# Patient Record
Sex: Female | Born: 1980 | Race: White | Hispanic: No | Smoking: Former smoker
Health system: Southern US, Community
[De-identification: ages and names within clinical notes are randomized; demographics above are authoritative.]

## PROBLEM LIST (undated history)

## (undated) DIAGNOSIS — F4310 Post-traumatic stress disorder, unspecified: Secondary | ICD-10-CM

## (undated) DIAGNOSIS — F32A Depression, unspecified: Secondary | ICD-10-CM

## (undated) DIAGNOSIS — F329 Major depressive disorder, single episode, unspecified: Secondary | ICD-10-CM

## (undated) DIAGNOSIS — F909 Attention-deficit hyperactivity disorder, unspecified type: Secondary | ICD-10-CM

## (undated) DIAGNOSIS — IMO0002 Reserved for concepts with insufficient information to code with codable children: Secondary | ICD-10-CM

## (undated) DIAGNOSIS — C801 Malignant (primary) neoplasm, unspecified: Secondary | ICD-10-CM

## (undated) DIAGNOSIS — F319 Bipolar disorder, unspecified: Secondary | ICD-10-CM

## (undated) DIAGNOSIS — F419 Anxiety disorder, unspecified: Secondary | ICD-10-CM

## (undated) DIAGNOSIS — M1990 Unspecified osteoarthritis, unspecified site: Secondary | ICD-10-CM

## (undated) DIAGNOSIS — F988 Other specified behavioral and emotional disorders with onset usually occurring in childhood and adolescence: Secondary | ICD-10-CM

## (undated) HISTORY — PX: COLONOSCOPY: SHX174

## (undated) HISTORY — PX: WISDOM TOOTH EXTRACTION: SHX21

---
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
## 2003-10-02 ENCOUNTER — Inpatient Hospital Stay (HOSPITAL_COMMUNITY): Admission: EM | Admit: 2003-10-02 | Discharge: 2003-10-06 | Payer: Self-pay | Admitting: Psychiatry

## 2003-10-09 ENCOUNTER — Emergency Department (HOSPITAL_COMMUNITY): Admission: EM | Admit: 2003-10-09 | Discharge: 2003-10-09 | Payer: Self-pay | Admitting: Emergency Medicine

## 2003-12-29 ENCOUNTER — Ambulatory Visit: Payer: Self-pay | Admitting: Family Medicine

## 2004-02-02 ENCOUNTER — Ambulatory Visit: Payer: Self-pay | Admitting: Family Medicine

## 2005-01-06 ENCOUNTER — Ambulatory Visit: Payer: Self-pay | Admitting: Psychiatry

## 2005-01-06 ENCOUNTER — Inpatient Hospital Stay (HOSPITAL_COMMUNITY): Admission: EM | Admit: 2005-01-06 | Discharge: 2005-01-09 | Payer: Self-pay | Admitting: Psychiatry

## 2005-10-29 ENCOUNTER — Ambulatory Visit: Payer: Self-pay | Admitting: Internal Medicine

## 2005-10-29 ENCOUNTER — Encounter: Payer: Self-pay | Admitting: Family Medicine

## 2005-10-29 ENCOUNTER — Inpatient Hospital Stay (HOSPITAL_COMMUNITY): Admission: EM | Admit: 2005-10-29 | Discharge: 2005-11-01 | Payer: Self-pay

## 2005-10-30 ENCOUNTER — Encounter (INDEPENDENT_AMBULATORY_CARE_PROVIDER_SITE_OTHER): Payer: Self-pay | Admitting: Cardiology

## 2005-12-10 ENCOUNTER — Ambulatory Visit: Payer: Self-pay | Admitting: Physician Assistant

## 2006-02-13 ENCOUNTER — Ambulatory Visit (HOSPITAL_COMMUNITY): Payer: Self-pay | Admitting: Psychiatry

## 2006-03-08 ENCOUNTER — Ambulatory Visit (HOSPITAL_COMMUNITY): Payer: Self-pay | Admitting: Psychiatry

## 2007-11-29 ENCOUNTER — Emergency Department (HOSPITAL_COMMUNITY): Admission: EM | Admit: 2007-11-29 | Discharge: 2007-11-29 | Payer: Self-pay | Admitting: Emergency Medicine

## 2008-09-16 ENCOUNTER — Emergency Department (HOSPITAL_COMMUNITY): Admission: EM | Admit: 2008-09-16 | Discharge: 2008-09-16 | Payer: Self-pay | Admitting: Emergency Medicine

## 2008-11-30 ENCOUNTER — Other Ambulatory Visit: Payer: Self-pay

## 2008-11-30 ENCOUNTER — Other Ambulatory Visit: Payer: Self-pay | Admitting: Emergency Medicine

## 2008-12-03 ENCOUNTER — Ambulatory Visit: Payer: Self-pay | Admitting: Psychiatry

## 2008-12-03 ENCOUNTER — Inpatient Hospital Stay (HOSPITAL_COMMUNITY): Admission: AD | Admit: 2008-12-03 | Discharge: 2008-12-14 | Payer: Self-pay | Admitting: Psychiatry

## 2009-02-16 ENCOUNTER — Ambulatory Visit (HOSPITAL_COMMUNITY): Admission: RE | Admit: 2009-02-16 | Discharge: 2009-02-16 | Payer: Self-pay | Admitting: Internal Medicine

## 2009-09-03 ENCOUNTER — Emergency Department (HOSPITAL_COMMUNITY): Admission: EM | Admit: 2009-09-03 | Discharge: 2009-09-03 | Payer: Self-pay | Admitting: Emergency Medicine

## 2009-10-06 ENCOUNTER — Emergency Department (HOSPITAL_COMMUNITY): Admission: EM | Admit: 2009-10-06 | Discharge: 2009-10-06 | Payer: Self-pay | Admitting: Emergency Medicine

## 2009-11-26 ENCOUNTER — Emergency Department (HOSPITAL_COMMUNITY): Admission: EM | Admit: 2009-11-26 | Discharge: 2009-11-26 | Payer: Self-pay | Admitting: Emergency Medicine

## 2010-04-19 LAB — COMPREHENSIVE METABOLIC PANEL
ALT: 21 U/L (ref 0–35)
Albumin: 4 g/dL (ref 3.5–5.2)
BUN: 15 mg/dL (ref 6–23)
Chloride: 105 mEq/L (ref 96–112)
Creatinine, Ser: 0.71 mg/dL (ref 0.4–1.2)
GFR calc non Af Amer: >60 mL/min (ref >60)
Potassium: 3.6 mEq/L (ref 3.5–5.1)
Sodium: 137 mEq/L (ref 135–145)
Total Protein: 6.8 g/dL (ref 6.0–8.3)

## 2010-04-19 LAB — CBC
HCT: 43.7 % (ref 36.0–46.0)
Hemoglobin: 14.7 g/dL (ref 12.0–15.0)
MCH: 27.8 pg (ref 26.0–34.0)
MCV: 82.7 fL (ref 78.0–100.0)
RBC: 5.28 MIL/uL — ABNORMAL HIGH (ref 3.87–5.11)
WBC: 9.7 10*3/uL (ref 4.0–10.5)

## 2010-04-19 LAB — DIFFERENTIAL
Eosinophils Absolute: 0.2 10*3/uL (ref 0.0–0.7)
Neutrophils Relative %: 73 % (ref 43–77)

## 2010-04-20 LAB — WET PREP, GENITAL: Trich, Wet Prep: NONE SEEN

## 2010-04-20 LAB — URINE MICROSCOPIC-ADD ON

## 2010-04-20 LAB — URINE CULTURE
Colony Count: 50000
Culture  Setup Time: 201109020536

## 2010-04-20 LAB — URINALYSIS, ROUTINE W REFLEX MICROSCOPIC
Bilirubin Urine: NEGATIVE
Hgb urine dipstick: NEGATIVE
Nitrite: NEGATIVE
Specific Gravity, Urine: 1.027 (ref 1.005–1.030)

## 2010-05-11 LAB — CBC
HCT: 42.7 % (ref 36.0–46.0)
Hemoglobin: 14.5 g/dL (ref 12.0–15.0)
MCV: 82.6 fL (ref 78.0–100.0)
Platelets: 288 10*3/uL (ref 150–400)
WBC: 8.1 10*3/uL (ref 4.0–10.5)

## 2010-05-11 LAB — RAPID URINE DRUG SCREEN, HOSP PERFORMED
Barbiturates: NOT DETECTED
Benzodiazepines: POSITIVE — AB
Cocaine: POSITIVE — AB
Opiates: NOT DETECTED

## 2010-05-11 LAB — BASIC METABOLIC PANEL
BUN: 13 mg/dL (ref 6–23)
CO2: 25 mEq/L (ref 19–32)
Calcium: 9.7 mg/dL (ref 8.4–10.5)
Chloride: 109 mEq/L (ref 96–112)
Creatinine, Ser: 0.89 mg/dL (ref 0.4–1.2)
GFR calc Af Amer: >60 mL/min (ref >60)
Glucose, Bld: 112 mg/dL — ABNORMAL HIGH (ref 70–99)
Sodium: 140 mEq/L (ref 135–145)

## 2010-05-11 LAB — DIFFERENTIAL
Basophils Relative: 1 % (ref 0–1)
Eosinophils Relative: 3 % (ref 0–5)
Lymphs Abs: 3.2 10*3/uL (ref 0.7–4.0)
Monocytes Relative: 9 % (ref 3–12)

## 2010-05-11 LAB — ACETAMINOPHEN LEVEL: Acetaminophen (Tylenol), Serum: <10 ug/mL — ABNORMAL LOW (ref 10–30)

## 2010-05-11 LAB — ETHANOL: Alcohol, Ethyl (B): <5 mg/dL (ref 0–10)

## 2010-05-13 LAB — POCT I-STAT, CHEM 8
BUN: 17 mg/dL (ref 6–23)
Calcium, Ion: 1.18 mmol/L (ref 1.12–1.32)
Hemoglobin: 17 g/dL — ABNORMAL HIGH (ref 12.0–15.0)
Sodium: 139 mEq/L (ref 135–145)
TCO2: 23 mmol/L (ref 0–100)

## 2010-05-13 LAB — URINALYSIS, ROUTINE W REFLEX MICROSCOPIC
Hgb urine dipstick: NEGATIVE
Nitrite: NEGATIVE
Protein, ur: 30 mg/dL — AB
Specific Gravity, Urine: >1.03 — ABNORMAL HIGH (ref 1.005–1.030)
Urobilinogen, UA: 0.2 mg/dL (ref 0.0–1.0)

## 2010-05-13 LAB — URINE MICROSCOPIC-ADD ON
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
## 2010-11-12 IMAGING — US US TRANSVAGINAL NON-OB
1 series · 13 of 25 positions shown · non-contrast
Comparison: Abdominal pelvic CT 10/29/2005.

CLINICAL DATA: Mid pelvic pain bilaterally for 4 days.



[Series 1: us transvaginal non-ob · 0.23mm/px · 13 of 44 slices shown]
[im 1/44]
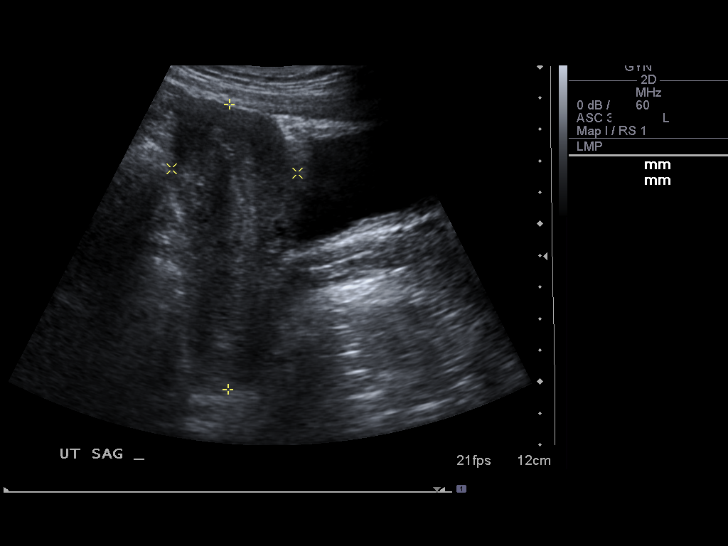
[im 4/44]
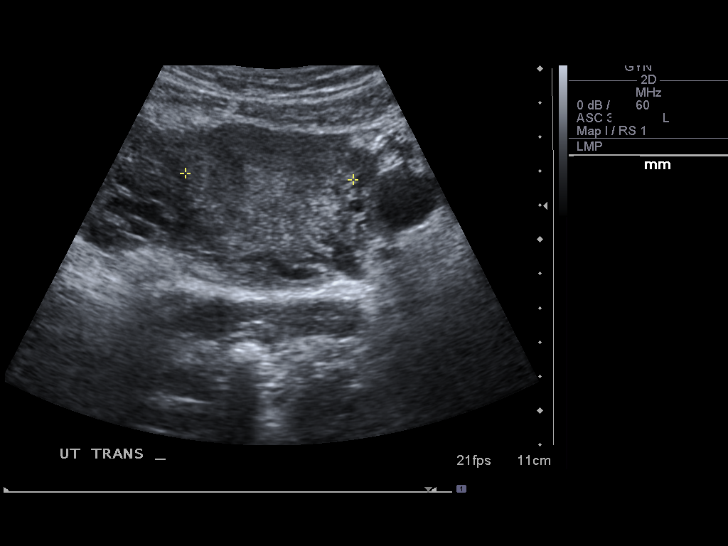
[im 8/44]
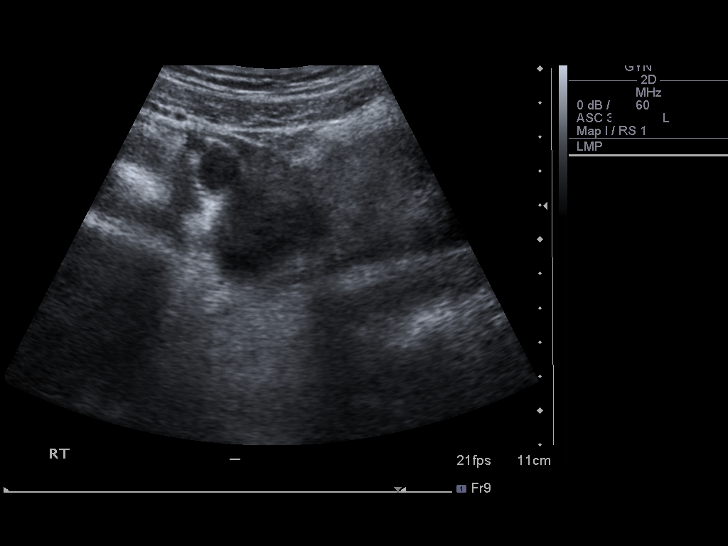
[im 11/44]
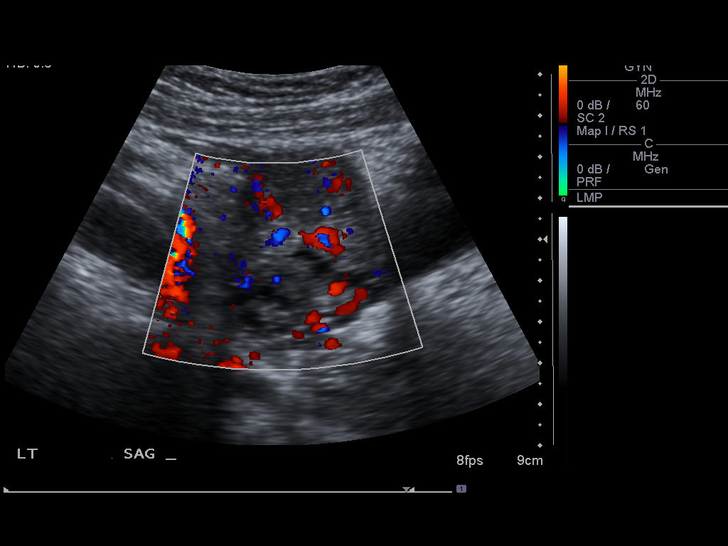
[im 15/44]
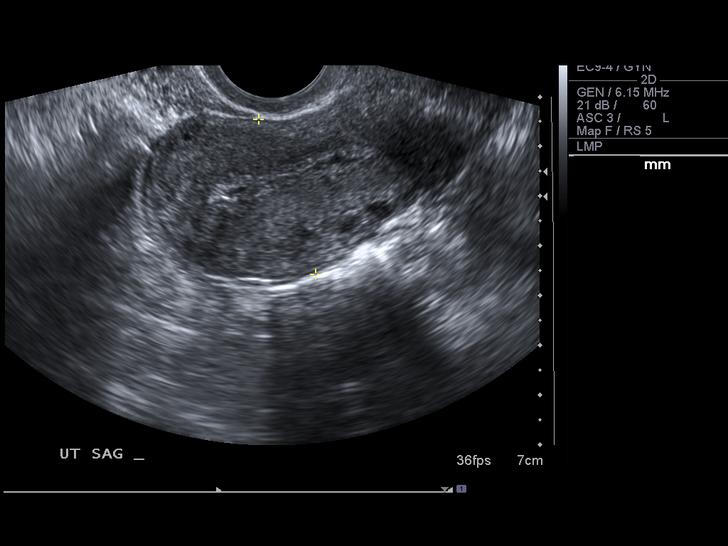
[im 18/44]
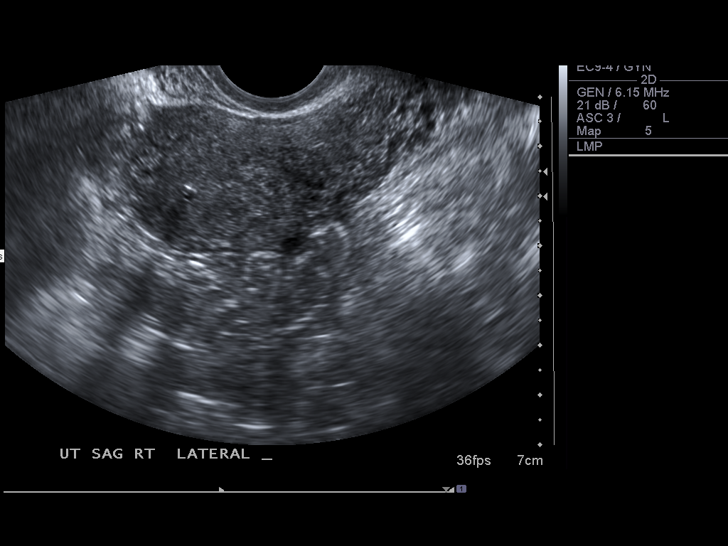
[im 22/44]
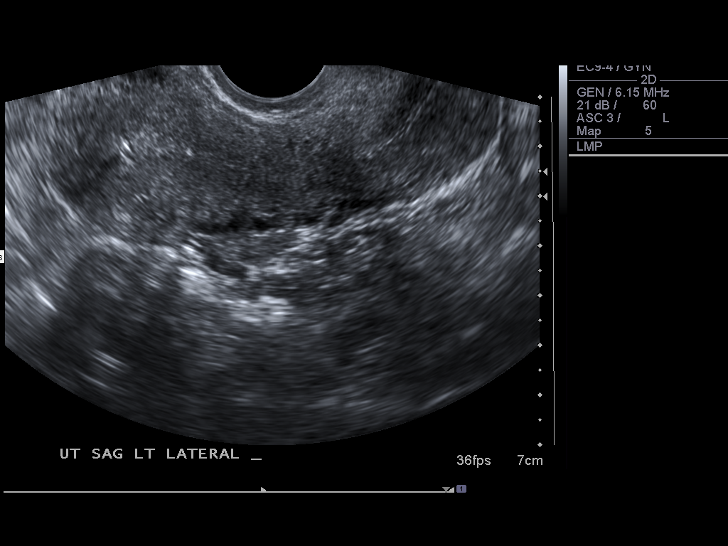
[im 26/44]
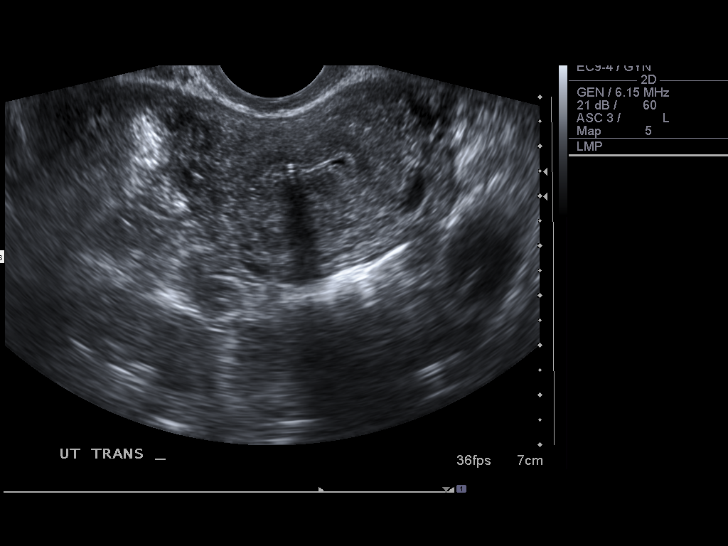
[im 29/44]
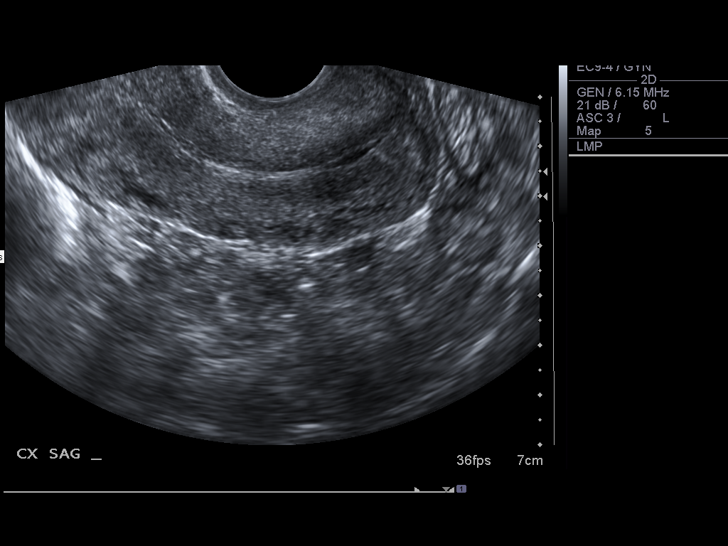
[im 33/44]
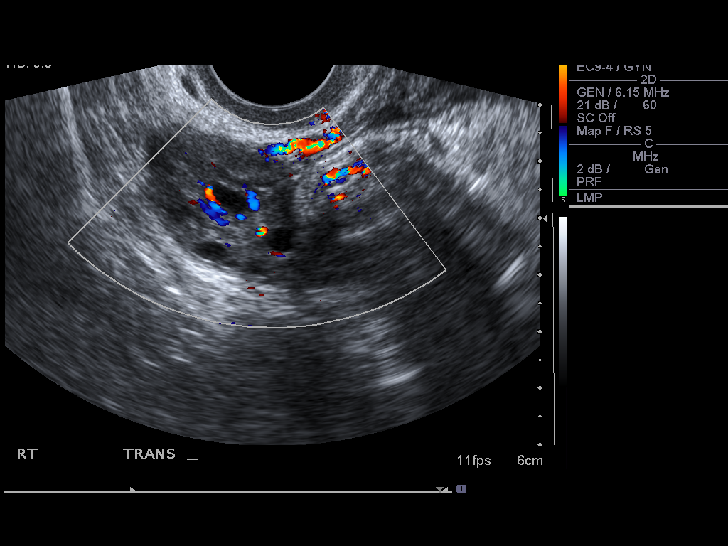
[im 36/44]
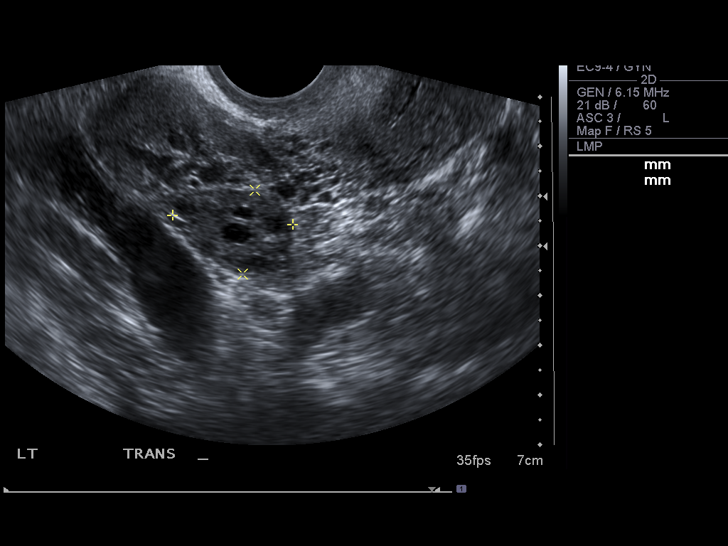
[im 40/44]
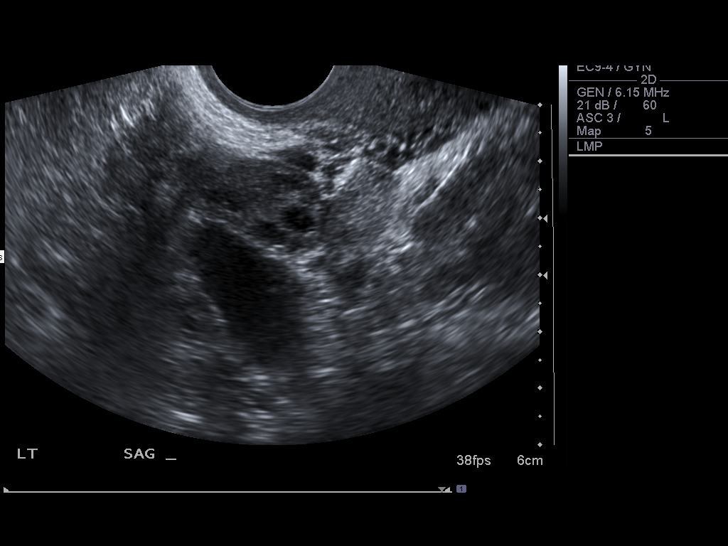
[im 44/44]
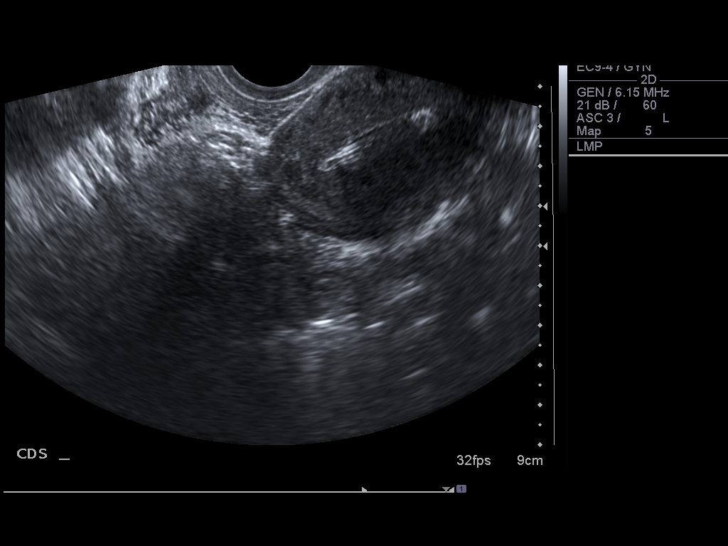

[13 of 25 positions shown; findings below may reference images not displayed]

FINDINGS: Uterus measures 9.0 x 4.0 x 4.8 cm.  The myometrium appears
homogeneous.

Endometrium measures 3 mm in thickness.  There is minimal fluid
within the endometrial canal.  The sonographer questioned the
presence of an intrauterine device.

Right Ovary appears normal, measuring 2.5 x 3.1 x 2.0 cm.  Blood
flow is demonstrated with color Doppler.

Left Ovary measures 3.6 x 1.9 x 2.0 cm and appears normal.  There
is blood flow with color Doppler.

Other Findings:  No adnexal mass is identified.  There is a small
amount of free pelvic fluid.
IMPRESSION: Normal pelvic ultrasound.  Correlate clinically regarding the
presence of an intrauterine device.

## 2011-06-25 ENCOUNTER — Ambulatory Visit (HOSPITAL_COMMUNITY)
Admission: RE | Admit: 2011-06-25 | Discharge: 2011-06-25 | Disposition: A | Discharge status: Home / Self Care | Payer: Medicaid Other | Attending: Psychiatry | Admitting: Psychiatry

## 2011-06-25 ENCOUNTER — Emergency Department (HOSPITAL_COMMUNITY)
Admission: EM | Admit: 2011-06-25 | Discharge: 2011-06-26 | Disposition: A | Discharge status: Other Acute Inpatient Hospital | Payer: 59 | Source: Home / Self Care | Attending: Emergency Medicine | Admitting: Emergency Medicine

## 2011-06-25 DIAGNOSIS — F3130 Bipolar disorder, current episode depressed, mild or moderate severity, unspecified: Secondary | ICD-10-CM | POA: Insufficient documentation

## 2011-06-25 DIAGNOSIS — F329 Major depressive disorder, single episode, unspecified: Secondary | ICD-10-CM

## 2011-06-25 DIAGNOSIS — F17200 Nicotine dependence, unspecified, uncomplicated: Secondary | ICD-10-CM | POA: Insufficient documentation

## 2011-06-25 DIAGNOSIS — R569 Unspecified convulsions: Secondary | ICD-10-CM | POA: Insufficient documentation

## 2011-06-25 DIAGNOSIS — F909 Attention-deficit hyperactivity disorder, unspecified type: Secondary | ICD-10-CM | POA: Insufficient documentation

## 2011-06-25 DIAGNOSIS — F1410 Cocaine abuse, uncomplicated: Secondary | ICD-10-CM | POA: Insufficient documentation

## 2011-06-25 DIAGNOSIS — F319 Bipolar disorder, unspecified: Secondary | ICD-10-CM | POA: Insufficient documentation

## 2011-06-25 DIAGNOSIS — F1910 Other psychoactive substance abuse, uncomplicated: Secondary | ICD-10-CM | POA: Insufficient documentation

## 2011-06-25 DIAGNOSIS — F1210 Cannabis abuse, uncomplicated: Secondary | ICD-10-CM | POA: Insufficient documentation

## 2011-06-25 HISTORY — DX: Major depressive disorder, single episode, unspecified: F32.9

## 2011-06-25 HISTORY — DX: Bipolar disorder, unspecified: F31.9

## 2011-06-25 HISTORY — DX: Other specified behavioral and emotional disorders with onset usually occurring in childhood and adolescence: F98.8

## 2011-06-25 HISTORY — DX: Attention-deficit hyperactivity disorder, unspecified type: F90.9

## 2011-06-25 HISTORY — DX: Depression, unspecified: F32.A

## 2011-06-25 HISTORY — DX: Post-traumatic stress disorder, unspecified: F43.10

## 2011-06-25 LAB — URINALYSIS, ROUTINE W REFLEX MICROSCOPIC
Glucose, UA: NEGATIVE mg/dL
Hgb urine dipstick: NEGATIVE
Specific Gravity, Urine: 1.014 (ref 1.005–1.030)
pH: 7 (ref 5.0–8.0)

## 2011-06-25 LAB — CBC
MCH: 26.9 pg (ref 26.0–34.0)
MCV: 83 fL (ref 78.0–100.0)
Platelets: 328 10*3/uL (ref 150–400)
RBC: 5.35 MIL/uL — ABNORMAL HIGH (ref 3.87–5.11)
RDW: 13.5 % (ref 11.5–15.5)
WBC: 9.4 10*3/uL (ref 4.0–10.5)

## 2011-06-25 LAB — PREGNANCY, URINE: Preg Test, Ur: NEGATIVE

## 2011-06-25 LAB — URINE MICROSCOPIC-ADD ON
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
## 2011-06-26 ENCOUNTER — Telehealth (HOSPITAL_COMMUNITY): Payer: Self-pay | Admitting: Licensed Clinical Social Worker

## 2011-06-26 ENCOUNTER — Encounter (HOSPITAL_COMMUNITY): Payer: Self-pay | Admitting: *Deleted

## 2011-06-26 ENCOUNTER — Encounter (HOSPITAL_COMMUNITY): Payer: Self-pay | Admitting: Emergency Medicine

## 2011-06-26 ENCOUNTER — Inpatient Hospital Stay (HOSPITAL_COMMUNITY)
Admission: AD | Admit: 2011-06-26 | Discharge: 2011-07-02 | DRG: 885 | Disposition: A | Discharge status: Home / Self Care | Payer: 59 | Source: Ambulatory Visit | Attending: Psychiatry | Admitting: Psychiatry

## 2011-06-26 DIAGNOSIS — F1410 Cocaine abuse, uncomplicated: Secondary | ICD-10-CM

## 2011-06-26 DIAGNOSIS — F17200 Nicotine dependence, unspecified, uncomplicated: Secondary | ICD-10-CM | POA: Diagnosis present

## 2011-06-26 DIAGNOSIS — F4310 Post-traumatic stress disorder, unspecified: Secondary | ICD-10-CM | POA: Diagnosis present

## 2011-06-26 DIAGNOSIS — F609 Personality disorder, unspecified: Secondary | ICD-10-CM | POA: Diagnosis present

## 2011-06-26 DIAGNOSIS — IMO0002 Reserved for concepts with insufficient information to code with codable children: Secondary | ICD-10-CM | POA: Diagnosis present

## 2011-06-26 DIAGNOSIS — F909 Attention-deficit hyperactivity disorder, unspecified type: Secondary | ICD-10-CM | POA: Diagnosis present

## 2011-06-26 DIAGNOSIS — F1920 Other psychoactive substance dependence, uncomplicated: Secondary | ICD-10-CM | POA: Diagnosis present

## 2011-06-26 DIAGNOSIS — F1210 Cannabis abuse, uncomplicated: Secondary | ICD-10-CM

## 2011-06-26 DIAGNOSIS — F1910 Other psychoactive substance abuse, uncomplicated: Secondary | ICD-10-CM

## 2011-06-26 DIAGNOSIS — F39 Unspecified mood [affective] disorder: Principal | ICD-10-CM | POA: Diagnosis present

## 2011-06-26 DIAGNOSIS — M1990 Unspecified osteoarthritis, unspecified site: Secondary | ICD-10-CM | POA: Diagnosis present

## 2011-06-26 DIAGNOSIS — F19939 Other psychoactive substance use, unspecified with withdrawal, unspecified: Secondary | ICD-10-CM | POA: Diagnosis present

## 2011-06-26 DIAGNOSIS — F3160 Bipolar disorder, current episode mixed, unspecified: Secondary | ICD-10-CM

## 2011-06-26 HISTORY — DX: Unspecified osteoarthritis, unspecified site: M19.90

## 2011-06-26 HISTORY — DX: Bipolar disorder, unspecified: F31.9

## 2011-06-26 HISTORY — DX: Reserved for concepts with insufficient information to code with codable children: IMO0002

## 2011-06-26 LAB — BASIC METABOLIC PANEL
Calcium: 9.5 mg/dL (ref 8.4–10.5)
Creatinine, Ser: 0.84 mg/dL (ref 0.50–1.10)
GFR calc Af Amer: >90 mL/min (ref >90)
Sodium: 136 mEq/L (ref 135–145)

## 2011-06-26 LAB — RAPID URINE DRUG SCREEN, HOSP PERFORMED
Cocaine: POSITIVE — AB
Opiates: NOT DETECTED

## 2011-06-26 MED ORDER — TRAZODONE HCL 100 MG PO TABS
100.0000 mg | ORAL_TABLET | Freq: Every evening | ORAL | Status: DC | PRN
  Administered 2011-06-26 – 2011-07-01 (×5): 100 mg via ORAL
  Filled 2011-06-26 (×5): qty 1

## 2011-06-26 MED ORDER — ALUM & MAG HYDROXIDE-SIMETH 200-200-20 MG/5ML PO SUSP
30.0000 mL | ORAL | Status: DC | PRN
  Administered 2011-07-02: 30 mL via ORAL

## 2011-06-26 MED ORDER — ADULT MULTIVITAMIN W/MINERALS CH
1.0000 | ORAL_TABLET | Freq: Every day | ORAL | Status: DC
  Administered 2011-06-27 – 2011-07-02 (×6): 1 via ORAL
  Filled 2011-06-26 (×8): qty 1

## 2011-06-26 MED ORDER — ONDANSETRON 4 MG PO TBDP: 4.0000 mg | ORAL_TABLET | Freq: Four times a day (QID) | ORAL | Status: AC | PRN

## 2011-06-26 MED ORDER — LOPERAMIDE HCL 2 MG PO CAPS
2.0000 mg | ORAL_CAPSULE | ORAL | Status: AC | PRN
Start: 2011-06-26 — End: 2011-06-29

## 2011-06-26 MED ORDER — ONDANSETRON HCL 4 MG PO TABS: 4.0000 mg | ORAL_TABLET | Freq: Three times a day (TID) | ORAL | Status: DC | PRN

## 2011-06-26 MED ORDER — NICOTINE 21 MG/24HR TD PT24: 21.0000 mg | MEDICATED_PATCH | Freq: Once | TRANSDERMAL | Status: DC

## 2011-06-26 MED ORDER — ALPRAZOLAM 0.25 MG PO TABS
0.2500 mg | ORAL_TABLET | Freq: Three times a day (TID) | ORAL | Status: DC | PRN
  Administered 2011-06-26 (×2): 0.25 mg via ORAL
  Filled 2011-06-26 (×3): qty 1

## 2011-06-26 MED ORDER — ZOLPIDEM TARTRATE 5 MG PO TABS: 5.0000 mg | ORAL_TABLET | Freq: Every evening | ORAL | Status: DC | PRN

## 2011-06-26 MED ORDER — MAGNESIUM HYDROXIDE 400 MG/5ML PO SUSP: 30.0000 mL | Freq: Every day | ORAL | Status: DC | PRN

## 2011-06-26 MED ORDER — ACETAMINOPHEN 325 MG PO TABS: 650.0000 mg | ORAL_TABLET | ORAL | Status: DC | PRN

## 2011-06-26 MED ORDER — CHLORDIAZEPOXIDE HCL 25 MG PO CAPS
25.0000 mg | ORAL_CAPSULE | Freq: Three times a day (TID) | ORAL | Status: AC
  Administered 2011-06-28 – 2011-06-29 (×3): 25 mg via ORAL
  Filled 2011-06-26 (×2): qty 1

## 2011-06-26 MED ORDER — NICOTINE 21 MG/24HR TD PT24
21.0000 mg | MEDICATED_PATCH | Freq: Every day | TRANSDERMAL | Status: DC
  Administered 2011-06-26: 21 mg via TRANSDERMAL
  Filled 2011-06-26: qty 1

## 2011-06-26 MED ORDER — VITAMIN B-1 100 MG PO TABS
100.0000 mg | ORAL_TABLET | Freq: Every day | ORAL | Status: DC
  Administered 2011-06-27 – 2011-07-02 (×6): 100 mg via ORAL
  Filled 2011-06-26 (×8): qty 1

## 2011-06-26 MED ORDER — CHLORDIAZEPOXIDE HCL 25 MG PO CAPS
25.0000 mg | ORAL_CAPSULE | Freq: Four times a day (QID) | ORAL | Status: AC | PRN
  Administered 2011-06-27 – 2011-06-28 (×2): 25 mg via ORAL
  Filled 2011-06-26 (×3): qty 1

## 2011-06-26 MED ORDER — ACETAMINOPHEN 325 MG PO TABS: 650.0000 mg | ORAL_TABLET | Freq: Four times a day (QID) | ORAL | Status: DC | PRN

## 2011-06-26 MED ORDER — IBUPROFEN 600 MG PO TABS: 600.0000 mg | ORAL_TABLET | Freq: Three times a day (TID) | ORAL | Status: DC | PRN

## 2011-06-26 MED ORDER — CHLORDIAZEPOXIDE HCL 25 MG PO CAPS
25.0000 mg | ORAL_CAPSULE | Freq: Every day | ORAL | Status: AC
  Administered 2011-07-01: 25 mg via ORAL
  Filled 2011-06-26 (×3): qty 1

## 2011-06-26 MED ORDER — HYDROXYZINE HCL 25 MG PO TABS
25.0000 mg | ORAL_TABLET | Freq: Four times a day (QID) | ORAL | Status: AC | PRN
  Administered 2011-06-26 – 2011-06-29 (×6): 25 mg via ORAL
  Filled 2011-06-26 (×5): qty 1

## 2011-06-26 MED ORDER — THIAMINE HCL 100 MG/ML IJ SOLN: 100.0000 mg | Freq: Once | INTRAMUSCULAR | Status: DC

## 2011-06-26 MED ORDER — CHLORDIAZEPOXIDE HCL 25 MG PO CAPS
25.0000 mg | ORAL_CAPSULE | Freq: Once | ORAL | Status: AC
  Administered 2011-06-26: 25 mg via ORAL
  Filled 2011-06-26: qty 1

## 2011-06-26 MED ORDER — CHLORDIAZEPOXIDE HCL 25 MG PO CAPS
25.0000 mg | ORAL_CAPSULE | Freq: Four times a day (QID) | ORAL | Status: AC
  Administered 2011-06-27 – 2011-06-28 (×5): 25 mg via ORAL
  Filled 2011-06-26 (×6): qty 1

## 2011-06-26 MED ORDER — CHLORDIAZEPOXIDE HCL 25 MG PO CAPS
25.0000 mg | ORAL_CAPSULE | ORAL | Status: AC
  Administered 2011-06-29 – 2011-06-30 (×2): 25 mg via ORAL
  Filled 2011-06-26: qty 1
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
## 2011-06-26 NOTE — ED Notes (Signed)
Up to the bathroom 

## 2011-06-26 NOTE — ED Notes (Signed)
Up to the desk to shower and change scrubs 

## 2011-06-26 NOTE — ED Notes (Addendum)
Pt aware that she has been accepted th [facility] and will transfer , more anxious, reasurred, cool cloth given, soda offered.

## 2011-06-26 NOTE — ED Notes (Signed)
Will transport when intact paper work is complete

## 2011-06-26 NOTE — ED Provider Notes (Signed)
Medical screening examination/treatment/procedure(s) were performed by non-physician practitioner and as supervising physician I was immediately available for consultation/collaboration.  Olivia Mackie, MD 06/26/11 [phone]

## 2011-06-26 NOTE — BH Assessment (Signed)
Patient accepted to [facility] by [place] to [facility] Room 307-1. EDP-Dr. Patria Mane made aware of patients disposition and agrees to discharge patient to [facility]. Writer completed patients support paperwork. Patients nurse-Janie made aware of patients disposition and agrees to discharge patient accordingly.

## 2011-06-26 NOTE — Consult Note (Signed)
Reason for Consult: Bipolar disorder and polysubstance abuse Referring Physician: Dr. Maurie Boettcher is an 31 y.o. female.  HPI: This is a 31 years old young female, who was presented to the [facility] long emergency department with the complaints of for increasing depression, anxiety and suicidal thoughts. Patient has been suffering with the crack cocaine, last use the yesterday and marijuana use was 2-3 days ago. Patient also reported she stopped taking her medication Zoloft and mood stabilizer which she cannot name about month ago. Patient was treated by a psychiatrist [facility] [place] until a month ago. Reportedly psychiatry stopped working over there and unable to found new psychiatrist/facility. Urine test was positive for the cocaine, marijuana and benzodiazepines.  Patient has a significant family history of bipolar disorder schizophrenia and polysubstance abuse. Patient has the previous hospitalizations at [facility] and also a rehabilitation services at the [facility]. Patient has been stressed about finances since he was laid off for a month ago from [employer].. Patient has 2 children ages 53 and 2 years old who were taken care of by his friends at this time. Patient also had a seizure and Friday and Saturday which required to go to the [facility]. Her one of our child was diagnosed with autism disease and added stress to her she is no help from the other family members. Patient endorses feeling to increase the irritability agitation fear worries and states he has sees having the panic attacks.  Mental status: Patient was sitting on her bed with the both legs were folded anxious worries shaking. She was cooperative with this evaluation without irritability, agitation and aggressive behaviors. Patient stated mood was am panicking her affect was appropriate and congruent. She has normal rate, rhythm and volume of speech. She has endorsed current suicidal and  denied homicidal ideation, intentions and plans. Patient has no evidence of psychotic symptoms.  Past Medical History  Diagnosis Date  . Depression   . Bipolar 1 disorder   . PTSD (post-traumatic stress disorder)   . ADD (attention deficit disorder)   . ADHD (attention deficit hyperactivity disorder)     History reviewed. No pertinent past surgical history.  Family History  Problem Relation Age of Onset  . Cancer Father     Social History:  reports that she has been smoking Cigarettes.  She does not have any smokeless tobacco history on file. She reports that she uses illicit drugs (Cocaine and Marijuana). She reports that she does not drink alcohol.  Allergies:  Allergies  Allergen Reactions  . Prednisone Hives    "can not take steroids"    Medications: I have reviewed the patient's current medications.  Results for orders placed during the hospital encounter of 06/25/11 (from the past 48 hour(s))  URINE RAPID DRUG SCREEN (HOSP PERFORMED)     Status: Abnormal   Collection Time   06/25/11 11:28 PM      Component Value Range Comment   Opiates NONE DETECTED  NONE DETECTED     Cocaine POSITIVE (*) NONE DETECTED     Benzodiazepines POSITIVE (*) NONE DETECTED     Amphetamines NONE DETECTED  NONE DETECTED     Tetrahydrocannabinol POSITIVE (*) NONE DETECTED     Barbiturates NONE DETECTED  NONE DETECTED    URINALYSIS, ROUTINE W REFLEX MICROSCOPIC     Status: Abnormal   Collection Time   06/25/11 11:28 PM      Component Value Range Comment   Color,  Urine YELLOW  YELLOW     APPearance CLOUDY (*) CLEAR     Specific Gravity, Urine 1.014  1.005 - 1.030     pH 7.0  5.0 - 8.0     Glucose, UA NEGATIVE  NEGATIVE (mg/dL)    Hgb urine dipstick NEGATIVE  NEGATIVE     Bilirubin Urine NEGATIVE  NEGATIVE     Ketones, ur NEGATIVE  NEGATIVE (mg/dL)    Protein, ur NEGATIVE  NEGATIVE (mg/dL)    Urobilinogen, UA 0.2  0.0 - 1.0 (mg/dL)    Nitrite NEGATIVE  NEGATIVE     Leukocytes, UA TRACE  (*) NEGATIVE    PREGNANCY, URINE     Status: Normal   Collection Time   06/25/11 11:28 PM      Component Value Range Comment   Preg Test, Ur NEGATIVE  NEGATIVE    URINE MICROSCOPIC-ADD ON     Status: Abnormal   Collection Time   06/25/11 11:28 PM      Component Value Range Comment   Squamous Epithelial / LPF MANY (*) RARE     WBC, UA 0-2  <3 (WBC/hpf)    Bacteria, UA MANY (*) RARE    CBC     Status: Abnormal   Collection Time   06/25/11 11:30 PM      Component Value Range Comment   WBC 9.4  4.0 - 10.5 (K/uL)    RBC 5.35 (*) 3.87 - 5.11 (MIL/uL)    Hemoglobin 14.4  12.0 - 15.0 (g/dL)    HCT 16.1  09.6 - 04.5 (%)    MCV 83.0  78.0 - 100.0 (fL)    MCH 26.9  26.0 - 34.0 (pg)    MCHC 32.4  30.0 - 36.0 (g/dL)    RDW 40.9  81.1 - 91.4 (%)    Platelets 328  150 - 400 (K/uL)   BASIC METABOLIC PANEL     Status: Abnormal   Collection Time   06/25/11 11:30 PM      Component Value Range Comment   Sodium 136  135 - 145 (mEq/L)    Potassium 4.1  3.5 - 5.1 (mEq/L)    Chloride 101  96 - 112 (mEq/L)    CO2 26  19 - 32 (mEq/L)    Glucose, Bld 102 (*) 70 - 99 (mg/dL)    BUN 7  6 - 23 (mg/dL)    Creatinine, Ser 7.82  0.50 - 1.10 (mg/dL)    Calcium 9.5  8.4 - 10.5 (mg/dL)    GFR calc non Af Amer >90  >90 (mL/min)    GFR calc Af Amer >90  >90 (mL/min)   ETHANOL     Status: Normal   Collection Time   06/25/11 11:30 PM      Component Value Range Comment   Alcohol, Ethyl (B) <11  0 - 11 (mg/dL)     No results found.  No psychosis and Positive for ADHD, anxiety, bad mood, behavior problems, bipolar, depression, excessive alcohol consumption, illegal drug usage, mood swings and sleep disturbance Blood pressure 122/72, pulse 98, temperature 97.7 F (36.5 C), temperature source Oral, resp. rate 20, SpO2 100.00%.   Assessment/Plan: Bipolar disorder most recent episode depression and anxiety Polysubstance abuse/tox screen positive for cocaine and marijuana  Recommended acute psychiatric  hospitalization for safety and stabilization of the bipolar disorder and substance abuse. Patient may needed rehabilitation services after detox treatment  Takerra Lupinacci,JANARDHAHA R. 06/26/2011, 4:22 PM

## 2011-06-26 NOTE — ED Provider Notes (Signed)
History     CSN: [id number]  Arrival date & time 06/25/11  2220   First MD Initiated Contact with Patient 06/26/11 0116      Chief Complaint  Patient presents with  . Medical Clearance    (Consider location/radiation/quality/duration/timing/severity/associated sxs/prior treatment) HPI Comments: Patient with history of depression, bipolar disorder, drug use -- presents with thoughts of harming herself by cutting herself. She has been off of her medications for one month. She has been binging on cocaine for 2 weeks. She is seeking help for these. She currently complains of no new medical problems. She states she is chronically nauseous. Nothing makes symptoms better or worse. Course of depression as been constant.   Patient is a 31 y.o. female presenting with mental health disorder. The history is provided by the patient.  Mental Health Problem  Additional symptoms of the illness do not include no headaches or no abdominal pain.    Past Medical History  Diagnosis Date  . Depression   . Bipolar 1 disorder   . PTSD (post-traumatic stress disorder)   . ADD (attention deficit disorder)   . ADHD (attention deficit hyperactivity disorder)     History reviewed. No pertinent past surgical history.  Family History  Problem Relation Age of Onset  . Cancer Father     History  Substance Use Topics  . Smoking status: Current Everyday Smoker    Types: Cigarettes  . Smokeless tobacco: Not on file  . Alcohol Use: No    OB History    Grav Para Term Preterm Abortions TAB SAB Ect Mult Living                  Review of Systems  Constitutional: Negative for fever.  HENT: Negative for sore throat and rhinorrhea.   Eyes: Negative for redness.  Respiratory: Negative for cough.   Cardiovascular: Negative for chest pain.  Gastrointestinal: Positive for nausea. Negative for vomiting, abdominal pain and diarrhea.  Genitourinary: Negative for dysuria.  Musculoskeletal: Negative for  myalgias.  Skin: Negative for rash.  Neurological: Negative for headaches.    Allergies  Prednisone  Home Medications   Current Outpatient Rx  Name Route Sig Dispense Refill  . ALPRAZOLAM 0.25 MG PO TABS Oral Take 0.25 mg by mouth at bedtime as needed.    Marland Kitchen LEVONORGESTREL 20 MCG/24HR IU IUD Intrauterine 1 each by Intrauterine route once.      BP 121/78  Pulse 85  Temp(Src) 98.5 F (36.9 C) (Oral)  Resp 18  SpO2 99%  Physical Exam  Nursing note and vitals reviewed. Constitutional: She appears well-developed and well-nourished.  HENT:  Head: Normocephalic and atraumatic.  Eyes: Conjunctivae are normal. Right eye exhibits no discharge. Left eye exhibits no discharge.  Neck: Normal range of motion. Neck supple.  Cardiovascular: Normal rate, regular rhythm and normal heart sounds.   Pulmonary/Chest: Effort normal and breath sounds normal.  Abdominal: Soft. There is no tenderness.  Neurological: She is alert.  Skin: Skin is warm and dry.  Psychiatric: She has a normal mood and affect.    ED Course  Procedures (including critical care time)  Labs Reviewed  CBC - Abnormal; Notable for the following:    RBC 5.35 (*)    All other components within normal limits  BASIC METABOLIC PANEL - Abnormal; Notable for the following:    Glucose, Bld 102 (*)    All other components within normal limits  URINE RAPID DRUG SCREEN (HOSP PERFORMED) - Abnormal; Notable for the  following:    Cocaine POSITIVE (*)    Benzodiazepines POSITIVE (*)    Tetrahydrocannabinol POSITIVE (*)    All other components within normal limits  URINALYSIS, ROUTINE W REFLEX MICROSCOPIC - Abnormal; Notable for the following:    APPearance CLOUDY (*)    Leukocytes, UA TRACE (*)    All other components within normal limits  URINE MICROSCOPIC-ADD ON - Abnormal; Notable for the following:    Squamous Epithelial / LPF MANY (*)    Bacteria, UA MANY (*)    All other components within normal limits  ETHANOL    PREGNANCY, URINE   No results found.   1. Depression   2. Drug abuse     1:24 AM Patient seen and examined. Results reviewed. Holding orders written. Will consult ACT.   Vital signs reviewed and are as follows: Filed Vitals:   06/25/11 2250  BP: 121/78  Pulse: 85  Temp: 98.5 F (36.9 C)  Resp: 18   1:28 AM Per ACT -- patient has been seen at [facility]. She is here for medical clearance. Awaiting to see if she has been accepted.    MDM  Pending clearance for [facility] placement.         [place], [place] 06/26/11 [phone]

## 2011-06-26 NOTE — ED Notes (Signed)
Transferred to b[facility], ambulatory w/o difficulty

## 2011-06-26 NOTE — ED Notes (Signed)
Pt states she is battling drug addiction and has depression  Strong family hx of mental disorders in her family  Mother committed suicide and father passed away with cancer  Pt states she has been having suicidal thoughts  Pt states she has been on a binge of crack cocaine for the past 3 weeks  Pt states she feels like something else is driving her to do stuff  Pt states she really needs and wants some help

## 2011-06-26 NOTE — Progress Notes (Signed)
Patient ID: [name], female   DOB: August 16, 1980, 31 y.o.   MRN: [id number] "I'm here to get medications adjusted". Pt stated "I love you guys, but I'm tired of seeing you", while smiling. Pt stated she was here at 31, 49, 64 yrs of age.  Stated that when her mother and father died, it was too much.

## 2011-06-26 NOTE — ED Notes (Signed)
1 large green duffle and 1 pt belongings bag locked in activity room

## 2011-06-26 NOTE — ED Notes (Signed)
1 pt belongings bag, 1 large green duffle sent w/ pt.

## 2011-06-26 NOTE — ED Notes (Signed)
Feeling better, resting quietly, watching tv, security contacted for transport

## 2011-06-26 NOTE — Tx Team (Signed)
Initial Interdisciplinary Treatment Plan  PATIENT STRENGTHS: (choose at least two) Average or above average intelligence Communication skills General fund of knowledge Motivation for treatment/growth  PATIENT STRESSORS: Legal issue Medication change or noncompliance Substance abuse   PROBLEM LIST: Problem List/Patient Goals Date to be addressed Date deferred Reason deferred Estimated date of resolution  PTSD      Substance abuse      Bipolar      ADHD                                     DISCHARGE CRITERIA:  Ability to meet basic life and health needs Adequate post-discharge living arrangements Improved stabilization in mood, thinking, and/or behavior Motivation to continue treatment in a less acute level of care Need for constant or close observation no longer present Reduction of life-threatening or endangering symptoms to within safe limits Safe-care adequate arrangements made Verbal commitment to aftercare and medication compliance Withdrawal symptoms are absent or subacute and managed without 24-hour nursing intervention  PRELIMINARY DISCHARGE PLAN: Outpatient therapy  PATIENT/FAMIILY INVOLVEMENT: This treatment plan has been presented to and reviewed with the patient, [name], and/or family member.  The patient and family have been given the opportunity to ask questions and make suggestions.  [name] Mae 06/26/2011, 6:54 PM

## 2011-06-27 DIAGNOSIS — F1410 Cocaine abuse, uncomplicated: Secondary | ICD-10-CM | POA: Diagnosis present

## 2011-06-27 DIAGNOSIS — F1920 Other psychoactive substance dependence, uncomplicated: Secondary | ICD-10-CM

## 2011-06-27 DIAGNOSIS — F39 Unspecified mood [affective] disorder: Secondary | ICD-10-CM | POA: Diagnosis present

## 2011-06-27 DIAGNOSIS — F609 Personality disorder, unspecified: Secondary | ICD-10-CM

## 2011-06-27 DIAGNOSIS — F4310 Post-traumatic stress disorder, unspecified: Secondary | ICD-10-CM

## 2011-06-27 DIAGNOSIS — F1910 Other psychoactive substance abuse, uncomplicated: Secondary | ICD-10-CM | POA: Diagnosis present

## 2011-06-27 MED ORDER — CARBAMAZEPINE ER 200 MG PO CP12: 200.0000 mg | ORAL_CAPSULE | ORAL | Status: DC

## 2011-06-27 MED ORDER — LITHIUM CARBONATE ER 300 MG PO TBCR: 300.0000 mg | EXTENDED_RELEASE_TABLET | Freq: Two times a day (BID) | ORAL | Status: DC

## 2011-06-27 MED ORDER — CARBAMAZEPINE ER 200 MG PO TB12
200.0000 mg | ORAL_TABLET | ORAL | Status: DC
  Administered 2011-06-27 – 2011-06-28 (×3): 200 mg via ORAL
  Filled 2011-06-27 (×8): qty 1

## 2011-06-27 MED ORDER — OLANZAPINE 2.5 MG PO TABS
2.5000 mg | ORAL_TABLET | Freq: Every day | ORAL | Status: DC
  Administered 2011-06-27: 2.5 mg via ORAL
  Filled 2011-06-27 (×3): qty 1

## 2011-06-27 NOTE — Progress Notes (Signed)
Patient ID: [name], female   DOB: 1980/05/21, 31 y.o.   MRN: [id number] She has been  [place] not as med seeking this afternoon after new medication were started. Had c/o nausea this AM.
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
## 2011-06-27 NOTE — Progress Notes (Signed)
Pt attended discharge planning group and actively participated.  Pt presents with flat affect and depressed mood.  Pt was open with sharing reason for entering the hospital.  Pt states that she has been using cocaine and marijuana to deal with her life stressors.  Pt states that she has bipolar disorder and was feeling suicidal.  Pt states that she was going to [facility] the [place] for medication management but stopped going 2.5 months ago when the dr resigned.  Pt states that she has been off her medication for the 2.5 months.  Pt states that she wants to get stabilized back on her medication and assistance with finding a new provider.  Pt states that she recently moved to [facility] [place] [facility] with her kids.  Pt states that has transportation home.  SW will refer pt to [facility] for medication management and therapy.  No further needs voiced by pt at this time.  Safety planning and suicide prevention discussed.    Reyes Ivan, [place] 06/27/2011  9:38 AM

## 2011-06-27 NOTE — Progress Notes (Signed)
Patient ID: [name], female   DOB: 1980/10/24, 31 y.o.   MRN: [id number]  Pt approached the writer today, and asked if she could get her meds. "My fiance just left and he's my support system. When he leaves I get anxious. Do you understand that, while smiling" Pt's affect and mood was not congruent with that of an anxious person. Writer stated, "No. I don't understand. My reason is that you've been here several times and know that you're safe, and you no our routine. I really think you're using it as an excuse to take a pill." Pt smiled and continued to convince the writer of her anxiety. Pt was given prn vistaril.
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
## 2011-06-27 NOTE — Progress Notes (Signed)
[facility] Group Notes:  (Counselor/Nursing/MHT/Case Management/Adjunct)  06/27/2011 5:31 PM  Type of Therapy:  Group Therapy at 1:15  Participation Level:  Active  Participation Quality:  Attentive and Sharing  Affect:  Appropriate  Cognitive:  Alert and Oriented  Insight:  Limited  Engagement in Group:  Good  Engagement in Therapy:  Limited  Modes of Intervention:  Clarification, Orientation and Support  Summary of Progress/Problems:  [name] was attentive and sharing through out group and contributed several feelings that may be behind anger such as fear, guilt, and shame. Patient shared that she had 'anger is a secondary emotion and anger turned inward is depression' which provided good discussion. "I am usually with men who have anger problems but I don't I just stuff it."    Clide Dales 06/27/2011, 5:47 PM
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
## 2011-06-28 LAB — TSH: TSH: 2.655 u[IU]/mL (ref 0.350–4.500)

## 2011-06-28 LAB — HEMOGLOBIN A1C: Mean Plasma Glucose: 105 mg/dL (ref <117)

## 2011-06-28 LAB — LIPID PANEL
Total CHOL/HDL Ratio: 3 RATIO
VLDL: 14 mg/dL (ref 0–40)

## 2011-06-28 MED ORDER — NICOTINE 21 MG/24HR TD PT24
21.0000 mg | MEDICATED_PATCH | Freq: Every day | TRANSDERMAL | Status: DC
  Administered 2011-06-28 – 2011-07-02 (×5): 21 mg via TRANSDERMAL
  Filled 2011-06-28 (×7): qty 1

## 2011-06-28 MED ORDER — LITHIUM CARBONATE ER 300 MG PO TBCR
300.0000 mg | EXTENDED_RELEASE_TABLET | Freq: Two times a day (BID) | ORAL | Status: DC
  Administered 2011-06-29 – 2011-07-02 (×7): 300 mg via ORAL
  Filled 2011-06-28 (×3): qty 1
  Filled 2011-06-28 (×2): qty 6
  Filled 2011-06-28 (×6): qty 1

## 2011-06-28 NOTE — Progress Notes (Signed)
[facility] Group Notes:  (Counselor/Nursing/MHT/Case Management/Adjunct)  06/28/2011 11:55 AM  Type of Therapy:  Group Therapy  Participation Level:  Did Not Attend   Mance Vallejo C 06/28/2011, 11:55 AM  

## 2011-06-28 NOTE — Progress Notes (Signed)
Pt has participated in some activities today, spoke about feeling some depression, and also spoke about wanting to go to outpatient therapy, pt has received medications today without incident, support provided, will continue to monitor

## 2011-06-28 NOTE — Progress Notes (Signed)
Nutrition Note  Pt triggered on Nutrition Risk report for weight loss.  Ht:  5'2"  Wt:  149# IBW:  110# 135%IBW UBW: 178-185# at the end of January 2013-beginning of February 2013  Pt reports increased stress, grief, anxiety.  She would eat a good meal but then vomit (unintentional).  Took Marijuana to help with eating.  Took Cocaine to "not feel".  Pt reports skin problems and hair loss as a result of poor nutrition.  Weight loss of 30-35# in the past 3 months.  Currently on a Regular diet with MVI and thiamine.  Very good po.  Discussed healthy nutrition with patient and answered questions about vitamins.    No needs at this time.  Consult if needed  Oran Rein, [place] [phone]

## 2011-06-28 NOTE — Progress Notes (Deleted)
[facility] Group Notes:  (Counselor/Nursing/MHT/Case Management/Adjunct)  06/28/2011 2:00 PM  Type of Therapy:  Psyco/Edu Group  Participation Level:  Did Not Attend    [name] 06/28/2011, 2:00 PM

## 2011-06-28 NOTE — Progress Notes (Signed)
Pt denies SI/HI/AVH. Pt rates her depression 5, hopelessness 9, and anxiety 9. Pt visible on the unit and attended groups today. Support and encouragement offered. Pt receptive.

## 2011-06-28 NOTE — Progress Notes (Signed)
[facility] Group Notes:  (Counselor/Nursing/MHT/Case Management/Adjunct)  06/28/2011 2:00 PM  Type of Therapy:  Psycho/Edu Group  Participation Level:  Minimal  Participation Quality:  Attentive and Sharing  Affect:  Depressed  Cognitive:  Appropriate  Insight:  Limited  Engagement in Group:  Limited  Engagement in Therapy:  Limited  Modes of Intervention:  Clarification, Orientation, Problem-solving and Support  Summary of Progress/Problems:  Therapist prompted Patients to identify activities the would like to participate in but would not be able to do so under the influence of drugs or alcohol.  Therapist opened the discussion of the importance of developing and maintaining a balanced lifestyle.  Pt actively participated by disclosing what would contribute to a balance among the Physical, Mental, Emotional, and Spiritual.  Pt gave positive suggestions and was receptive to feedback.  Therapist offered support and encouragement.    Marni Griffon C 06/28/2011, 2:00 PM

## 2011-06-28 NOTE — Progress Notes (Signed)
06/28/2011         Time: 1415      Group Topic/Focus: The focus of this group is on discussing various styles of communication and communicating assertively using 'I' (feeling) statements.  Participation Level: Active  Participation Quality: Appropriate  Affect: Appropriate  Cognitive: Oriented  Additional Comments: Patient reports she would always put on a fake smile and go about her day, never telling people how she felt and why. Patient also reports self-medicating to deal with her fiance's anxiety. Patient says prior to admission she was able to communicate with her fiance and they are considering family therapy upon her discharge.  [name] 06/28/2011 3:47 PM

## 2011-06-28 NOTE — Progress Notes (Signed)
Pt did not attend d/c planning group on this date.  SW met with pt individually at this time.  Pt presents with flat affect and depressed mood.  Pt ranks depression at a 2 and anxiety at a 5 today.  Pt denies SI.  Pt states that she has a headache today and just wants to rest.  Pt states that she feels ready to d/c in the next 2 days.  Pt states that she does not want to go to rehab because she has 2 kids she has to take care of.  Pt states that her friend is currently caring for them while she is here.  Pt would prefer to follow up outpatient.  SW will refer [facility] to [facility] for medication management and therapy.  No further needs voiced by pt at this time.    Reyes Ivan, LCSWA 06/28/2011  11:22 AM

## 2011-06-29 MED ORDER — METHOCARBAMOL 500 MG PO TABS
500.0000 mg | ORAL_TABLET | Freq: Four times a day (QID) | ORAL | Status: DC | PRN
  Administered 2011-06-29 – 2011-07-01 (×7): 500 mg via ORAL
  Filled 2011-06-29: qty 12
  Filled 2011-06-29 (×7): qty 1
  Filled 2011-06-29: qty 2

## 2011-06-29 MED ORDER — LIDOCAINE 5 % EX PTCH
1.0000 | MEDICATED_PATCH | Freq: Every day | CUTANEOUS | Status: DC
Start: 2011-06-30 — End: 2011-06-30
  Filled 2011-06-29 (×3): qty 1

## 2011-06-29 NOTE — Progress Notes (Signed)
[facility] MD Progress Note  06/29/2011 3:11 PM  S/O: Patient seen and evaluated. Chart reviewed. Patient stated that her mood was "anxious". Her affect was mood congruent.  C/o paranoia and decreased sleep. She denied any current thoughts of self injurious behavior, suicidal ideation or homicidal ideation. No report of AVH. Thought process was linear and goal directed. Mild psychomotor agitation still noted. Speech was normal rate, tone and volume. Eye contact was good. Judgment and insight are limited. Patient has been up and engaged on the unit. No acute safety concerns reported from team. Nevertheless, pt has a longstanding hx of mood instability, 6 reported past suicide attempts and mother with schizophrenia who did die by suicide.  Willing to continue tx with Lithium and start Remeron for improved sleep efficiency.   Sleep:  Number of Hours: 6.75    Vital Signs:Blood pressure 130/86, pulse 93, temperature 98.5 F (36.9 C), temperature source Oral, resp. rate 18, height 5\' 2"  (1.575 m), weight 67.586 kg (149 lb).  Current Medications:   . chlordiazePOXIDE  25 mg Oral TID   Followed by  . chlordiazePOXIDE  25 mg Oral BH-qamhs   Followed by  . chlordiazePOXIDE  25 mg Oral Daily  . lidocaine  1 patch Transdermal Q breakfast  . lithium carbonate  300 mg Oral Q12H  . mulitivitamin with minerals  1 tablet Oral Daily  . nicotine  21 mg Transdermal Daily  . thiamine  100 mg Intramuscular Once  . thiamine  100 mg Oral Daily  . DISCONTD: carbamazepine  200 mg Oral BH-qamhs  . DISCONTD: OLANZapine  2.5 mg Oral QHS    Lab Results:  Results for orders placed during the hospital encounter of 06/26/11 (from the past 48 hour(s))  HEMOGLOBIN A1C     Status: Normal   Collection Time   06/28/11  6:20 AM      Component Value Range Comment   Hemoglobin A1C 5.3  <5.7 (%)    Mean Plasma Glucose 105  <117 (mg/dL)   TSH     Status: Normal   Collection Time   06/28/11  6:20 AM      Component Value Range  Comment   TSH 2.655  0.350 - 4.500 (uIU/mL)   T4, FREE     Status: Normal   Collection Time   06/28/11  6:20 AM      Component Value Range Comment   Free T4 0.80  0.80 - 1.80 (ng/dL)   T3, FREE     Status: Normal   Collection Time   06/28/11  6:20 AM      Component Value Range Comment   T3, Free 3.2  2.3 - 4.2 (pg/mL)   LIPID PANEL     Status: Normal   Collection Time   06/28/11  6:20 AM      Component Value Range Comment   Cholesterol 142  0 - 200 (mg/dL)    Triglycerides 71  <161 (mg/dL)    HDL 48  >09 (mg/dL)    Total CHOL/HDL Ratio 3.0      VLDL 14  0 - 40 (mg/dL)    LDL Cholesterol 80  0 - 99 (mg/dL)     Physical Findings: CIWA:  CIWA-Ar Total: 2  COWS:  COWS Total Score: 2   A/P: Polysubstance Dependence (Cocaine, Ecstasy & Cannabis); Benzodiazepine W/D; PD NOS with Cluster B Traits; PTSD; Mood Disorder NOS; BPAD, per Hx   Will continue Lithium 300mg  CR po bid.  Discontinued Trazodone and initiated  Remeron for sleep per pt request.    Medication education completed. Pros, cons, risks, potential side effects and benefits were discussed with pt. Pt agreeable with the plan. See orders. Discussed with team.   Peer review completed with Dr. Andria Meuse, care authorized through Monday.  Lupe Carney 06/29/2011, 3:11 PM

## 2011-06-29 NOTE — Progress Notes (Signed)
Pt has been up and has been visible in milieu, limited interaction or participation in the milieu, has endorsed feelings of depression and anxiety today and has had some complaints of pain in her back, pt did state that she did not sleep well last night, has received all medications without incident, support provided, will continue to monitor

## 2011-06-29 NOTE — Progress Notes (Signed)
[facility] Adult Inpatient Family/Significant Other Suicide Prevention Education  Suicide Prevention Education:  Education Completed;  [name], boyfriend, [phone], has been identified by the patient as the family member/significant other with whom the patient will be residing, and identified as the person(s) who will aid the patient in the event of a mental health crisis (suicidal ideations/suicide attempt).  With written consent from the patient, the family member/significant other has been provided the following suicide prevention education, prior to the and/or following the discharge of the patient.  The suicide prevention education provided includes the following:  Suicide risk factors  Suicide prevention and interventions  National Suicide Hotline telephone number  [facility] assessment telephone number  [facility] Emergency Assistance 911  [facility] and/or Residential Mobile Crisis Unit telephone number  Request made of family/significant other to:  Remove weapons (e.g., guns, rifles, knives), all items previously/currently identified as safety concern.    Remove drugs/medications (over-the-counter, prescriptions, illicit drugs), all items previously/currently identified as a safety concern.  The family member/significant other verbalizes understanding of the suicide prevention education information provided.  The family member/significant other agrees to remove the items of safety concern listed above.  Christen Butter 06/29/2011, 8:41 AM

## 2011-06-29 NOTE — Progress Notes (Signed)
Lying quietly in bed with eyes closed.  Q 15 minute checks are being conducted to maintain safety.

## 2011-06-29 NOTE — Tx Team (Signed)
Interdisciplinary Treatment Plan Update (Adult)  Date:  06/29/2011  Time Reviewed:  10:00 AM   Progress in Treatment: Attending groups: Yes Participating in groups:  Yes Taking medication as prescribed: Yes Tolerating medication:  Yes Family/Significant othe contact made:  Counselor assessing for appropriate contact Patient understands diagnosis:  Yes Discussing patient identified problems/goals with staff:  Yes Medical problems stabilized or resolved:  Yes Denies suicidal/homicidal ideation: Yes Issues/concerns per patient self-inventory:  None identified Other: N/A  New problem(s) identified: None Identified  Reason for Continuation of Hospitalization: Anxiety Depression Medication stabilization Withdrawal symptoms  Interventions implemented related to continuation of hospitalization: mood stabilization, medication monitoring and adjustment, group therapy and psycho education, safety checks q 15 mins  Additional comments: N/A  Estimated length of stay: 3-5 days  Discharge Plan: Pt will follow up at [facility] for medication management and therapy  New goal(s): N/A  Review of initial/current patient goals per problem list:    1.  Goal(s): Address substance use  Met:  No  Target date: by discharge  As evidenced by: completing detox protocol and refer to appropriate treatment  2.  Goal (s): Reduce depressive symptoms  Met:  No  Target date: by discharge  As evidenced by: Reducing depression from a 10 to a 3 as reported by pt.    3.  Goal(s): Reduce anxiety symptoms  Met:  No  Target date: by discharge  As evidenced by: Reducing anxiety from a 10 to a 3 as reported by pt.     Attendees: Patient:  [name] 06/29/2011 10:02 AM   Family:     Physician:  Lupe Carney, DO 06/29/2011 10:00 AM   Nursing: Berneice Heinrich, RN 06/29/2011 10:00 AM   Case Manager:  Reyes Ivan, LCSWA 06/29/2011  10:00 AM   Counselor: Marni Griffon, LCAS 06/29/2011   10:00 AM   Other:  Richelle Ito, LCSW 06/29/2011 10:00 AM   Other:     Other:     Other:      Scribe for Treatment Team:   Reyes Ivan 06/29/2011 10:00 AM

## 2011-06-29 NOTE — Progress Notes (Signed)
[facility] Group Notes:  (Counselor/Nursing/MHT/Case Management/Adjunct)  06/29/2011 1:00 PM  Type of Therapy:  Psych/Edu Group Therapy  Participation Level:  Did not attend    Cherisa Brucker C 06/29/2011, 2:00 PM  

## 2011-06-29 NOTE — Progress Notes (Signed)
[facility] Group Notes:  (Counselor/Nursing/MHT/Case Management/Adjunct)  06/29/2011 11:00 AM  Type of Therapy:  Group Therapy  Participation Level:  Minimal  Participation Quality:  Appropriate  Affect:  Depressed  Cognitive:  Alert and Oriented  Insight:  Limited  Engagement in Group:  Limited  Engagement in Therapy:  Limited  Modes of Intervention:  Clarification, Orientation, Problem-solving and Support  Summary of Progress/Problems:  Therapist prompted Patients to identify barriers to recovery.  Patients engaged in a heated discussion which affirmed that until a person makes decides on their own and usually due to severe consequences of their addiction, not treatment will keep a person from using AOD.  One patient began using some negative language, that offended another patient.  Therapist explained that being respectful was a recovery attitude and asked that the objectionable language be stopped. Therapist encouraged patients to avoid conflict while in treatment as emotions are raw.  Pt expressed feelings and gave excellent feedback from personal experience.  Therapist offered support and encouragement.    Marni Griffon C 06/29/2011, 12:00 PM

## 2011-06-29 NOTE — Progress Notes (Deleted)
[facility] Group Notes:  (Counselor/Nursing/MHT/Case Management/Adjunct)  06/29/2011 11:00 AM  Type of Therapy:  Group Therapy  Participation Level:  Minimal  Participation Quality:  Attentive and Sharing  Affect:  Depressed  Cognitive:  Appropriate  Insight:  Limited  Engagement in Group:  Limited  Engagement in Therapy:  Limited  Modes of Intervention:  Clarification, Orientation, Problem-solving and Support  Summary of Progress/Problems:  Therapist prompted Patients to identify barriers to recovery.  Patients engaged in a heated discussion which affirmed that until a person makes decides on their own and usually due to severe consequences of their addiction, not treatment will keep a person from using AOD.  One patient began using some negative language, that offended this patient.  She asked if the language could stop.  Pt left group before this issue was resolved.   Therapist offered support and encouragement.    Marni Griffon C 06/29/2011, 12:00 PM
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
## 2011-06-29 NOTE — Progress Notes (Signed)
S/O: Patient seen and evaluated. Chart reviewed. Patient stated that her mood was "not good". Her affect was mood congruent and anxious. C/o paranoia, She denied any current thoughts of self injurious behavior, suicidal ideation or homicidal ideation. No report of AVH. Thought process was linear and goal directed. Mild psychomotor agitation noted. Speech was normal rate, tone and volume. Eye contact was good. Judgment and insight are limited. Patient has been up and engaged on the unit. No acute safety concerns reported from team.  Nevertheless, pt has a longstanding hx of mood instability, 6 reported past suicide attempts and mother with schizophrenia who did die by suicide.  A/P: Polysubstance Dependence (Cocaine, Ecstasy & Cannabis); Benzodiazepine W/D; PD NOS with Cluster B Traits; PTSD; Mood Disorder NOS; BPAD, per Hx   Pt started on Lithium 300mg  CR po bid. Discontinued Tegretol that was too sedating. Medication education completed.  Pros, cons, risks, potential side effects and benefits were discussed with pt.  Pt agreeable with the plan.  See orders.  Discussed with team.

## 2011-06-30 DIAGNOSIS — F1910 Other psychoactive substance abuse, uncomplicated: Secondary | ICD-10-CM

## 2011-06-30 DIAGNOSIS — F39 Unspecified mood [affective] disorder: Principal | ICD-10-CM

## 2011-06-30 LAB — CBC
Platelets: 286 10*3/uL (ref 150–400)
RDW: 13.3 % (ref 11.5–15.5)
WBC: 5.7 10*3/uL (ref 4.0–10.5)

## 2011-06-30 LAB — COMPREHENSIVE METABOLIC PANEL
AST: 20 U/L (ref 0–37)
Albumin: 3.8 g/dL (ref 3.5–5.2)
Chloride: 103 mEq/L (ref 96–112)
Creatinine, Ser: 0.89 mg/dL (ref 0.50–1.10)
Potassium: 4.3 mEq/L (ref 3.5–5.1)
Total Bilirubin: 0.3 mg/dL (ref 0.3–1.2)
Total Protein: 6.7 g/dL (ref 6.0–8.3)

## 2011-06-30 LAB — DIFFERENTIAL
Basophils Absolute: 0.1 10*3/uL (ref 0.0–0.1)
Lymphocytes Relative: 40 % (ref 12–46)
Lymphs Abs: 2.3 10*3/uL (ref 0.7–4.0)
Monocytes Relative: 9 % (ref 3–12)

## 2011-06-30 LAB — CARBAMAZEPINE LEVEL, TOTAL: Carbamazepine Lvl: 3.4 ug/mL — ABNORMAL LOW (ref 4.0–12.0)

## 2011-06-30 MED ORDER — LIDOCAINE 5 % EX PTCH
1.0000 | MEDICATED_PATCH | Freq: Every day | CUTANEOUS | Status: DC
  Administered 2011-06-30 – 2011-07-02 (×3): 1 via TRANSDERMAL
  Filled 2011-06-30: qty 3
  Filled 2011-06-30 (×4): qty 1

## 2011-06-30 MED ORDER — MIRTAZAPINE 15 MG PO TBDP
15.0000 mg | ORAL_TABLET | Freq: Every evening | ORAL | Status: DC | PRN
  Administered 2011-06-30 – 2011-07-01 (×3): 15 mg via ORAL
  Filled 2011-06-30: qty 6
  Filled 2011-06-30 (×3): qty 1
  Filled 2011-06-30: qty 6
  Filled 2011-06-30 (×4): qty 1

## 2011-06-30 NOTE — Progress Notes (Signed)
Pt is quiet but appropriate this shift. Minimal group participation.  Reports poor sleep,low energy, and poor ability to pat attention.  C/O anxiety and support offered.  Multiple withdrawal symptoms reported. Rates depression and hopelessness at 9. "Off and on" SI" and contracts for safety.  Support and encouragement given and 15' checks cont for safety.
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
## 2011-06-30 NOTE — Progress Notes (Signed)
Patient ID: [name], female   DOB: [birth year], 30 y.o.   MRN: [id number] Pt requests Trazodone for sleep; med given as ordered.

## 2011-06-30 NOTE — Progress Notes (Signed)
Patient ID: [name], female   DOB: 03/07/1980, 31 y.o.   MRN: [id number] Pt with dull, flat affect endorsing depression. Pt with med-seeking behaviors repeatedly asking for any and all prn meds available. Pt also asking for start times and frequency of pain patches which are to start in am. Pt c/o anxiety and depression, but is frequently laughing soon after leaving medication window. Pt denies SI/HI. No s/s of distress noted.

## 2011-06-30 NOTE — Progress Notes (Signed)
Patient ID: [name], female   DOB: 13-Apr-1980, 31 y.o.   MRN: [id number] Pt remains awake but appears drowsy; med taking effect at this time.

## 2011-06-30 NOTE — Progress Notes (Signed)
  [name] is a 31 y.o. female [id number] 06/01/1980  06/26/2011 Principal Problem:  *Mood disorder Active Problems:  Cocaine abuse  Polysubstance abuse   Mental Status: Alert & oriented mood is easy to escalate. Denies active SI/HI/AVH.    Subjective/Objective: Spontaneously reports several traumas that have caused her PTSD.Asks for Adderall to help her focus as it has helped in the past and it might help her stay in the group. Gets a lot from group but it also triggers her anxiety.Wants the Lidocaine patch now can't wait until 2pm.  Filed Vitals:   06/30/11 0651  BP: 102/69  Pulse: 69  Temp: 97.6 F (36.4 C)  Resp:     Lab Results:   BMET    Component Value Date/Time   NA 137 06/30/2011 0711   K 4.3 06/30/2011 0711   CL 103 06/30/2011 0711   CO2 28 06/30/2011 0711   GLUCOSE 92 06/30/2011 0711   BUN 15 06/30/2011 0711   CREATININE 0.89 06/30/2011 0711   CALCIUM 9.3 06/30/2011 0711   GFRNONAA 87* 06/30/2011 0711   GFRAA >90 06/30/2011 0711    Medications:  Scheduled:     . chlordiazePOXIDE  25 mg Oral BH-qamhs   Followed by  . chlordiazePOXIDE  25 mg Oral Daily  . lidocaine  1 patch Transdermal Q breakfast  . lithium carbonate  300 mg Oral Q12H  . mulitivitamin with minerals  1 tablet Oral Daily  . nicotine  21 mg Transdermal Daily  . thiamine  100 mg Intramuscular Once  . thiamine  100 mg Oral Daily  . DISCONTD: lidocaine  1 patch Transdermal Q breakfast     PRN Meds acetaminophen, alum & mag hydroxide-simeth, chlordiazePOXIDE, hydrOXYzine, loperamide, magnesium hydroxide, methocarbamol, ondansetron, traZODone  Plan:Start Remeron to help with sleep.          Provided with web site [facility]  [name],[name]. 06/30/2011

## 2011-06-30 NOTE — Progress Notes (Signed)
Patient ID: [name], female   DOB: 08-28-1980, 31 y.o.   MRN: [id number] Pt c/o anxiety; Vistaril given PRN as ordered. Pt also c/o neck pain; Robaxin given PRN as ordered.

## 2011-06-30 NOTE — Progress Notes (Signed)
Patient ID: [name], female   DOB: 1980-08-12, 31 y.o.   MRN: [id number] Pt with decrease in anxiety; pt also states pain less. Meds effective.

## 2011-06-30 NOTE — Progress Notes (Signed)
Pt is c/o constipation with no stated relief from MOM. States she has informed provider. Refused evening meal.  Warm prune juice given. Instructed to inform staff of results.

## 2011-06-30 NOTE — Progress Notes (Signed)
Pt has been flat in affect and depressed in mood. Pt is still experiencing muscle spasms that is relieved with robaxin. Pt also expresses some difficulty sleeping. Pt has been administered her initial dose of Remeron with no effect. Pt has recently been given her repeat dose of Remeron. If unsuccessful pt has been informed of her option of having trazodone 100 mg still available. Pt receptive to the information. Pt still reports no BM to this Clinical research associate. Pt has been given prune juice to help stimulate bowels. No results at this time. Continued support and availability as needed has been extended to this pt. Pt safety remains with q34min checks.

## 2011-07-01 MED ORDER — HYDROXYZINE HCL 50 MG PO TABS
50.0000 mg | ORAL_TABLET | Freq: Three times a day (TID) | ORAL | Status: DC | PRN
  Administered 2011-07-01 – 2011-07-02 (×3): 50 mg via ORAL
  Filled 2011-07-01 (×2): qty 1

## 2011-07-01 MED ORDER — MAGNESIUM CITRATE PO SOLN
1.0000 | Freq: Once | ORAL | Status: AC
  Administered 2011-07-01: 1 via ORAL

## 2011-07-01 NOTE — Progress Notes (Signed)
Report received from C. Derrell Lolling RM. Writer entered patients room and observed her lying in bed asleep with eyes closed and resp. even and unlabored, no distress noted. Safety maintained on unit, will continue to monitor.

## 2011-07-01 NOTE — Progress Notes (Signed)
This pt has been constipated for the past 2 days. Pt reports having a bowel movment this afternoon along with some N&V. Pt is also experiencing some anxiety related to her withdrawals. Her anxiety has been controled effectively with a prn vistaril. Pt attended this evenings AA group. Continued support and avialabity as needed has been extended to this pt. Pt remains safe with q6min checks.

## 2011-07-01 NOTE — Progress Notes (Signed)
Patient ID: [name], female   DOB: Oct 16, 1980, 31 y.o.   MRN: [id number]   [facility] Group Notes:  (Counselor/Nursing/MHT/Case Management/Adjunct)  07/01/2011 1:15 PM  Type of Therapy:  Group Therapy, Dance/Movement Therapy   Participation Level:  Active  Participation Quality:  Appropriate and Attentive  Affect:  Appropriate  Cognitive:  Appropriate  Insight:  Good  Engagement in Group:  Good  Engagement in Therapy:  Good  Modes of Intervention:  Clarification, Problem-solving, Role-play, Socialization and Support  Summary of Progress/Problems: Therapist discussed letting go of addiction and adapting new and effective coping skills. Therapist invited group members to share negative things they would be leaving behind once they were discharged and modeled activity and drawing a hand on a sheet of paper and writ ng a healthy support or coping skill on each finger. Pt. shared that she would like to leave behind the blame of her mother's suicide once she discharged. During a verbal altercation between two other group members, pt. was visibly uncomfortable but was able to remain in group.     Gevena Mart
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
## 2011-07-01 NOTE — Progress Notes (Signed)
Pt has been up and did attend and participate in various milieu activities today, spoke about how her current medication regimen is not agreeing with her and she does not like the way it makes her feel, spoke about having feelings of depression and anxiety and has felt agitated during the day today, pt spoke about wanting to be put back on adderall, pt has received all medications without incident, support provided, will continue to monitor

## 2011-07-01 NOTE — Progress Notes (Signed)
Patient ID: [name], female   DOB: 1980/12/10, 31 y.o.   MRN: [id number]  Pt. did not attend aftercare planning group.

## 2011-07-02 MED ORDER — LEVONORGESTREL 20 MCG/24HR IU IUD: INTRAUTERINE_SYSTEM | INTRAUTERINE | Status: DC

## 2011-07-02 MED ORDER — LITHIUM CARBONATE ER 300 MG PO TBCR: 300.0000 mg | EXTENDED_RELEASE_TABLET | Freq: Two times a day (BID) | ORAL | Status: DC

## 2011-07-02 MED ORDER — MIRTAZAPINE 15 MG PO TBDP: 15.0000 mg | ORAL_TABLET | Freq: Every evening | ORAL | Status: DC | PRN

## 2011-07-02 MED ORDER — LIDOCAINE 5 % EX PTCH: 1.0000 | MEDICATED_PATCH | Freq: Every day | CUTANEOUS | Status: AC

## 2011-07-02 MED ORDER — METHOCARBAMOL 500 MG PO TABS: 500.0000 mg | ORAL_TABLET | Freq: Four times a day (QID) | ORAL | Status: AC | PRN
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
## 2011-07-02 NOTE — Progress Notes (Signed)
Currently resting quietly in bed in left lateral position with eyes closed. Respirations are even and unlabored. No acute distress or discomfort noted. Safety has been maintained with Q15 minute observation. Will continue current POC. 

## 2011-07-02 NOTE — Tx Team (Signed)
Interdisciplinary Treatment Plan Update (Adult)  Date:  07/02/2011  Time Reviewed:  10:01 AM   Progress in Treatment: Attending groups: Yes Participating in groups:  Yes Taking medication as prescribed: Yes Tolerating medication:  Yes Family/Significant othe contact made:   Patient understands diagnosis:  Yes Discussing patient identified problems/goals with staff:  Yes Medical problems stabilized or resolved:  Yes Denies suicidal/homicidal ideation: Yes Issues/concerns per patient self-inventory:  None identified Other: N/A  New problem(s) identified: None Identified  Reason for Continuation of Hospitalization: Stable to d/c  Interventions implemented related to continuation of hospitalization: Stable to d/c  Additional comments: N/A  Estimated length of stay: D/C today  Discharge Plan: Pt will follow up at [facility] for medication management and therapy  New goal(s): N/A  Review of initial/current patient goals per problem list:    1.  Goal(s): Address substance use  Met:  Yes  Target date: by discharge  As evidenced by: completed detox protocol and referred to appropriate treatment  2.  Goal (s): Reduce depressive and anxiety symptoms  Met:  Yes  Target date: by discharge  As evidenced by: Reducing depression from a 10 to a 3 as reported by pt.  Pt ranks depression at a 1 and anxiety at a 2 today.   3.  Goal(s): Eliminate SI  Met:  Yes  Target date: by discharge  As evidenced by: Pt denies SI.   Attendees: Patient:  [name] 07/02/2011 10:03 AM   Family:     Physician:  Lupe Carney, DO 07/02/2011 10:01 AM   Nursing: Roswell Miners, RN 07/02/2011 10:01 AM   Case Manager:  Reyes Ivan, LCSWA 07/02/2011  10:01 AM   Counselor:   07/02/2011  10:01 AM   Other:  Robbie Louis, RN 07/02/2011 10:01 AM   Other:     Other:     Other:      Scribe for Treatment Team:   Reyes Ivan 07/02/2011 10:01 AM

## 2011-07-02 NOTE — Progress Notes (Signed)
[facility] Case Management Discharge Plan:  Will you be returning to the same living situation after discharge: Yes,  return home At discharge, do you have transportation home?:Yes,  access to transportation Do you have the ability to pay for your medications:Yes,  access to meds  Release of information consent forms completed and in the chart;  Patient's signature needed at discharge.  Patient to Follow up at:  Follow-up Information    Follow up with [facility] on 07/03/2011. (Appointment scheduled at 11:00 am, arrive 15 minutes early for paperwork!)    Contact information:   [address] [place], [place] [address] [phone]         Patient denies SI/HI:   Yes,  denies SI/HI today    Safety Planning and Suicide Prevention discussed:  Yes,  discussed with pt  Barrier to discharge identified:No.  Summary and Recommendations: Pt attended discharge planning group and actively participated.  Pt presents with calm mood and affect.  Pt ranks depression at a 1 and anxiety at a 2 today.  Pt denies SI.  Pt states that her fiance will be moving in to help her with finances and be of support.  No recommendations from SW.  No further needs voiced by pt.  Pt stable to discharge.     [name] 07/02/2011, 10:16 AM

## 2011-07-02 NOTE — Progress Notes (Signed)
Pt denies SI/HI/AVH. PT discharged to home transported by her fiance. Discharge instructions, f/u appts, and prescriptions explained to pt. Pt states understanding.

## 2011-07-05 NOTE — Progress Notes (Signed)
Patient Discharge Instructions:  After Visit Summary (AVS):   Faxed to:  07/04/2011 Psychiatric Admission Assessment Note:   Faxed to:  07/04/2011 Suicide Risk Assessment - Discharge Assessment:   Faxed to:  07/04/2011 Faxed/Sent to the Next Level Care provider:  07/04/2011  Faxed to [facility] @ [phone]  Wandra Scot, 07/05/2011, 3:08 PM

## 2011-07-09 NOTE — Discharge Summary (Signed)
Physician Discharge Summary Note  Patient:  [name] is an 31 y.o., female MRN:  [id number] DOB:  04-May-1980 Patient phone:  [phone] (home)  Patient address:   [address] [place] [address],   Date of Admission:  06/26/2011 Date of Discharge: 07/02/2011  Level of Care:  OP  Hospital Course:  Fifth or sixth admission for [name] who presented complaining of suicidal thoughts in the setting of cocaine abuse. She had been on a three-week cocaine binge prior to admission and also reported using cannabis regularly, smoking 1-2 joints a week. Please refer to the psychiatric admission note for details regarding the events and circumstances leading to admission.    Still was admitted to our dual diagnosis unit for she complained of mood instability and paranoia which have been persisting for about a year, accompanied by depression, and flashbacks 2 previous abuse. She had not been taking any medications prior to admission had also been diagnosed with bipolar disorder in the past. She recognized and voiced that she needed to be free from drugs and alcohol.   She was started on lithium for mood stability, after discussion of risks and benefits. Remeron was added to help with depression and sleep. She tolerated medications well. Group therapy was productive. She was pleasant and cooperative with peers and staff.Our counselors helped him identify relapse triggers, and develop a relapse prevention program.     She was requesting discharge by the 27th of May, and planned on attending outpatient therapy.   Consults:  None  Discharge Vitals:   Blood pressure 103/74, pulse 107, temperature 97.8 F (36.6 C), temperature source Oral, resp. rate 18, height 5\' 2"  (1.575 m), weight 67.586 kg (149 lb).  Mental Status Exam: See Mental Status Examination and Suicide Risk Assessment completed by Attending Physician prior to discharge.  Discharge destination:  Home  Is patient on multiple antipsychotic  therapies at discharge:  No   Has Patient had three or more failed trials of antipsychotic monotherapy by history:  No  Recommended Plan for Multiple Antipsychotic Therapies: N/A   Medication List  As of 07/09/2011 12:32 PM   STOP taking these medications         ALPRAZolam 0.25 MG tablet      amphetamine-dextroamphetamine 30 MG tablet      lamoTRIgine 100 MG tablet         TAKE these medications      Indication    levonorgestrel 20 MCG/24HR IUD   Commonly known as: MIRENA   Continue this contraceptive as directed by your primary physician.       lidocaine 5 %   Commonly known as: LIDODERM   Place 1 patch onto the skin daily with breakfast. Remove & Discard patch within 12 hours or as directed by MD, for chronic back pain.       lithium carbonate 300 MG CR tablet   Commonly known as: LITHOBID   Take 1 tablet (300 mg total) by mouth every 12 (twelve) hours. For mood stability.       methocarbamol 500 MG tablet   Commonly known as: ROBAXIN   Take 1 tablet (500 mg total) by mouth every 6 (six) hours as needed. For back spasms.       mirtazapine 15 MG disintegrating tablet   Commonly known as: REMERON SOL-TAB   Take 1 tablet (15 mg total) by mouth at bedtime and may repeat dose one time if needed. For depression and helps with sleep.  Follow-up Information    Follow up with [facility] on 07/03/2011. (Appointment scheduled at 11:00 am, arrive 15 minutes early for paperwork!)    Contact information:   [address] [place], [place] [address] [phone]         Follow-up recommendations:  Activity:  unrestricted Diet:  regular  Signed: Elina Streng A 07/09/2011, 12:32 PM

## 2013-02-12 ENCOUNTER — Encounter (HOSPITAL_COMMUNITY): Payer: Self-pay | Admitting: *Deleted

## 2013-02-12 ENCOUNTER — Emergency Department (EMERGENCY_DEPARTMENT_HOSPITAL)
Admission: EM | Admit: 2013-02-12 | Discharge: 2013-02-13 | Disposition: A | Discharge status: Other Acute Inpatient Hospital | Payer: MEDICAID | Source: Home / Self Care | Attending: Emergency Medicine | Admitting: Emergency Medicine

## 2013-02-12 ENCOUNTER — Encounter (HOSPITAL_COMMUNITY): Payer: Self-pay | Admitting: Emergency Medicine

## 2013-02-12 ENCOUNTER — Ambulatory Visit (HOSPITAL_COMMUNITY)
Admission: RE | Admit: 2013-02-12 | Discharge: 2013-02-12 | Disposition: A | Discharge status: Home / Self Care | Payer: 59 | Attending: Psychiatry | Admitting: Psychiatry

## 2013-02-12 DIAGNOSIS — R45851 Suicidal ideations: Secondary | ICD-10-CM

## 2013-02-12 DIAGNOSIS — F3130 Bipolar disorder, current episode depressed, mild or moderate severity, unspecified: Secondary | ICD-10-CM | POA: Insufficient documentation

## 2013-02-12 DIAGNOSIS — Z3202 Encounter for pregnancy test, result negative: Secondary | ICD-10-CM

## 2013-02-12 DIAGNOSIS — F17200 Nicotine dependence, unspecified, uncomplicated: Secondary | ICD-10-CM

## 2013-02-12 DIAGNOSIS — F4310 Post-traumatic stress disorder, unspecified: Secondary | ICD-10-CM

## 2013-02-12 DIAGNOSIS — M129 Arthropathy, unspecified: Secondary | ICD-10-CM

## 2013-02-12 DIAGNOSIS — F1410 Cocaine abuse, uncomplicated: Secondary | ICD-10-CM

## 2013-02-12 DIAGNOSIS — Z79899 Other long term (current) drug therapy: Secondary | ICD-10-CM

## 2013-02-12 DIAGNOSIS — F909 Attention-deficit hyperactivity disorder, unspecified type: Secondary | ICD-10-CM

## 2013-02-12 LAB — RAPID URINE DRUG SCREEN, HOSP PERFORMED
Amphetamines: NOT DETECTED
BENZODIAZEPINES: NOT DETECTED
Barbiturates: NOT DETECTED
Cocaine: POSITIVE — AB
OPIATES: NOT DETECTED
Tetrahydrocannabinol: POSITIVE — AB

## 2013-02-12 LAB — COMPREHENSIVE METABOLIC PANEL
ALT: 10 U/L (ref 0–35)
AST: 15 U/L (ref 0–37)
Albumin: 4.6 g/dL (ref 3.5–5.2)
Alkaline Phosphatase: 55 U/L (ref 39–117)
BUN: 19 mg/dL (ref 6–23)
CALCIUM: 10.1 mg/dL (ref 8.4–10.5)
CO2: 25 meq/L (ref 19–32)
CREATININE: 0.93 mg/dL (ref 0.50–1.10)
Chloride: 100 mEq/L (ref 96–112)
GFR calc Af Amer: >90 mL/min (ref >90)
GFR calc non Af Amer: 82 mL/min — ABNORMAL LOW (ref >90)
Glucose, Bld: 92 mg/dL (ref 70–99)
Potassium: 4.1 mEq/L (ref 3.7–5.3)
Sodium: 138 mEq/L (ref 137–147)
Total Bilirubin: 0.6 mg/dL (ref 0.3–1.2)
Total Protein: 7.9 g/dL (ref 6.0–8.3)

## 2013-02-12 LAB — CBC WITH DIFFERENTIAL/PLATELET
BASOS ABS: 0 10*3/uL (ref 0.0–0.1)
Basophils Relative: 1 % (ref 0–1)
EOS PCT: 2 % (ref 0–5)
Eosinophils Absolute: 0.1 10*3/uL (ref 0.0–0.7)
HEMATOCRIT: 44 % (ref 36.0–46.0)
Hemoglobin: 14.9 g/dL (ref 12.0–15.0)
Lymphocytes Relative: 27 % (ref 12–46)
Lymphs Abs: 1.9 10*3/uL (ref 0.7–4.0)
MCH: 27.9 pg (ref 26.0–34.0)
MCHC: 33.9 g/dL (ref 30.0–36.0)
MCV: 82.2 fL (ref 78.0–100.0)
MONO ABS: 0.8 10*3/uL (ref 0.1–1.0)
Monocytes Relative: 12 % (ref 3–12)
Neutro Abs: 4.1 10*3/uL (ref 1.7–7.7)
Neutrophils Relative %: 59 % (ref 43–77)
Platelets: 319 10*3/uL (ref 150–400)
RBC: 5.35 MIL/uL — ABNORMAL HIGH (ref 3.87–5.11)
RDW: 12.9 % (ref 11.5–15.5)
WBC: 7 10*3/uL (ref 4.0–10.5)

## 2013-02-12 LAB — ETHANOL: Alcohol, Ethyl (B): <11 mg/dL (ref 0–11)

## 2013-02-12 LAB — POCT PREGNANCY, URINE: Preg Test, Ur: NEGATIVE

## 2013-02-12 MED ORDER — DICYCLOMINE HCL 20 MG PO TABS: 20.0000 mg | ORAL_TABLET | Freq: Four times a day (QID) | ORAL | Status: DC | PRN

## 2013-02-12 MED ORDER — METHOCARBAMOL 500 MG PO TABS
500.0000 mg | ORAL_TABLET | Freq: Three times a day (TID) | ORAL | Status: DC | PRN
  Administered 2013-02-13 (×2): 500 mg via ORAL
  Filled 2013-02-12 (×2): qty 1

## 2013-02-12 MED ORDER — ZOLPIDEM TARTRATE 5 MG PO TABS: 5.0000 mg | ORAL_TABLET | Freq: Every evening | ORAL | Status: DC | PRN

## 2013-02-12 MED ORDER — NAPROXEN 500 MG PO TABS: 500.0000 mg | ORAL_TABLET | Freq: Two times a day (BID) | ORAL | Status: DC | PRN

## 2013-02-12 MED ORDER — ONDANSETRON 4 MG PO TBDP: 4.0000 mg | ORAL_TABLET | Freq: Four times a day (QID) | ORAL | Status: DC | PRN

## 2013-02-12 MED ORDER — ALUM & MAG HYDROXIDE-SIMETH 200-200-20 MG/5ML PO SUSP: 30.0000 mL | ORAL | Status: DC | PRN

## 2013-02-12 MED ORDER — LORAZEPAM 1 MG PO TABS
1.0000 mg | ORAL_TABLET | Freq: Three times a day (TID) | ORAL | Status: DC | PRN
  Administered 2013-02-12: 1 mg via ORAL
  Filled 2013-02-12: qty 1

## 2013-02-12 MED ORDER — LOPERAMIDE HCL 2 MG PO CAPS: 2.0000 mg | ORAL_CAPSULE | ORAL | Status: DC | PRN

## 2013-02-12 MED ORDER — NICOTINE 21 MG/24HR TD PT24
21.0000 mg | MEDICATED_PATCH | Freq: Every day | TRANSDERMAL | Status: DC
  Administered 2013-02-13: 21 mg via TRANSDERMAL
  Filled 2013-02-12: qty 1

## 2013-02-12 MED ORDER — HYDROXYZINE HCL 25 MG PO TABS: 25.0000 mg | ORAL_TABLET | Freq: Four times a day (QID) | ORAL | Status: DC | PRN

## 2013-02-12 MED ORDER — IBUPROFEN 200 MG PO TABS
600.0000 mg | ORAL_TABLET | Freq: Three times a day (TID) | ORAL | Status: DC | PRN
  Administered 2013-02-12: 600 mg via ORAL
  Filled 2013-02-12: qty 3

## 2013-02-12 MED ORDER — ONDANSETRON HCL 4 MG PO TABS: 4.0000 mg | ORAL_TABLET | Freq: Three times a day (TID) | ORAL | Status: DC | PRN

## 2013-02-12 MED ORDER — ACETAMINOPHEN 325 MG PO TABS
650.0000 mg | ORAL_TABLET | ORAL | Status: DC | PRN
  Administered 2013-02-12: 650 mg via ORAL
  Filled 2013-02-12: qty 2
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
## 2013-02-13 ENCOUNTER — Inpatient Hospital Stay (HOSPITAL_COMMUNITY)
Admission: AD | Admit: 2013-02-13 | Discharge: 2013-02-21 | DRG: 897 | Disposition: A | Discharge status: Home / Self Care | Payer: MEDICAID | Source: Intra-hospital | Attending: Emergency Medicine | Admitting: Emergency Medicine

## 2013-02-13 ENCOUNTER — Encounter (HOSPITAL_COMMUNITY): Payer: Self-pay | Admitting: *Deleted

## 2013-02-13 DIAGNOSIS — F39 Unspecified mood [affective] disorder: Secondary | ICD-10-CM

## 2013-02-13 DIAGNOSIS — F1120 Opioid dependence, uncomplicated: Secondary | ICD-10-CM

## 2013-02-13 DIAGNOSIS — S0003XA Contusion of scalp, initial encounter: Secondary | ICD-10-CM

## 2013-02-13 DIAGNOSIS — F1920 Other psychoactive substance dependence, uncomplicated: Principal | ICD-10-CM | POA: Diagnosis present

## 2013-02-13 DIAGNOSIS — F4310 Post-traumatic stress disorder, unspecified: Secondary | ICD-10-CM

## 2013-02-13 DIAGNOSIS — F1420 Cocaine dependence, uncomplicated: Secondary | ICD-10-CM

## 2013-02-13 DIAGNOSIS — F1110 Opioid abuse, uncomplicated: Secondary | ICD-10-CM

## 2013-02-13 DIAGNOSIS — F1410 Cocaine abuse, uncomplicated: Secondary | ICD-10-CM

## 2013-02-13 DIAGNOSIS — Z79899 Other long term (current) drug therapy: Secondary | ICD-10-CM

## 2013-02-13 DIAGNOSIS — F3130 Bipolar disorder, current episode depressed, mild or moderate severity, unspecified: Secondary | ICD-10-CM

## 2013-02-13 DIAGNOSIS — R55 Syncope and collapse: Secondary | ICD-10-CM

## 2013-02-13 DIAGNOSIS — F909 Attention-deficit hyperactivity disorder, unspecified type: Secondary | ICD-10-CM | POA: Diagnosis present

## 2013-02-13 DIAGNOSIS — S161XXA Strain of muscle, fascia and tendon at neck level, initial encounter: Secondary | ICD-10-CM

## 2013-02-13 DIAGNOSIS — R45851 Suicidal ideations: Secondary | ICD-10-CM

## 2013-02-13 DIAGNOSIS — F319 Bipolar disorder, unspecified: Secondary | ICD-10-CM | POA: Diagnosis present

## 2013-02-13 DIAGNOSIS — F1910 Other psychoactive substance abuse, uncomplicated: Secondary | ICD-10-CM

## 2013-02-13 DIAGNOSIS — IMO0002 Reserved for concepts with insufficient information to code with codable children: Secondary | ICD-10-CM

## 2013-02-13 MED ORDER — DICYCLOMINE HCL 20 MG PO TABS
20.0000 mg | ORAL_TABLET | Freq: Four times a day (QID) | ORAL | Status: AC | PRN
  Administered 2013-02-13: 20 mg via ORAL
  Filled 2013-02-13: qty 1

## 2013-02-13 MED ORDER — METHOCARBAMOL 500 MG PO TABS
500.0000 mg | ORAL_TABLET | Freq: Three times a day (TID) | ORAL | Status: DC | PRN
  Administered 2013-02-13 – 2013-02-17 (×10): 500 mg via ORAL
  Filled 2013-02-13 (×9): qty 1

## 2013-02-13 MED ORDER — LOPERAMIDE HCL 2 MG PO CAPS: 2.0000 mg | ORAL_CAPSULE | ORAL | Status: AC | PRN

## 2013-02-13 MED ORDER — IBUPROFEN 600 MG PO TABS
600.0000 mg | ORAL_TABLET | Freq: Three times a day (TID) | ORAL | Status: DC | PRN
  Administered 2013-02-13 – 2013-02-18 (×8): 600 mg via ORAL
  Filled 2013-02-13 (×9): qty 1

## 2013-02-13 MED ORDER — NAPROXEN 250 MG PO TABS
500.0000 mg | ORAL_TABLET | Freq: Two times a day (BID) | ORAL | Status: AC | PRN
  Administered 2013-02-14 – 2013-02-17 (×8): 500 mg via ORAL
  Filled 2013-02-13 (×6): qty 2
  Filled 2013-02-13 (×2): qty 1
  Filled 2013-02-13 (×4): qty 2

## 2013-02-13 MED ORDER — ONDANSETRON 4 MG PO TBDP: 4.0000 mg | ORAL_TABLET | Freq: Four times a day (QID) | ORAL | Status: AC | PRN

## 2013-02-13 MED ORDER — NICOTINE 21 MG/24HR TD PT24
21.0000 mg | MEDICATED_PATCH | Freq: Every day | TRANSDERMAL | Status: DC
  Administered 2013-02-15 – 2013-02-18 (×4): 21 mg via TRANSDERMAL
  Filled 2013-02-13 (×9): qty 1

## 2013-02-13 MED ORDER — INFLUENZA VAC SPLIT QUAD 0.5 ML IM SUSP
0.5000 mL | INTRAMUSCULAR | Status: AC
  Administered 2013-02-14: 0.5 mL via INTRAMUSCULAR
  Filled 2013-02-13: qty 0.5

## 2013-02-13 MED ORDER — ALUM & MAG HYDROXIDE-SIMETH 200-200-20 MG/5ML PO SUSP
30.0000 mL | ORAL | Status: DC | PRN
  Administered 2013-02-17: 30 mL via ORAL

## 2013-02-13 MED ORDER — ONDANSETRON HCL 4 MG PO TABS: 4.0000 mg | ORAL_TABLET | Freq: Three times a day (TID) | ORAL | Status: DC | PRN

## 2013-02-13 MED ORDER — HYDROXYZINE HCL 25 MG PO TABS
25.0000 mg | ORAL_TABLET | Freq: Four times a day (QID) | ORAL | Status: AC | PRN
  Administered 2013-02-13 – 2013-02-18 (×13): 25 mg via ORAL
  Filled 2013-02-13 (×13): qty 1

## 2013-02-13 MED ORDER — ZOLPIDEM TARTRATE 5 MG PO TABS
5.0000 mg | ORAL_TABLET | Freq: Every evening | ORAL | Status: DC | PRN
  Administered 2013-02-13 – 2013-02-15 (×3): 5 mg via ORAL
  Filled 2013-02-13 (×3): qty 1

## 2013-02-13 MED ORDER — MAGNESIUM HYDROXIDE 400 MG/5ML PO SUSP
30.0000 mL | Freq: Every day | ORAL | Status: DC | PRN
Start: 2013-02-13 — End: 2013-02-21

## 2013-02-13 MED ORDER — ACETAMINOPHEN 325 MG PO TABS
650.0000 mg | ORAL_TABLET | Freq: Four times a day (QID) | ORAL | Status: DC | PRN
  Administered 2013-02-14 – 2013-02-19 (×4): 650 mg via ORAL
  Filled 2013-02-13 (×3): qty 2

## 2013-02-13 NOTE — BH Assessment (Signed)
[place] Assessment Progress Note  The following facilities were contacted in an attempt to place the pt:  [facility] - left message @ [place] - no beds per Roderic Palau @ 0911 - referral sent for possible wait list [facility] - no beds, but expecting discharges today @ [phone] - referral faxed for review [facility] - no beds per Darlene @ [place] per [place] @ [phone] - referral faxed for review [facility] - beds per [facility] @ 0930 - referral faxed for review
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
## 2013-02-13 NOTE — BHH Group Notes (Signed)
Adult Psychoeducational Group Note  Date:  02/13/2013 Time:  10:26 PM  Group Topic/Focus:  AA Meeting  Participation Level:  Active  Participation Quality:  Appropriate  Affect:  Flat  Cognitive:  Alert  Insight: Appropriate  Engagement in Group:  Engaged  Modes of Intervention:  Discussion and Education  Additional Comments:  [name] attended [name].  [name] A 02/13/2013, 10:26 PM

## 2013-02-13 NOTE — BH Assessment (Signed)
[place] Assessment Progress Note  Update:  Received call from [facility] @ 6203 stating pt accepted to Dr. Launa Grill and requested pt placed under IVC for transport.  Updated Dorathy Kinsman, SW, at [place], as well as Letitia Libra, [facility], at [facility].  Number for nurse to nurse report is [phone].  [facility] ready for pt to be transported there per Gerald Stabs.  Received call from [facility] from [facility] @ 1134 stating pt declined by Dr. Veronda Prude due to no appropriate bed for detox there.

## 2013-02-13 NOTE — Progress Notes (Signed)
   CARE MANAGEMENT ED NOTE 02/13/2013  Patient:  [name], [name]   Account Number:  [id number]  Date Initiated:  02/13/2013  Documentation initiated by:  Jackelyn Poling  Subjective/Objective Assessment:   33 yr old female medicaid of [place] pt c/o detox from cocaine and vicodin, SI no plan.  Feeling hopeless Confirms medicaid in [place] "soon we will be getting [employer] coverage" Confirms no pcp in [place]     Subjective/Objective Assessment Detail:   Pt stated she has asked "a few people about my Robaxin for my arm but no one has told me anything"     Action/Plan:   CM spoke with pt after review of EPIC notes, Cm informed [place] RN about pt's statements about her Robaxin for her arm   Action/Plan Detail:   Pt given a list of medicaid accepting [place] providers Unable to obtain a list of [employer] providers Pt updated   Anticipated DC Date:       Status Recommendation to Physician:   Result of Recommendation:    Other ED [place]  Other  PCP issues  Outpatient Services - Pt will follow up    Choice offered to / List presented to:            Status of service:  Completed, signed off  ED Comments:   ED Comments Detail:

## 2013-02-13 NOTE — Progress Notes (Signed)
Pt wishes to remain voluntary and bed available at [facility] per [facility]. CSW spoke with NP, who agreed. CSW spoke with [facility] Patient accepted to [facility]2 accepted by [facility] to Dr. Sabra Heck. Pt to be transported to [facility] by Lucia Estelle, LCSW [phone]  ED CSW .02/13/2013

## 2013-02-14 ENCOUNTER — Encounter (HOSPITAL_COMMUNITY): Payer: Self-pay | Admitting: Psychiatry

## 2013-02-14 DIAGNOSIS — F39 Unspecified mood [affective] disorder: Secondary | ICD-10-CM

## 2013-02-14 DIAGNOSIS — F1410 Cocaine abuse, uncomplicated: Secondary | ICD-10-CM

## 2013-02-14 DIAGNOSIS — F1120 Opioid dependence, uncomplicated: Secondary | ICD-10-CM

## 2013-02-14 LAB — TSH: TSH: 1.435 u[IU]/mL (ref 0.350–4.500)

## 2013-02-14 MED ORDER — GABAPENTIN 300 MG PO CAPS
300.0000 mg | ORAL_CAPSULE | Freq: Three times a day (TID) | ORAL | Status: DC
  Administered 2013-02-14 – 2013-02-16 (×7): 300 mg via ORAL
  Filled 2013-02-14 (×12): qty 1

## 2013-02-14 MED ORDER — CLONIDINE HCL 0.1 MG PO TABS
0.1000 mg | ORAL_TABLET | Freq: Four times a day (QID) | ORAL | Status: AC
Start: 2013-02-14 — End: 2013-02-16
  Administered 2013-02-14 – 2013-02-15 (×6): 0.1 mg via ORAL
  Filled 2013-02-14 (×9): qty 1

## 2013-02-14 MED ORDER — DULOXETINE HCL 20 MG PO CPEP
20.0000 mg | ORAL_CAPSULE | Freq: Every day | ORAL | Status: DC
  Administered 2013-02-14 – 2013-02-16 (×3): 20 mg via ORAL
  Filled 2013-02-14 (×7): qty 1

## 2013-02-14 MED ORDER — CLONIDINE HCL 0.1 MG PO TABS
0.1000 mg | ORAL_TABLET | ORAL | Status: AC
  Administered 2013-02-16 – 2013-02-17 (×3): 0.1 mg via ORAL
  Filled 2013-02-14 (×5): qty 1

## 2013-02-14 MED ORDER — CLONIDINE HCL 0.1 MG PO TABS
0.1000 mg | ORAL_TABLET | Freq: Every day | ORAL | Status: AC
Start: 2013-02-18 — End: 2013-02-20
  Filled 2013-02-14 (×3): qty 1

## 2013-02-14 MED ORDER — LAMOTRIGINE 25 MG PO TABS
25.0000 mg | ORAL_TABLET | Freq: Two times a day (BID) | ORAL | Status: DC
  Administered 2013-02-14 – 2013-02-20 (×12): 25 mg via ORAL
  Filled 2013-02-14 (×18): qty 1

## 2013-02-14 NOTE — Progress Notes (Signed)
D-Patient is fatigued today but attending groups with good participation.  She rates depression and hopelessness at 10.  "Off and on" SI and contracts for safety.  Reports fair sleep and low energy.  PRN medications requested for anxiety and pain.  C/O cravings, chills and tremors r/t withdrawals.  A- Support and encouragement offered.  F/U with prn medications.  Continue current POC and evaluation of treatment goals.  Continue 15' checks for safety.  R- Safety maintained.

## 2013-02-14 NOTE — Progress Notes (Signed)
D.  Pt pleasant on approach, still feeling some anxiety and withdrawal symptoms.  Positive for evening wrap up group, interacting appropriately within milieu.  Denies SI/HI/hallucinations at this time.  A.  Support and encouragement offered, medications given as ordered for withdrawal.  R.  Pt remains safe on unit, will continue to monitor.

## 2013-02-14 NOTE — BHH Group Notes (Signed)
[place] Group Notes:  (Nursing/MHT/Case Management/Adjunct)  Date:  02/14/2013  Time:  10:21 AM  Type of Therapy:  Nurse Education  Participation Level:  Active  Participation Quality:  Appropriate  Affect:  Anxious  Cognitive:  Alert  Insight:  Appropriate  Engagement in Group:  Developing/Improving  Modes of Intervention:  Discussion and Support  Summary of Progress/Problems: Pt is anxious attending groups and interacting appropriately with staff and peers.   Mosie Lukes 02/14/2013, 10:21 AM
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
## 2013-02-14 NOTE — Progress Notes (Signed)
Patient ID: [name], female   DOB: 06-Dec-1980, 33 y.o.   MRN: [id number]  D. Resting in bed with eyes closed. No distress noted.  A. Q 15 minute checks maintained for safety. R. The patient is safe. Will continue to monitor.
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
## 2013-02-14 NOTE — [facility] Suicide Risk Assessment (Signed)
Suicide Risk Assessment  Admission Assessment     Nursing information obtained from:    Demographic factors:    Current Mental Status:    Loss Factors:    Historical Factors:    Risk Reduction Factors:     CLINICAL FACTORS:   Severe Anxiety and/or Agitation Bipolar Disorder:   Depressive phase Depression:   Aggression Anhedonia Comorbid alcohol abuse/dependence Hopelessness Impulsivity Insomnia Severe Alcohol/Substance Abuse/Dependencies Unstable or Poor Therapeutic Relationship  COGNITIVE FEATURES THAT CONTRIBUTE TO RISK:  Closed-mindedness Polarized thinking    SUICIDE RISK:   Mild:  Suicidal ideation of limited frequency, intensity, duration, and specificity.  There are no identifiable plans, no associated intent, mild dysphoria and related symptoms, good self-control (both objective and subjective assessment), few other risk factors, and identifiable protective factors, including available and accessible social support.  PLAN OF CARE:1. Admit for crisis management and stabilization. 2. Medication management to reduce current symptoms to base line and improve the     patient's overall level of functioning 3. Treat health problems as indicated. 4. Develop treatment plan to decrease risk of relapse upon discharge and the need for     readmission. 5. Psycho-social education regarding relapse prevention and self care. 6. Health care follow up as needed for medical problems. 7. Restart home medications where appropriate.   I certify that inpatient services furnished can reasonably be expected to improve the patient's condition.  Corena Pilgrim, MD 02/14/2013, 10:44 AM
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
## 2013-02-15 DIAGNOSIS — F1910 Other psychoactive substance abuse, uncomplicated: Secondary | ICD-10-CM

## 2013-02-15 NOTE — Progress Notes (Signed)
D: Pt denies SI/HI/AV. Pt is pleasant and cooperative. Pt rates depression at a 9 and Helplessness/hopelessness at a 9.  A: Pt was offered support and encouragement. Pt was given scheduled medications. Pt was encourage to attend groups. Q 15 minute checks were done for safety.  R:Pt attends groups and interacts well with peers and staff. Pt taking medication. Pt has no complaints.Pt receptive to treatment and safety maintained on unit.

## 2013-02-15 NOTE — Progress Notes (Signed)
Patient did attend the evening speaker AA meeting.  

## 2013-02-15 NOTE — Progress Notes (Signed)
[facility] Group Notes:  (Nursing/MHT/Case Management/Adjunct)  Date:  02/15/2013  Time:  2:43 PM  Type of Therapy:  Psychoeducational Skills  Participation Level:  Did Not Attend  Summary of Progress/Problems: Pt was in bed asleep during group.  Clint Bolder 02/15/2013, 2:43 PM
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
## 2013-02-15 NOTE — Progress Notes (Signed)
D.  Pt pleasant on approach, still reporting some anxiety but no other withdrawal to speak of.  Denies complaints at this time.  Positive for evening AA group, interacting appropriately within milieu.  Denies SI/HI/hallucinations at this time.  A.  Support and encouragement offered  R.  Pt remains safe, will continue to monitor.

## 2013-02-15 NOTE — BHH Group Notes (Signed)
[facility] Group Notes: (Clinical Social Work)   02/15/2013      Type of Therapy:  Group Therapy   Participation Level:  Did Not Attend - was there at the beginning of group, but quickly left   Selmer Dominion, LCSW 02/15/2013, 12:35 PM

## 2013-02-15 NOTE — Progress Notes (Signed)
[facility] Group Notes:  (Nursing/MHT/Case Management/Adjunct)  Date:  02/14/2013  Time:  2100  Type of Therapy:  wrap up group  Participation Level:  Active  Participation Quality:  Appropriate, Attentive, Sharing and Supportive  Affect:  Flat  Cognitive:  Appropriate  Insight:  Appropriate  Engagement in Group:  Engaged  Modes of Intervention:  Clarification, Education and Support  Summary of Progress/Problems:   [name] 02/15/2013, 1:55 AM

## 2013-02-16 MED ORDER — GABAPENTIN 400 MG PO CAPS
400.0000 mg | ORAL_CAPSULE | Freq: Three times a day (TID) | ORAL | Status: DC
  Administered 2013-02-16 – 2013-02-17 (×3): 400 mg via ORAL
  Filled 2013-02-16 (×5): qty 1

## 2013-02-16 MED ORDER — ZOLPIDEM TARTRATE 10 MG PO TABS
10.0000 mg | ORAL_TABLET | Freq: Every evening | ORAL | Status: DC | PRN
  Administered 2013-02-16 – 2013-02-20 (×5): 10 mg via ORAL
  Filled 2013-02-16 (×5): qty 1

## 2013-02-16 MED ORDER — DULOXETINE HCL 30 MG PO CPEP
30.0000 mg | ORAL_CAPSULE | Freq: Every day | ORAL | Status: DC
  Administered 2013-02-17 – 2013-02-20 (×4): 30 mg via ORAL
  Filled 2013-02-16 (×7): qty 1

## 2013-02-16 MED ORDER — LIDOCAINE 5 % EX PTCH
1.0000 | MEDICATED_PATCH | Freq: Every day | CUTANEOUS | Status: DC
  Administered 2013-02-16 – 2013-02-21 (×6): 1 via TRANSDERMAL
  Filled 2013-02-16 (×10): qty 1
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
## 2013-02-16 NOTE — Progress Notes (Signed)
Adult Psychoeducational Group Note  Date:  02/16/2013 Time:  2000  Group Topic/Focus:  Wrap-Up Group:   The focus of this group is to help patients review their daily goal of treatment and discuss progress on daily workbooks.  Participation Level:  Minimal  Participation Quality:  Resistant  Affect:  Irritable  Cognitive:  Alert and Oriented  Insight: Limited  Engagement in Group:  Limited  Modes of Intervention:  Discussion and Support  Additional Comments:  Pt stated that today she felt like she was on an "emotional roller coaster". Pt appeared flat; her mood--agitated.   Jacob Moores Monique 02/16/2013, 9:57 PM
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
## 2013-02-16 NOTE — Progress Notes (Signed)
NUTRITION ASSESSMENT  Pt identified as at risk on the Malnutrition Screen Tool  INTERVENTION: 1. Educated patient on the importance of nutrition and encouraged intake of food and beverages. 2. Discussed weight goals. 3. Supplements: none at this time  NUTRITION DIAGNOSIS: Unintentional weight loss related to sub-optimal intake as evidenced by pt report.   Goal: Pt to meet >/= 90% of their estimated nutrition needs.  Monitor:  PO intake  Assessment:  Patient admitted for cocaine and opoid detox.  Hx includes bipolar depression.  Patient reports no appetite currently and prior to admit and always forces self to eat.  Patient related loss of appetite to stress, anxiety, increased pain, and medications.  States that weight fluctuates some.  Does admit to emotional eating at times.  Weighted up to 207 lbs in 2007.  149 lbs when I saw her 06/28/2011.    33 y.o. female  Height: Ht Readings from Last 1 Encounters:  02/13/13 5\' 5"  (1.651 m)    Weight: Wt Readings from Last 1 Encounters:  02/13/13 140 lb (63.504 kg)    Weight Hx: Wt Readings from Last 10 Encounters:  02/13/13 140 lb (63.504 kg)  02/12/13 160 lb (72.576 kg)  06/26/11 149 lb (67.586 kg)    BMI:  Body mass index is 23.3 kg/(m^2). Pt meets criteria for normal weight based on current BMI.  Estimated Nutritional Needs: Kcal: 25-30 kcal/kg Protein: > 1 gram protein/kg Fluid: 1 ml/kcal  Diet Order: General Pt is also offered choice of unit snacks mid-morning and mid-afternoon.  Pt is eating as desired.   Lab results and medications reviewed.   Antonieta Iba, RD, LDN Clinical Inpatient Dietitian Pager:  [phone] Weekend and after hours pager:  [phone]

## 2013-02-16 NOTE — ED Provider Notes (Signed)
Medical screening examination/treatment/procedure(s) were performed by non-physician practitioner and as supervising physician I was immediately available for consultation/collaboration.  EKG Interpretation   None         Blanchie Dessert, MD 02/16/13 [id number]

## 2013-02-16 NOTE — Progress Notes (Signed)
Recreation Therapy Notes  Date: 01.12.2015 Time: 3:00pm Location: [address] Dayroom  Group Topic: Wellness  Goal Area(s) Addresses:  Patient will identify healthy investments in dimensions of wellness.  Patient will identify importance of balance in all dimensions of wellness.   Behavioral Response: Appropriate, Engaged   Intervention: Group Activity  Activity: Wellness Dimensions. As a group patients were asked to identify healthy ways to invest in 6 dimensions of wellness: Physical, Social, Intellectual, Emotional, Environmental, Spiritual.   Education: Wellness, Discharge Planning, Coping Skills   Education Outcome: Acknowledges Education  Clinical Observations/Feedback: Patient shared she struggles with grief and her inability to process grief fuels her use. Patient actively engaged in group activity. Patient offered suggestions to group list, as well as contributed to group discussion, identifying that all activities listed on group list could be used as coping skills. Patient additionally stated if she were able to invest in her wellness in healthy ways "my life would be a little brighter."   [employer], LRT/CTRS  Lane Hacker 02/16/2013 4:13 PM
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
## 2013-02-16 NOTE — Progress Notes (Signed)
Patient ID: [name], female   DOB: Dec 15, 1980, 33 y.o.   MRN: [id number] She has been up and to groups interacting with peers and staff. Has requested and received 5 prn's today for neck and shoulder pain and withdrawal symptoms. She did not fill out her self inventory.  Order obtained for a lidoderm  Patch and was applied.
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
## 2013-02-17 MED ORDER — RAMELTEON 8 MG PO TABS
8.0000 mg | ORAL_TABLET | Freq: Every day | ORAL | Status: DC
  Administered 2013-02-17 – 2013-02-20 (×4): 8 mg via ORAL
  Filled 2013-02-17 (×5): qty 1
  Filled 2013-02-17: qty 3
  Filled 2013-02-17: qty 1

## 2013-02-17 MED ORDER — GABAPENTIN 300 MG PO CAPS
600.0000 mg | ORAL_CAPSULE | Freq: Three times a day (TID) | ORAL | Status: DC
  Administered 2013-02-17 – 2013-02-21 (×11): 600 mg via ORAL
  Filled 2013-02-17 (×6): qty 2
  Filled 2013-02-17: qty 18
  Filled 2013-02-17 (×10): qty 2
  Filled 2013-02-17: qty 18
  Filled 2013-02-17: qty 2
  Filled 2013-02-17: qty 18

## 2013-02-17 NOTE — Progress Notes (Signed)
Patient ID: [name], female   DOB: 1980/10/18, 33 y.o.   MRN: [id number] She has been up and to most of the groups interacting with peers and staff. Self inventory: depression 7, hopelessness 10, withdrawal diarrhea,craving, agitation. Denies SI thoughts. Has requested and received prn medication for left shoulder and back pain. Received vistaril for c/o of  Anxiety.
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
## 2013-02-17 NOTE — Progress Notes (Signed)
Adult Psychoeducational Group Note  Date:  02/17/2013 Time:  2000  Group Topic/Focus:  AA  Participation Level:  Minimal  Participation Quality:  Appropriate and Attentive  Affect:  Appropriate  Cognitive:  Alert, Appropriate and Oriented  Insight: Appropriate  Engagement in Group:  Engaged  Modes of Intervention:  Discussion and Support  Additional Comments:    Jacob Moores Monique 02/17/2013, 10:27 PM

## 2013-02-17 NOTE — BHH Group Notes (Signed)
[place] LCSW Group Therapy  02/17/2013  1:15 PM   Type of Therapy:  Group Therapy  Participation Level:  Active  Participation Quality:  Attentive, Sharing and Supportive  Affect:  Flat and Depressed  Cognitive:  Alert and Oriented  Insight:  Developing/Improving and Engaged  Engagement in Therapy:  Developing/Improving and Engaged  Modes of Intervention:  Activity, Clarification, Confrontation, Discussion, Education, Exploration, Limit-setting, Orientation, Problem-solving, Rapport Building, Art therapist, Socialization and Support  Summary of Progress/Problems: Patient was attentive and engaged with speaker from [facility].  Patient was attentive to speaker while they shared their story of dealing with mental health and overcoming it.  Patient expressed interest in their programs and services and received information on their agency.  Patient processed ways they can relate to the speaker.     Regan Lemming, LCSW 02/17/2013 1:22 PM

## 2013-02-17 NOTE — BHH Suicide Risk Assessment (Signed)
[facility] INPATIENT:  Family/Significant Other Suicide Prevention Education  Suicide Prevention Education:  Patient Refusal for Family/Significant Other Suicide Prevention Education: The patient [place] has refused to provide written consent for family/significant other to be provided Family/Significant Other Suicide Prevention Education during admission and/or prior to discharge.  Physician notified. "I don't really have any family or supports."   SPE completed with pt. SPI pamphlet provided to pt and she was encouraged to share information with support network, ask questions, and talk about any concerns.   Smart, Aubriee Szeto LCSWA  02/17/2013, 9:22 AM

## 2013-02-17 NOTE — Progress Notes (Signed)
Recreation Therapy Notes  nimal-Assisted Activity/Therapy (AAA/T) Program Checklist/Progress Notes Patient Eligibility Criteria Checklist & Daily Group note for Rec Tx Intervention  Date: 01.13.2015 Time: 2:45pm Location: [address]   AAA/T Program Assumption of Risk Form signed by Patient/ or Parent Legal Guardian yes  Patient is free of allergies or sever asthma yes  Patient reports no fear of animals yes  Patient reports no history of cruelty to animals yes   Patient understands his/her participation is voluntary yes  Patient washes hands before animal contact yes  Patient washes hands after animal contact yes  Behavioral Response: Appropriate   Education: Hand Washing, Appropriate Animal Interaction   Education Outcome: Acknowledges understanding   Clinical Observations/Feedback: Patient interacted appropriately with therapy dog team, peers and LRT.   Laureen Ochs Tarnisha Kachmar, LRT/CTRS  Lane Hacker 02/17/2013 5:21 PM

## 2013-02-17 NOTE — Progress Notes (Signed)
Seen and agreed. Nyana Haren, MD 

## 2013-02-18 ENCOUNTER — Encounter (HOSPITAL_COMMUNITY): Payer: Self-pay | Admitting: Emergency Medicine

## 2013-02-18 ENCOUNTER — Inpatient Hospital Stay (HOSPITAL_COMMUNITY): Payer: MEDICAID

## 2013-02-18 LAB — POCT I-STAT, CHEM 8
BUN: 11 mg/dL (ref 6–23)
BUN: 21 mg/dL (ref 6–23)
CALCIUM ION: 1.21 mmol/L (ref 1.12–1.23)
CREATININE: 0.7 mg/dL (ref 0.50–1.10)
Calcium, Ion: 1.12 mmol/L (ref 1.12–1.23)
Chloride: 100 mEq/L (ref 96–112)
Chloride: 105 mEq/L (ref 96–112)
Creatinine, Ser: 1 mg/dL (ref 0.50–1.10)
Glucose, Bld: 112 mg/dL — ABNORMAL HIGH (ref 70–99)
Glucose, Bld: 92 mg/dL (ref 70–99)
HCT: 41 % (ref 36.0–46.0)
HEMATOCRIT: 45 % (ref 36.0–46.0)
HEMOGLOBIN: 13.9 g/dL (ref 12.0–15.0)
Hemoglobin: 15.3 g/dL — ABNORMAL HIGH (ref 12.0–15.0)
Potassium: 4.4 mEq/L (ref 3.7–5.3)
Potassium: 4 mEq/L (ref 3.7–5.3)
SODIUM: 133 meq/L — AB (ref 137–147)
SODIUM: 140 meq/L (ref 137–147)
TCO2: 23 mmol/L (ref 0–100)
TCO2: 25 mmol/L (ref 0–100)

## 2013-02-18 LAB — GLUCOSE, CAPILLARY: Glucose-Capillary: 85 mg/dL (ref 70–99)

## 2013-02-18 MED ORDER — METAXALONE 400 MG HALF TABLET
400.0000 mg | ORAL_TABLET | Freq: Three times a day (TID) | ORAL | Status: AC
  Administered 2013-02-18: 17:00:00 via ORAL
  Administered 2013-02-19 – 2013-02-21 (×8): 400 mg via ORAL
  Filled 2013-02-18 (×9): qty 1

## 2013-02-18 MED ORDER — OXYCODONE-ACETAMINOPHEN 5-325 MG PO TABS
1.0000 | ORAL_TABLET | Freq: Once | ORAL | Status: AC
  Administered 2013-02-18: 1 via ORAL
  Filled 2013-02-18: qty 1

## 2013-02-18 MED ORDER — METRONIDAZOLE 250 MG PO TABS
250.0000 mg | ORAL_TABLET | Freq: Two times a day (BID) | ORAL | Status: DC
  Administered 2013-02-18 – 2013-02-21 (×6): 250 mg via ORAL
  Filled 2013-02-18 (×10): qty 1

## 2013-02-18 MED ORDER — DIAZEPAM 5 MG PO TABS
10.0000 mg | ORAL_TABLET | Freq: Once | ORAL | Status: AC
  Administered 2013-02-18: 10 mg via ORAL
  Filled 2013-02-18: qty 2

## 2013-02-18 MED ORDER — OXYCODONE-ACETAMINOPHEN 5-325 MG PO TABS: 1.0000 | ORAL_TABLET | Freq: Once | ORAL | Status: DC

## 2013-02-18 MED ORDER — NICOTINE POLACRILEX 2 MG MT GUM
2.0000 mg | CHEWING_GUM | OROMUCOSAL | Status: DC | PRN
  Administered 2013-02-19 – 2013-02-21 (×5): 2 mg via ORAL
  Filled 2013-02-18 (×6): qty 1

## 2013-02-18 MED ORDER — DIAZEPAM 5 MG PO TABS: 10.0000 mg | ORAL_TABLET | Freq: Three times a day (TID) | ORAL | Status: DC | PRN

## 2013-02-18 MED ORDER — NAPROXEN 500 MG PO TABS
500.0000 mg | ORAL_TABLET | Freq: Three times a day (TID) | ORAL | Status: DC
  Administered 2013-02-18 – 2013-02-19 (×3): 500 mg via ORAL
  Filled 2013-02-18 (×10): qty 1

## 2013-02-18 MED ORDER — KETOROLAC TROMETHAMINE 60 MG/2ML IM SOLN
30.0000 mg | Freq: Once | INTRAMUSCULAR | Status: AC
  Administered 2013-02-18: 30 mg via INTRAMUSCULAR
  Filled 2013-02-18: qty 2
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
## 2013-02-18 NOTE — ED Notes (Signed)
Bed: KD98 Expected date:  Expected time:  Means of arrival:  Comments: From [facility], syncopal episode

## 2013-02-18 NOTE — ED Provider Notes (Addendum)
CSN: [id number]     Arrival date & time 02/18/13  0551 History   First MD Initiated Contact with Patient 02/18/13 0700     Chief Complaint  Patient presents with  . Loss of Consciousness   (Consider location/radiation/quality/duration/timing/severity/associated sxs/prior Treatment) Patient is a 33 y.o. female presenting with syncope. The history is provided by the patient.  Loss of Consciousness Episode history:  Single Most recent episode:  Today Timing:  Constant Progression:  Resolved Chronicity:  New Context: standing up   Context comment:  Has been on clonidine for opiate withdrawal  Witnessed: yes   Relieved by:  Lying down Worsened by:  Nothing tried Ineffective treatments:  None tried Associated symptoms: headaches   Associated symptoms: no confusion, no difficulty breathing, no nausea, no shortness of breath, no vomiting and no weakness   Associated symptoms comment:  Neck pain and lower back pain Risk factors: no congenital heart disease, no coronary artery disease and no seizures     Past Medical History  Diagnosis Date  . Bipolar 1 disorder   . Depression   . PTSD (post-traumatic stress disorder)   . ADD (attention deficit disorder)   . ADHD (attention deficit hyperactivity disorder)   . Bipolar depression   . Arthritis   . Degenerative disc disease    History reviewed. No pertinent past surgical history. Family History  Problem Relation Age of Onset  . Cancer Father    History  Substance Use Topics  . Smoking status: Current Every Day Smoker -- 0.14 packs/day for 17 years    Types: Cigarettes  . Smokeless tobacco: Never Used  . Alcohol Use: Yes     Comment: occasionally   OB History   Grav Para Term Preterm Abortions TAB SAB Ect Mult Living                 Review of Systems  Respiratory: Negative for shortness of breath.   Cardiovascular: Positive for syncope.  Gastrointestinal: Negative for nausea and vomiting.  Neurological: Positive for  headaches. Negative for weakness.  Psychiatric/Behavioral: Negative for confusion.  All other systems reviewed and are negative.    Allergies  Prednisone  Home Medications   Current Outpatient Rx  Name  Route  Sig  Dispense  Refill  . eszopiclone (LUNESTA) 1 MG TABS tablet   Oral   Take 1 mg by mouth at bedtime as needed for sleep. Take immediately before bedtime.  Uncertain about milligram dosage.         Marland Kitchen levonorgestrel (MIRENA) 20 MCG/24HR IUD      Continue this contraceptive as directed by your primary physician.   1 each      . EXPIRED: lithium carbonate (LITHOBID) 300 MG CR tablet   Oral   Take 1 tablet (300 mg total) by mouth every 12 (twelve) hours. For mood stability.   60 tablet   0   . lithium carbonate 300 MG capsule   Oral   Take 300 mg by mouth daily.         . methocarbamol (ROBAXIN) 500 MG tablet   Oral   Take 500 mg by mouth every 4 (four) hours as needed for muscle spasms.         Marland Kitchen EXPIRED: mirtazapine (REMERON SOL-TAB) 15 MG disintegrating tablet   Oral   Take 1 tablet (15 mg total) by mouth at bedtime and may repeat dose one time if needed. For depression and helps with sleep.   30 tablet   0  BP 109/71  Pulse 69  Temp(Src) 97.6 F (36.4 C) (Oral)  Resp 18  Ht 5\' 5"  (1.651 m)  Wt 140 lb (63.504 kg)  BMI 23.30 kg/m2  SpO2 96% Physical Exam  Nursing note and vitals reviewed. Constitutional: She is oriented to person, place, and time. She appears well-developed and well-nourished. No distress.  HENT:  Head: Normocephalic. Head is with contusion.    Eyes: EOM are normal. Pupils are equal, round, and reactive to light.  Neck: Spinous process tenderness and muscular tenderness present.    Cardiovascular: Normal rate, regular rhythm, normal heart sounds and intact distal pulses.  Exam reveals no friction rub.   No murmur heard. Pulmonary/Chest: Effort normal and breath sounds normal. She has no wheezes. She has no rales.   Abdominal: Soft. Bowel sounds are normal. She exhibits no distension. There is no tenderness. There is no rebound and no guarding.  Musculoskeletal: Normal range of motion. She exhibits no tenderness.  No edema  Neurological: She is alert and oriented to person, place, and time. She has normal strength. No cranial nerve deficit or sensory deficit.  Skin: Skin is warm and dry. No rash noted.  Psychiatric: She has a normal mood and affect. Her behavior is normal.    ED Course  Procedures (including critical care time) Labs Review Labs Reviewed  TSH  GLUCOSE, CAPILLARY   Imaging Review Dg Lumbar Spine Complete  02/18/2013   CLINICAL DATA:  Fall with subsequent back and leg pain  EXAM: LUMBAR SPINE - COMPLETE 4+ VIEW  COMPARISON:  01/14/2013  FINDINGS: Mild curvature convex to the right is again noted. There is mild disc space narrowing at L5-S1. There is facet hypertrophy at L4-5 and L5-S1. Sacroiliac joints appear normal. No evidence of fracture.  IMPRESSION: No change. Mild lumbar curvature. Mild degenerative changes in the lower lumbar spine. No acute or traumatic finding.   Electronically Signed   By: Nelson Chimes M.D.   On: 02/18/2013 07:43   Ct Head Wo Contrast  02/18/2013   CLINICAL DATA:  Syncopal episode with trauma to the head and neck.  EXAM: CT HEAD WITHOUT CONTRAST  CT CERVICAL SPINE WITHOUT CONTRAST  TECHNIQUE: Multidetector CT imaging of the head and cervical spine was performed following the standard protocol without intravenous contrast. Multiplanar CT image reconstructions of the cervical spine were also generated.  COMPARISON:  09/03/2009  FINDINGS: CT HEAD FINDINGS  The brain has a normal appearance without evidence of malformation, atrophy, old or acute infarction, mass lesion, hemorrhage, hydrocephalus or extra-axial collection. No skull fracture. No fluid in the sinuses, middle ears or mastoids.  CT CERVICAL SPINE FINDINGS  Alignment is normal. No soft tissue swelling. No  fracture. There is ordinary spondylosis at C5-6 and C6-7 with osteophytes encroaching upon the neural foramina.  IMPRESSION: Head CT:  Normal  C-spine CT: No acute or traumatic finding. Spondylosis C5-6 and C6-7 with some foraminal encroachment due to osteophytes.   Electronically Signed   By: Nelson Chimes M.D.   On: 02/18/2013 08:27   Ct Cervical Spine Wo Contrast  02/18/2013   CLINICAL DATA:  Syncopal episode with trauma to the head and neck.  EXAM: CT HEAD WITHOUT CONTRAST  CT CERVICAL SPINE WITHOUT CONTRAST  TECHNIQUE: Multidetector CT imaging of the head and cervical spine was performed following the standard protocol without intravenous contrast. Multiplanar CT image reconstructions of the cervical spine were also generated.  COMPARISON:  09/03/2009  FINDINGS: CT HEAD FINDINGS  The brain has a  normal appearance without evidence of malformation, atrophy, old or acute infarction, mass lesion, hemorrhage, hydrocephalus or extra-axial collection. No skull fracture. No fluid in the sinuses, middle ears or mastoids.  CT CERVICAL SPINE FINDINGS  Alignment is normal. No soft tissue swelling. No fracture. There is ordinary spondylosis at C5-6 and C6-7 with osteophytes encroaching upon the neural foramina.  IMPRESSION: Head CT:  Normal  C-spine CT: No acute or traumatic finding. Spondylosis C5-6 and C6-7 with some foraminal encroachment due to osteophytes.   Electronically Signed   By: Nelson Chimes M.D.   On: 02/18/2013 08:27    EKG Interpretation    Date/Time:  Wednesday February 18 2013 06:02:50 EST Ventricular Rate:  61 PR Interval:  179 QRS Duration: 93 QT Interval:  408 QTC Calculation: 411 R Axis:   69 Text Interpretation:  Sinus rhythm Nonspecific ST abnormality No significant change since last tracing Confirmed by OPITZ  MD, [place] [phone]) on 02/18/2013 6:19:11 AM            MDM   1. Opioid dependence   2. Cocaine abuse   3. Mood disorder   4. Polysubstance abuse   5. Syncope   6.  Scalp contusion   7. Cervical strain    Patient presenting behavioral health after a syncopal episode today. Patient has been getting clonidine twice a day for opiate withdrawal her last dose was last night prior to bed she got up and walked to nurse's station to get some medication and lost consciousness falling backward hitting her head on the floor. Based on recent vital signs done in in this facility patient has been relatively hypotensive for the last 2 days in the mid 90s which is most likely a result of the clonidine that she's taking for opiate withdrawal. Currently here her blood pressure is 109/71 supine. Patient is awake and alert and neurovascularly intact. She has a hematoma to the left scalp trauma C-spine tenderness and L-spine tenderness. She is moving upper and lower extremities without difficulty and has normal sensation.  CT of the head, C-spine and L. spine films are pending. Urine pregnancy test from 2 days ago and was negative.  I-STAT pending.  8:36 AM Imaging neg and c-spine cleared will give valium for muscle spasm.  Pt will continue NSAIDs at [facility] for pain.  I-stat and EKG wnl.  Will d/c back to [place], MD 02/18/13 [place], MD 02/18/13 [phone]

## 2013-02-18 NOTE — BHH Group Notes (Signed)
[place] LCSW Group Therapy  02/18/2013 3:01 PM  Type of Therapy:  Group Therapy  Participation Level:  Did Not Attend-pt in bed resting/recently was readmitted after going to ED this morning due to fall/lose of consciousness.   Smart, Consetta Cosner LCSWA  02/18/2013, 3:01 PM

## 2013-02-18 NOTE — Progress Notes (Signed)
Recreation Therapy Notes  Date: 01.14.2015 Time: 3:00 Location: [address]   Group Topic: Communication, Team Building, Problem Solving  Goal Area(s) Addresses:  Patient will effectively work with peer towards shared goal.  Patient will identify skill used to make activity successful.  Patient will identify how skills used during activity can be used to reach post d/c goals.   Behavioral Response: Did not attend.   Laureen Ochs Azael Ragain, LRT/CTRS  Lane Hacker 02/18/2013 4:53 PM

## 2013-02-18 NOTE — Progress Notes (Signed)
Patient ID: [name], female   DOB: January 06, 1981, 33 y.o.   MRN: [id number] [facility] MD Progress Note  02/18/2013 2:27 PM DENISHA HOEL  MRN:  [id number]  Subjective:  [name] says she is having a lot of anxiety and pain episodes. She states that she got up to go ask for medicine for pain around 5:00 am this morning, was standing by the window to the medication room. The next thing she remembered was waking up and seeing the the EMS working on her. She is currently complaining of muscle pain to right neck and shoulder areas, rated her pain at #4. She adds that her anxiety is @ #8 due to pain and her depression at #5-6. [name] reports that she was diagnosed with vertigo a long time ago by her doctor who told her that from time to time, she will experience unexpected fainting/dizzy spells. Is complaining of vaginal discharge with foul odor to it. She denies any SIHI, AVH.  Diagnosis:   DSM5: Schizophrenia Disorders:  none Obsessive-Compulsive Disorders:  none Trauma-Stressor Disorders:  none Substance/Addictive Disorders:  Opioid Disorder - Moderate (304.00), Cocaine use disorders Depressive Disorders:  Major Depressive Disorder - Moderate (296.22)  Axis I: Substance Induced Mood Disorder  ADL's:  Intact  Sleep: Poor  Appetite:  Fair  Suicidal Ideation:  Plan:  denies Intent:  denies Means:  denies  Homicidal Ideation:  Plan:  denies Intent:  denies Means:  denies AEB (as evidenced by):  Psychiatric Specialty Exam: Review of Systems  Constitutional: Negative.   HENT: Negative.   Eyes: Negative.   Respiratory: Negative.   Cardiovascular: Negative.   Gastrointestinal: Negative.   Genitourinary: Negative.   Musculoskeletal: Negative.   Skin: Negative.   Neurological: Negative.   Endo/Heme/Allergies: Negative.   Psychiatric/Behavioral: Positive for depression and substance abuse. The patient is nervous/anxious and has insomnia.     Blood pressure 107/52, pulse 74,  temperature 98 F (36.7 C), temperature source Oral, resp. rate 20, height 5\' 5"  (1.651 m), weight 63.504 kg (140 lb), SpO2 98.00%.Body mass index is 23.3 kg/(m^2).  General Appearance: Fairly Groomed  Engineer, water::  Fair  Speech:  Clear and Coherent  Volume:  Decreased  Mood:  Anxious, Depressed and worried  Affect:  anxious, worried  Thought Process:  Coherent and Goal Directed  Orientation:  Full (Time, Place, and Person)  Thought Content:  symptoms, worries, concerns  Suicidal Thoughts:  No  Homicidal Thoughts:  No  Memory:  Immediate;   Fair Recent;   Fair Remote;   Fair  Judgement:  Fair  Insight:  Present and Shallow  Psychomotor Activity:  Restlessness,  Concentration:  Fair  Recall:  Fair  Akathisia:  No  Handed:    AIMS (if indicated):     Assets:  Desire for Improvement  Sleep:  Number of Hours: 5.5   Current Medications: Current Facility-Administered Medications  Medication Dose Route Frequency Provider Last Rate Last Dose  . acetaminophen (TYLENOL) tablet 650 mg  650 mg Oral Q6H PRN Delfin Gant, NP   650 mg at 02/17/13 1309  . alum & mag hydroxide-simeth (MAALOX/MYLANTA) 200-200-20 MG/5ML suspension 30 mL  30 mL Oral Q4H PRN Delfin Gant, NP   30 mL at 02/17/13 0833  . cloNIDine (CATAPRES) tablet 0.1 mg  0.1 mg Oral QAC breakfast Waylan Boga, NP      . dicyclomine (BENTYL) tablet 20 mg  20 mg Oral Q6H PRN Delfin Gant, NP   20 mg at 02/13/13  1849  . DULoxetine (CYMBALTA) DR capsule 30 mg  30 mg Oral Daily Nicholaus Bloom, MD   30 mg at 02/18/13 1301  . gabapentin (NEURONTIN) capsule 600 mg  600 mg Oral TID Nicholaus Bloom, MD   600 mg at 02/18/13 1259  . hydrOXYzine (ATARAX/VISTARIL) tablet 25 mg  25 mg Oral Q6H PRN Delfin Gant, NP   25 mg at 02/17/13 2207  . ibuprofen (ADVIL,MOTRIN) tablet 600 mg  600 mg Oral Q8H PRN Delfin Gant, NP   600 mg at 02/17/13 2208  . lamoTRIgine (LAMICTAL) tablet 25 mg  25 mg Oral BID Waylan Boga, NP   25  mg at 02/17/13 1647  . lidocaine (LIDODERM) 5 % 1 patch  1 patch Transdermal Daily Encarnacion Slates, NP   1 patch at 02/18/13 0830  . loperamide (IMODIUM) capsule 2-4 mg  2-4 mg Oral PRN Delfin Gant, NP      . magnesium hydroxide (MILK OF MAGNESIA) suspension 30 mL  30 mL Oral Daily PRN Delfin Gant, NP      . metaxalone (SKELAXIN) tablet 400 mg  400 mg Oral TID Encarnacion Slates, NP      . methocarbamol (ROBAXIN) tablet 500 mg  500 mg Oral Q8H PRN Delfin Gant, NP   500 mg at 02/17/13 1439  . naproxen (NAPROSYN) tablet 500 mg  500 mg Oral BID PRN Delfin Gant, NP   500 mg at 02/17/13 1438  . nicotine (NICODERM CQ - dosed in mg/24 hours) patch 21 mg  21 mg Transdermal Daily Delfin Gant, NP   21 mg at 02/18/13 0830  . ondansetron (ZOFRAN) tablet 4 mg  4 mg Oral Q8H PRN Delfin Gant, NP      . ondansetron (ZOFRAN-ODT) disintegrating tablet 4 mg  4 mg Oral Q6H PRN Delfin Gant, NP      . ramelteon (ROZEREM) tablet 8 mg  8 mg Oral QHS Nicholaus Bloom, MD   8 mg at 02/17/13 2207  . zolpidem (AMBIEN) tablet 10 mg  10 mg Oral QHS PRN Nicholaus Bloom, MD   10 mg at 02/17/13 2208    Lab Results:  Results for orders placed during the hospital encounter of 02/13/13 (from the past 48 hour(s))  GLUCOSE, CAPILLARY     Status: None   Collection Time    02/18/13  5:10 AM      Result Value Range   Glucose-Capillary 85  70 - 99 mg/dL  POCT I-STAT, CHEM 8     Status: Abnormal   Collection Time    02/18/13  7:04 AM      Result Value Range   Sodium 133 (*) 137 - 147 mEq/L   Potassium 4.4  3.7 - 5.3 mEq/L   Chloride 100  96 - 112 mEq/L   BUN 11  6 - 23 mg/dL   Creatinine, Ser 0.70  0.50 - 1.10 mg/dL   Glucose, Bld 112 (*) 70 - 99 mg/dL   Calcium, Ion 1.12  1.12 - 1.23 mmol/L   TCO2 23  0 - 100 mmol/L   Hemoglobin 15.3 (*) 12.0 - 15.0 g/dL   HCT 45.0  36.0 - 46.0 %  POCT I-STAT, CHEM 8     Status: None   Collection Time    02/18/13  8:42 AM      Result Value Range    Sodium 140  137 - 147 mEq/L   Potassium 4.0  3.7 - 5.3 mEq/L   Chloride 105  96 - 112 mEq/L   BUN 21  6 - 23 mg/dL   Creatinine, Ser 1.00  0.50 - 1.10 mg/dL   Glucose, Bld 92  70 - 99 mg/dL   Calcium, Ion 1.21  1.12 - 1.23 mmol/L   TCO2 25  0 - 100 mmol/L   Hemoglobin 13.9  12.0 - 15.0 g/dL   HCT 41.0  36.0 - 46.0 %    Physical Findings: AIMS: Facial and Oral Movements Muscles of Facial Expression: None, normal Lips and Perioral Area: None, normal Jaw: None, normal Tongue: None, normal,Extremity Movements Upper (arms, wrists, hands, fingers): None, normal Lower (legs, knees, ankles, toes): None, normal, Trunk Movements Neck, shoulders, hips: None, normal, Overall Severity Severity of abnormal movements (highest score from questions above): None, normal Incapacitation due to abnormal movements: None, normal Patient's awareness of abnormal movements (rate only patient's report): No Awareness, Dental Status Current problems with teeth and/or dentures?: No Does patient usually wear dentures?: No  CIWA:  CIWA-Ar Total: 2 COWS:  COWS Total Score: 6  Treatment Plan Summary: Daily contact with patient to assess and evaluate symptoms and progress in treatment Medication management  Plan: Supportive approach/coping skills/relapse prevention. Initiate Skelaxin 400 mg tid x 3 days for muscle pain. Flagyl 250 mg bid for vaginal yeast infections. Conrinue Neurontin 600 mg for substance withdrawal syndrome and Cymbalta 30 mg for depression/pain management Continue Rozerem 8 mg HS for insomnia.  Medical Decision Making Problem Points:  Review of psycho-social stressors (1) Data Points:  Review of new medications or change in dosage (2)  I certify that inpatient services furnished can reasonably be expected to improve the patient's condition.   Lindell Spar I, PMHNP-BC 02/18/2013, 2:27 PM

## 2013-02-18 NOTE — Progress Notes (Signed)
Patient ID: [name], female   DOB: 1980-08-09, 33 y.o.   MRN: [id number] She has been in bed seance return from ED. She c/o pain neck area and of bump on left back side of head. There is a raised area no redness or discolor noted. She has been alert verbal orientated x 4. Has not requested andy pain medication, fluids have been encouraged.

## 2013-02-18 NOTE — ED Notes (Signed)
[facility] made aware of need to transport pt to [facility]

## 2013-02-18 NOTE — Progress Notes (Signed)
Attended Group

## 2013-02-18 NOTE — Progress Notes (Signed)
D: pt c/o of head and neck hurting due too fall this morning. Writer gave pt 2 ice packs. Pt stated that tylenol and advil isnt strong enough for pain. Pt asking for valium and tramadol. PA notified, order for naproxen made. Pt stated naproxen is the reason she fell to begin with. Pt is med seeking and constantly asking for something. Denies si/hi/avh.  A: naproxen given for pain along with ice and heat packs. q 15 min safety checks R: pt remains safe on unit

## 2013-02-18 NOTE — Progress Notes (Signed)
D   Pt attended group this evening and participated   She was trying to get medication during group time but after instruction and encouragement pt turned her attention to group activities and was able to completely distract herself from med seeking behaviors   She was much brighter and engaged after groups   Although she still endorses anxiety  A   Verbal support given   Medications administered and effectiveness monitored   Continue to encourage distraction from med seeking   Q 15 min checks   Educated on other coping skills to use when wanting medications R   Pt safe at present and verbalized understanding

## 2013-02-18 NOTE — ED Notes (Addendum)
Pt from [facility] where RN witnessed pt have syncopal episode for an estimated 10 secs. Witnesses state pt hit her head on floor. Pt states she feels lightheaded with neck,back and head pain. Pt immobilized due to inability to pass SCCA (unable to lift leg). Pt in soft collar.Sitter at bedside.
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
## 2013-02-18 NOTE — Progress Notes (Signed)
Patient ID: [name], female   DOB: 23-Oct-1980, 33 y.o.   MRN: [id number] Late entry for 10:30  She has returned from [facility]. She is alert verbal , in bed with Ice pack on neck.

## 2013-02-19 MED ORDER — IBUPROFEN 800 MG PO TABS
800.0000 mg | ORAL_TABLET | Freq: Three times a day (TID) | ORAL | Status: DC
  Administered 2013-02-19 – 2013-02-21 (×6): 800 mg via ORAL
  Filled 2013-02-19 (×13): qty 1

## 2013-02-19 MED ORDER — MECLIZINE HCL 25 MG PO TABS
25.0000 mg | ORAL_TABLET | Freq: Three times a day (TID) | ORAL | Status: DC | PRN
  Filled 2013-02-19: qty 1

## 2013-02-19 MED ORDER — MECLIZINE HCL 25 MG PO TABS
25.0000 mg | ORAL_TABLET | Freq: Three times a day (TID) | ORAL | Status: DC
  Administered 2013-02-19 – 2013-02-21 (×7): 25 mg via ORAL
  Filled 2013-02-19 (×9): qty 1

## 2013-02-19 NOTE — Progress Notes (Signed)
D:Pt rates her depression as a 6 on 1-10 scale with 10 being the most depressed. Pt c/o neck pain and was given scheduled medications along with pain patch placed on her neck. She reports that pain decreased from a 10 to an 8. Pt has c/o dizziness and was in her bed much of the early morning. A:Offered support and 15 minute checks. Educated pt on fall safety. Call bell within reach. R:Pt denies si and hi. Safety maintained on the unit.

## 2013-02-19 NOTE — Progress Notes (Signed)
Pt has been c/o pain all evening. Rating it 10/10. Pt c/o of dizziness, throbbing pain, and shooting pain down her neck and spine. Pt is asking for some stronger medication that tylenol and Advil. PA notified. Pt denies si/hi/avh. Pt is cooperative and med seeking A: Advil 800mg  given, q15 min safety checks. Support and encouragement offered R: pt remains safe on unit. No signs of distress or further complaints at this time

## 2013-02-19 NOTE — Progress Notes (Signed)
Recreation Therapy Notes  Animal-Assisted Activity/Therapy (AAA/T) Program Checklist/Progress Notes Patient Eligibility Criteria Checklist & Daily Group note for Rec Tx Intervention  Date: 01.15.2015 Time: 2:45pm Location: 9 hall dayroom    AAA/T Program Assumption of Risk Form signed by Patient/ or Parent Legal Guardian yes  Patient is free of allergies or sever asthma yes  Patient reports no fear of animals yes  Patient reports no history of cruelty to animals yes   Patient understands his/her participation is voluntary yes  Patient washes hands before animal contact yes  Patient washes hands after animal contact yes  Behavioral Response: Engaged, Approrpaite  Education: Contractor, Appropriate Animal Interaction   Education Outcome: Acknowledges understanding   Clinical Observations/Feedback: Patient interacted appropriately with therapy dog team, peers and LRT.    Laureen Ochs Iley Breeden, LRT/CTRS  Lane Hacker 02/19/2013 4:32 PM

## 2013-02-19 NOTE — Progress Notes (Signed)
Patient ID: [name], female   DOB: 14-Dec-1980, 33 y.o.   MRN: [id number] Pt attended [place] meeting.

## 2013-02-19 NOTE — BHH Group Notes (Signed)
[facility] Group Notes:  (Nursing/MHT/Case Management/Adjunct)  Date:  02/19/2013  Time:  4:00 PM  Type of Therapy:  Nurse Education  Participation Level:  Did Not Attend  Participation Quality:  Did not attend Affect: did not attend  Cognitive: Insight: did not attend Engagement in Group:  Did not attend Modes of Intervention: did not attend  Summary of Progress/Problems: Pt did not attend group  Mosie Lukes 02/19/2013, 4:00 PM
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
## 2013-02-19 NOTE — BHH Group Notes (Signed)
[facility] LCSW Group Therapy  02/19/2013 2:57 PM  Type of Therapy:  Group Therapy  Participation Level:  Active  Participation Quality:  Attentive  Affect:  Appropriate  Cognitive:  Alert and Oriented  Insight:  Engaged  Engagement in Therapy:  Engaged  Modes of Intervention:  Confrontation, Discussion, Education, Exploration, Problem-solving, Socialization and Support  Summary of Progress/Problems:  Finding Balance in Life. Today's group focused on defining balance in one's own words, identifying things that can knock one off balance, and exploring healthy ways to maintain balance in life. Group members were asked to provide an example of a time when they felt off balance, describe how they handled that situation,and process healthier ways to regain balance in the future. Group members were asked to share the most important tool for maintaining balance that they learned while at [facility] and how they plan to apply this method after discharge. [name] was attentive and engaged throughout today's therapy group. She shared that a balanced life would mean "more security financially and having help in raising my children so that I'm not always 100% responsible for caretaking." [name] identified some support that have helped her recently in taking care of her young children. [name] shows progress in the group setting and improving insight AEB her ability to identify how "having people that I trust help me with watching my kids so that I can leave and go to [place] meetings every night" will help her with reestablishing a sense of balance in life. "I need to focus on recovery first or else all other areas in my life will be off balance."    Smart, [name] 02/19/2013, 2:57 PM
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
## 2013-02-20 MED ORDER — LAMOTRIGINE 25 MG PO TABS
50.0000 mg | ORAL_TABLET | Freq: Two times a day (BID) | ORAL | Status: DC
  Administered 2013-02-20 – 2013-02-21 (×2): 50 mg via ORAL
  Filled 2013-02-20 (×3): qty 2
  Filled 2013-02-20 (×2): qty 12
  Filled 2013-02-20: qty 2

## 2013-02-20 MED ORDER — DULOXETINE HCL 20 MG PO CPEP
40.0000 mg | ORAL_CAPSULE | Freq: Every day | ORAL | Status: DC
  Administered 2013-02-21: 40 mg via ORAL
  Filled 2013-02-20: qty 2
  Filled 2013-02-20: qty 6
  Filled 2013-02-20: qty 2
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
## 2013-02-20 NOTE — BHH Group Notes (Signed)
[place] LCSW Group Therapy  02/20/2013 3:08 PM  Type of Therapy:  Group Therapy  Participation Level:  Minimal  Participation Quality:  Attentive  Affect:  Appropriate  Cognitive:  Alert  Insight:  Improving  Engagement in Therapy:  Improving  Modes of Intervention:  Confrontation, Discussion, Education, Exploration, Problem-solving, Socialization and Support  Summary of Progress/Problems: Feelings around Relapse. Group members discussed the meaning of relapse and shared personal stories of relapse, how it affected them and others, and how they perceived themselves during this time. Group members were encouraged to identify triggers, warning signs and coping skills used when facing the possibility of relapse. Social supports were discussed and explored in detail. Post Acute Withdrawal Syndrome (handout provided) was introduced and examined. Pt's were encouraged to ask questions, talk about key points associated with PAWS, and process this information in terms of relapse prevention. Reyana came to group late (arriving within the last ten minutes). She actively listened as others shared their experiences with PAWS and identified healthy ways to cope with PAWS. Samiksha did not actively participate in group discussion but read the handout provided by CSW.    Smart, HeatherLCSWA  02/20/2013, 3:08 PM
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
## 2013-02-20 NOTE — Progress Notes (Signed)
D Pt. Is somatic and repeats multiple times how she fell and hit her head and how she feels  Dizzy.   Pt. Denies SI and HI. Pt. Questions the medications she is taking and we discuss them.   A Writer offered support and encouragement.  Discussed discharge plans and coping skills with the pt. Writer reminded pt. Of the fall risk discussion from admission reminding pt. To not stand when dizzy and let us know when she needs assistance.  To get up from sitting position to a standing position slowly.  R Pt. Remains safe on the unit.  Pt. States that she will be going to live with a 31 year old female that is a member of the church she attends.  Pt. States she is opening her home to the pt. And her two children.  Pt. Likes attention from staff and her peers, as she complains about taking so much medications yet she request it.
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
## 2013-02-20 NOTE — Progress Notes (Signed)
[facility] Adult Case Management Discharge Plan :  Will you be returning to the same living situation after discharge: No.Pt plans to live with church friend At discharge, do you have transportation home?:Yes,  church friend plans to pick pt up at d/c Saturday if stable.  Do you have the ability to pay for your medications:Yes,  Medicaid  Release of information consent forms completed and submitted to Medical Records by CSW.   Patient to Follow up at: Follow-up Information   Follow up with Tamela Gammon On 02/24/2013. (Appt. for hospital followup/assessment for therapy services at 8:30AM on this date. )    Contact information:   [address] [place], [place] [address] Phone: [phone] Fax: [phone]      Patient denies SI/HI:   Yes,  during group/self report.    Safety Planning and Suicide Prevention discussed:  Yes,  SPE completed with pt as she did not consent to family contact. SPI pamphlet provided to pt and she was encouraged to share this with her support network, ask questions, and talk about any concerns relating to SPE.  Smart, Altin Sease LCSWA  02/20/2013, 10:39 AM
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
## 2013-02-20 NOTE — Progress Notes (Signed)
D: Patient denies SI/HI/AVH.  Pt. Reports that she is feeling somewhat better, but still reports poor sleep.  She states that her appetite has been improving.  Pt. Rates her depression as 6/10 and hopelessness is 8/10.  A: Patient given emotional support from RN. Patient encouraged to come to staff with concerns and/or questions. Patient's medication routine continued. Patient's orders and plan of care reviewed.   R: Patient remains appropriate and cooperative. Will continue to monitor patient q15 minutes for safety.  Pt. Is appropriate with staff and interacting with others within the milieu.

## 2013-02-20 NOTE — Progress Notes (Signed)
Recreation Therapy Notes  Date: 01.16.2015 Time: 3:00pm Location: [address] Dayroom  Group Topic: Leisure Education  Goal Area(s) Addresses:  Patient will identify positive leisure activities.  Patient will identify one positive benefit of participation in leisure activities.   Behavioral Response: Did not attend.   Laureen Ochs Beaux Wedemeyer, LRT/CTRS  Lane Hacker 02/20/2013 3:54 PM

## 2013-02-21 MED ORDER — RAMELTEON 8 MG PO TABS: 8.0000 mg | ORAL_TABLET | Freq: Every day | ORAL | Status: DC

## 2013-02-21 MED ORDER — MECLIZINE HCL 25 MG PO TABS: 25.0000 mg | ORAL_TABLET | Freq: Three times a day (TID) | ORAL | Status: DC | PRN

## 2013-02-21 MED ORDER — ZOLPIDEM TARTRATE 10 MG PO TABS: 10.0000 mg | ORAL_TABLET | Freq: Every evening | ORAL | Status: DC | PRN

## 2013-02-21 MED ORDER — METRONIDAZOLE 250 MG PO TABS: 250.0000 mg | ORAL_TABLET | Freq: Two times a day (BID) | ORAL | Status: DC

## 2013-02-21 MED ORDER — GABAPENTIN 300 MG PO CAPS
600.0000 mg | ORAL_CAPSULE | Freq: Three times a day (TID) | ORAL | Status: DC
Start: 2013-02-21 — End: 2013-06-03

## 2013-02-21 MED ORDER — LIDOCAINE 5 % EX PTCH: 1.0000 | MEDICATED_PATCH | Freq: Every day | CUTANEOUS | Status: DC

## 2013-02-21 MED ORDER — DULOXETINE HCL 20 MG PO CPEP: 40.0000 mg | ORAL_CAPSULE | Freq: Every day | ORAL | Status: DC

## 2013-02-21 MED ORDER — LEVONORGESTREL 20 MCG/24HR IU IUD: INTRAUTERINE_SYSTEM | INTRAUTERINE | Status: DC

## 2013-02-21 MED ORDER — LAMOTRIGINE 25 MG PO TABS: 50.0000 mg | ORAL_TABLET | Freq: Two times a day (BID) | ORAL | Status: DC
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
## 2013-02-21 NOTE — BHH Group Notes (Signed)
[facility] Group Notes:  (Nursing/MHT/Case Management/Adjunct)  Date:  02/21/2013  Time:  11:15 AM  Type of Therapy:  Psychoeducational Skills  Participation Level:  Did Not Attend  Participation Quality:  did not attend  Affect:  did not attend  Cognitive:  did not attend  Insight:  None  Engagement in Group:  did not attend  Modes of Intervention:  did not attend  Summary of Progress/Problems:did not attend  Orlin Hilding 02/21/2013, 11:15 AM

## 2013-02-21 NOTE — Progress Notes (Signed)
Pt attended AA group this evening.  

## 2013-02-21 NOTE — Progress Notes (Signed)
Patient ID: [name], female   DOB: 09-Oct-1980, 33 y.o.   MRN: [id number] Pt discharged to home with friend from church.  Discharge instructions both verbal and written to pt with verbalization of understanding.  Discharge instructions to include medications, NAMI website, follow up care, suicide safety prevention, and community resource list.  All belongings in pts possession and signed for.  MD talked and assessed pt prior to discharge answering any questions or concerns.  No distress noted at discharge and nicotine patch and lidocaine patch removed.  Denies HI/SI, auditory or visual hallucinations on discharge.  Pt and family church friend excited and ready for discharge, with no further questions.  Pt escorted to lobby for discharge.

## 2013-02-21 NOTE — BHH Suicide Risk Assessment (Signed)
Suicide Risk Assessment  Discharge Assessment     Demographic Factors:  Female  Mental Status Per Nursing Assessment::   On Admission:     Current Mental Status by Physician: Alert oriented, cooperative and not depressed. Not Psychotic and improved insight.  Loss Factors: Decrease in vocational status, Financial problems/change in socioeconomic status and continued substance abuse  Historical Factors: Impulsivity and opiate use when depressed and withdrawn. Poor compliance  Risk Reduction Factors:   Sense of responsibility to family, Employed, Positive therapeutic relationship and Positive coping skills or problem solving skills  Continued Clinical Symptoms:  Dysthymia Alcohol/Substance Abuse/Dependencies More than one psychiatric diagnosis Previous Psychiatric Diagnoses and Treatments  Cognitive Features That Contribute To Risk:  Closed-mindedness Polarized thinking    Suicide Risk:  Minimal: No identifiable suicidal ideation.  Patients presenting with no risk factors but with morbid ruminations; may be classified as minimal risk based on the severity of the depressive symptoms  Discharge Diagnoses:   AXIS I:  Substance Abuse, Substance Induced Mood Disorder and Polysubstance dependence, Mood disorder unspecified. Opiate use diosrder, severe AXIS II:  Deferred AXIS III:   Past Medical History  Diagnosis Date  . Bipolar 1 disorder   . Depression   . PTSD (post-traumatic stress disorder)   . ADD (attention deficit disorder)   . ADHD (attention deficit hyperactivity disorder)   . Bipolar depression   . Arthritis   . Degenerative disc disease    AXIS IV:  economic problems, other psychosocial or environmental problems and problems related to social environment AXIS V:  61-70 mild symptoms  Plan Of Care/Follow-up recommendations:  Activity:  as tolerated Diet:  regular. Follow up with scheduled appointments and recommendations. Compliance with medications. See  Discharge summary for details of plan.  Continue medications at discharge as instructed.  Is patient on multiple antipsychotic therapies at discharge:  No   Has Patient had three or more failed trials of antipsychotic monotherapy by history:  No  Recommended Plan for Multiple Antipsychotic Therapies: NA  Kelton Bultman 02/21/2013, 10:04 AM

## 2013-02-21 NOTE — BHH Group Notes (Signed)
[facility] Group Notes:  (Nursing/MHT/Case Management/Adjunct)  Date:  02/21/2013  Time: 0900 am  Type of Therapy:  Psychoeducational Skills  Participation Level:  Did Not Attend   [name] 02/21/2013, 12:36 PM

## 2013-02-21 NOTE — BHH Group Notes (Signed)
[facility] Group Notes:  (Clinical Social Work)  02/21/2013     10-11AM  Summary of Progress/Problems:   The main focus of today's process group was for the patient to identify ways in which they have in the past sabotaged their own recovery. Motivational Interviewing and a worksheet were utilized to help patients explore the perceived benefits and costs of their substance use, as well as the potential benefits and costs of stopping.  The Stages of Change were explained using a handout, and patients identified where they currently are with regard to stages of change.  The patient expressed that if the use of substances were stopped, she would expect to feel more in controls of her life and her ability to work toward her goals such as improving relationships in her life, being a mother to her children.  She became nervous about the plan for her to go to [facility] when another member of the group who was recently discharged to [facility] came in and started talking about drug use there.    Type of Therapy:  Group Therapy - Process   Participation Level:  Active  Participation Quality:  Attentive, Sharing and Supportive  Affect:  Appropriate  Cognitive:  Oriented  Insight:  Engaged  Engagement in Therapy:  Engaged  Modes of Intervention:  Education, Psychiatric nurse, Motivational Interviewing  Selmer Dominion, LCSW 02/21/2013, 1:02 PM

## 2013-02-25 NOTE — Progress Notes (Signed)
Patient Discharge Instructions:  After Visit Summary (AVS):   Faxed to:  02/25/13 Discharge Summary Note:   Faxed to:  02/25/13 Psychiatric Admission Assessment Note:   Faxed to:  02/25/13 Suicide Risk Assessment - Discharge Assessment:   Faxed to:  02/25/13 Faxed/Sent to the Next Level Care provider:  02/25/13 Faxed to [facility] @ [place], 02/25/2013, 3:46 PM

## 2013-06-03 ENCOUNTER — Ambulatory Visit (HOSPITAL_COMMUNITY)
Admission: RE | Admit: 2013-06-03 | Discharge: 2013-06-03 | Disposition: A | Discharge status: Home / Self Care | Payer: 59 | Attending: Psychiatry | Admitting: Psychiatry

## 2013-06-03 ENCOUNTER — Encounter (HOSPITAL_COMMUNITY): Payer: Self-pay | Admitting: Emergency Medicine

## 2013-06-03 ENCOUNTER — Emergency Department (HOSPITAL_COMMUNITY)
Admission: EM | Admit: 2013-06-03 | Discharge: 2013-06-03 | Disposition: A | Discharge status: Home / Self Care | Payer: MEDICAID | Attending: Emergency Medicine | Admitting: Emergency Medicine

## 2013-06-03 DIAGNOSIS — Z792 Long term (current) use of antibiotics: Secondary | ICD-10-CM | POA: Diagnosis not present

## 2013-06-03 DIAGNOSIS — F329 Major depressive disorder, single episode, unspecified: Secondary | ICD-10-CM

## 2013-06-03 DIAGNOSIS — Z79899 Other long term (current) drug therapy: Secondary | ICD-10-CM | POA: Insufficient documentation

## 2013-06-03 DIAGNOSIS — F1420 Cocaine dependence, uncomplicated: Secondary | ICD-10-CM | POA: Insufficient documentation

## 2013-06-03 DIAGNOSIS — F319 Bipolar disorder, unspecified: Secondary | ICD-10-CM | POA: Diagnosis not present

## 2013-06-03 DIAGNOSIS — F1910 Other psychoactive substance abuse, uncomplicated: Secondary | ICD-10-CM

## 2013-06-03 DIAGNOSIS — M129 Arthropathy, unspecified: Secondary | ICD-10-CM | POA: Diagnosis not present

## 2013-06-03 DIAGNOSIS — F3289 Other specified depressive episodes: Secondary | ICD-10-CM

## 2013-06-03 DIAGNOSIS — F1410 Cocaine abuse, uncomplicated: Secondary | ICD-10-CM

## 2013-06-03 DIAGNOSIS — F4310 Post-traumatic stress disorder, unspecified: Secondary | ICD-10-CM | POA: Insufficient documentation

## 2013-06-03 DIAGNOSIS — F39 Unspecified mood [affective] disorder: Secondary | ICD-10-CM

## 2013-06-03 DIAGNOSIS — F17200 Nicotine dependence, unspecified, uncomplicated: Secondary | ICD-10-CM | POA: Diagnosis not present

## 2013-06-03 DIAGNOSIS — G479 Sleep disorder, unspecified: Secondary | ICD-10-CM | POA: Diagnosis not present

## 2013-06-03 DIAGNOSIS — F1120 Opioid dependence, uncomplicated: Secondary | ICD-10-CM

## 2013-06-03 DIAGNOSIS — F411 Generalized anxiety disorder: Secondary | ICD-10-CM | POA: Insufficient documentation

## 2013-06-03 DIAGNOSIS — F32A Depression, unspecified: Secondary | ICD-10-CM | POA: Diagnosis present

## 2013-06-03 DIAGNOSIS — IMO0002 Reserved for concepts with insufficient information to code with codable children: Secondary | ICD-10-CM

## 2013-06-03 LAB — RAPID URINE DRUG SCREEN, HOSP PERFORMED
AMPHETAMINES: NOT DETECTED
Barbiturates: NOT DETECTED
Benzodiazepines: POSITIVE — AB
Cocaine: POSITIVE — AB
OPIATES: NOT DETECTED
TETRAHYDROCANNABINOL: POSITIVE — AB

## 2013-06-03 LAB — SALICYLATE LEVEL: Salicylate Lvl: <2 mg/dL — ABNORMAL LOW (ref 2.8–20.0)

## 2013-06-03 LAB — COMPREHENSIVE METABOLIC PANEL
ALT: 12 U/L (ref 0–35)
AST: 17 U/L (ref 0–37)
Albumin: 4.7 g/dL (ref 3.5–5.2)
Alkaline Phosphatase: 68 U/L (ref 39–117)
BUN: 19 mg/dL (ref 6–23)
CALCIUM: 10.1 mg/dL (ref 8.4–10.5)
CO2: 25 mEq/L (ref 19–32)
CREATININE: 0.76 mg/dL (ref 0.50–1.10)
Chloride: 101 mEq/L (ref 96–112)
GFR calc Af Amer: >90 mL/min (ref >90)
GFR calc non Af Amer: >90 mL/min (ref >90)
Glucose, Bld: 83 mg/dL (ref 70–99)
Potassium: 3.7 mEq/L (ref 3.7–5.3)
SODIUM: 140 meq/L (ref 137–147)
Total Bilirubin: 0.6 mg/dL (ref 0.3–1.2)
Total Protein: 7.8 g/dL (ref 6.0–8.3)

## 2013-06-03 LAB — ETHANOL: Alcohol, Ethyl (B): <11 mg/dL (ref 0–11)

## 2013-06-03 LAB — CBC
HCT: 44.7 % (ref 36.0–46.0)
Hemoglobin: 14.9 g/dL (ref 12.0–15.0)
MCH: 27.4 pg (ref 26.0–34.0)
MCHC: 33.3 g/dL (ref 30.0–36.0)
MCV: 82.2 fL (ref 78.0–100.0)
PLATELETS: 365 10*3/uL (ref 150–400)
RBC: 5.44 MIL/uL — ABNORMAL HIGH (ref 3.87–5.11)
RDW: 12.9 % (ref 11.5–15.5)
WBC: 9.7 10*3/uL (ref 4.0–10.5)

## 2013-06-03 LAB — ACETAMINOPHEN LEVEL: Acetaminophen (Tylenol), Serum: <15 ug/mL (ref 10–30)

## 2013-06-03 NOTE — Progress Notes (Signed)
Case staffed with [name], NP. He indicates she does not meet criteria for inpatient at [facility] as she does not have active plan only SI.  May be more appropriate for inpatient that could offer treatment for cocaine abuse and help with transition to longer term substance abuse facility.  During assessment patient stated she was interested in longer term substance abuse treatment.

## 2013-06-03 NOTE — ED Notes (Signed)
Pt used telephone

## 2013-06-03 NOTE — Progress Notes (Signed)
Paperwork faxed to [facility] who has an available crisis stabilization bed and may consider her for residential women's program after stabilization. Their assessment department will call counselor back within hour. [facility] has 2 week wait period currently. Does not meet criteria for detox at [facility] or [facility].

## 2013-06-03 NOTE — Progress Notes (Signed)
Counselor left message for [facility] and [facility] regarding admission and bed availability for female bed. Will await return call and continue to pursue options.

## 2013-06-03 NOTE — Progress Notes (Signed)
Met with walk in and completed assessment.  Staffed with Randall Hiss, RN  [facility] as extender not available at this time. Patient to be transferred to [facility] for medical clearance. No beds available at this time at [facility] but would be appropriate when beds do become available. Charge nurse at [facility] advised. Appears she meets criteria for inpatient but will staff with NP/PA for final decision.  [facility] contacted for transport.
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
## 2013-06-03 NOTE — ED Notes (Signed)
Bed: WA27 Expected date:  Expected time:  Means of arrival:  Comments: TCU 
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
## 2013-06-03 NOTE — Discharge Instructions (Signed)
Finding Treatment for Alcohol and Drug Addiction It can be hard to find the right place to get professional treatment. Here are some important things to consider:  There are different types of treatment to choose from.  Some programs are live-in (residential) while others are not (outpatient). Sometimes a combination is offered.  No single type of program is right for everyone.  Most treatment programs involve a combination of education, counseling, and a 12-step, spiritually-based approach.  There are non-spiritually based programs (not 12-step).  Some treatment programs are government sponsored. They are geared for patients without private insurance.  Treatment programs can vary in many respects such as:  Cost and types of insurance accepted.  Types of on-site medical services offered.  Length of stay, setting, and size.  Overall philosophy of treatment. A person may need specialized treatment or have needs not addressed by all programs. For example, adolescents need treatment appropriate for their age. Other people have secondary disorders that must be managed as well. Secondary conditions can include mental illness, such as depression or diabetes. Often, a period of detoxification from alcohol or drugs is needed. This requires medical supervision and not all programs offer this. THINGS TO CONSIDER WHEN SELECTING A TREATMENT PROGRAM   Is the program certified by the appropriate government agency? Even private programs must be certified and employ certified professionals.  Does the program accept your insurance? If not, can a payment plan be set up?  Is the facility clean, organized, and well run? Do they allow you to speak with graduates who can share their treatment experience with you? Can you tour the facility? Can you meet with staff?  Does the program meet the full range of individual needs?  Does the treatment program address sexual orientation and physical disabilities?  Do they provide age, gender, and culturally appropriate treatment services?  Is treatment available in languages other than English?  Is long-term aftercare support or guidance encouraged and provided?  Is assessment of an individual's treatment plan ongoing to ensure it meets changing needs?  Does the program use strategies to encourage reluctant patients to remain in treatment long enough to increase the likelihood of success?  Does the program offer counseling (individual or group) and other behavioral therapies?  Does the program offer medicine as part of the treatment regimen, if needed?  Is there ongoing monitoring of possible relapse? Is there a defined relapse prevention program? Are services or referrals offered to family members to ensure they understand addiction and the recovery process? This would help them support the recovering individual.  Are 12-step meetings held at the center or is transport available for patients to attend outside meetings? In countries outside of the [place] and [place], [employer] for contact information for services in your area. Document Released: 12/21/2004 Document Revised: 04/16/2011 Document Reviewed: 07/03/2007 [facility] Patient Information 2014 [place].  Chemical Dependency Chemical dependency is an addiction to drugs or alcohol. It is characterized by the repeated behavior of seeking out and using drugs and alcohol despite harmful consequences to the health and safety of ones self and others.  RISK FACTORS There are certain situations or behaviors that increase a person's risk for chemical dependency. These include:  A family history of chemical dependency.  A history of mental health issues, including depression and anxiety.  A home environment where drugs and alcohol are easily available to you.  Drug or alcohol use at a young age. SYMPTOMS  The following symptoms can indicate chemical dependency:  Inability to limit  the use of drugs or alcohol.  Nausea, sweating, shakiness, and anxiety that occurs when alcohol or drugs are not being used.  An increase in amount of drugs or alcohol that is necessary to get drunk or high. People who experience these symptoms can assess their use of drugs and alcohol by asking themselves the following questions:  Have you been told by friends or family that they are worried about your use of alcohol or drugs?  Do friends and family ever tell you about things you did while drinking alcohol or using drugs that you do not remember?  Do you lie about using alcohol or drugs or about the amounts you use?  Do you have difficulty completing daily tasks unless you use alcohol or drugs?  Is the level of your work or school performance lower because of your drug or alcohol use?  Do you get sick from using drugs or alcohol but keep using anyway?  Do you feel uncomfortable in social situations unless you use alcohol or drugs?  Do you use drugs or alcohol to help forget problems? An answer of yes to any of these questions may indicate chemical dependency. Professional evaluation is suggested. Document Released: 01/16/2001 Document Revised: 04/16/2011 Document Reviewed: 03/30/2010 [facility] Patient Information 2014 [place], [place].  Drug Abuse and Addiction in Sports There are many types of drugs that one may become addicted to including illegal drugs (marijuana, cocaine, amphetamines, hallucinogens, and narcotics), prescription drugs (hydrocodone, codeine, and alprazolam), and other chemicals such as alcohol or nicotine. Two types of addiction exist: physical and emotional. Physical addiction usually occurs after prolonged use of a drug. However, some drugs may only take a couple uses before addiction can occur. Physical addiction is marked by withdrawal symptoms, in which the person experiences negative symptoms such as sweat, anxiety, tremors, hallucinations, or cravings in the  absence of using the drug. Emotional dependence is the psychological desire for the "high" that the drugs produce when taken. SYMPTOMS   Inattentiveness.  Negligence.  Forgetfulness.  Insomnia.  Mood swings. RISK INCREASES WITH:   Family history of addiction.  Personal history of addictive personality. Studies have shown that risktakers, which many athletes are, have a higher risk of addiction. PREVENTION The only adequate prevention of drug abuse is abstinence from drugs. TREATMENT  The first step in quitting substance abuse is recognizing the problem and realizing that one has the power to change. Quitting requires a plan and support from others. It is often necessary to seek medical assistance. Caregivers are available to offer counseling, and for certain cases, medicine to diminish the physical symptoms of withdrawal. Many organizations exist such as Alcoholics Anonymous, Narcotics Anonymous, or the [employer] on Alcoholism that offer support for individuals who have chosen to quit their habits. Document Released: 01/22/2005 Document Revised: 04/16/2011 Document Reviewed: 05/06/2008 [facility] Patient Information 2014 [place], [place].  Depression, Adult Depression is feeling sad, low, down in the dumps, blue, gloomy, or empty. In general, there are two kinds of depression:  Normal sadness or grief. This can happen after something upsetting. It often goes away on its own within 2 weeks. After losing a loved one (bereavement), normal sadness and grief may last longer than two weeks. It usually gets better with time.  Clinical depression. This kind lasts longer than normal sadness or grief. It keeps you from doing the things you normally do in life. It is often hard to function at home, work, or at school. It may affect  your relationships with others. Treatment is often needed. GET HELP RIGHT AWAY IF:  You have thoughts about hurting yourself or others.  You lose touch with  reality (psychotic symptoms). You may:  See or hear things that are not real.  Have untrue beliefs about your life or people around you.  Your medicine is giving you problems. MAKE SURE YOU:  Understand these instructions.  Will watch your condition.  Will get help right away if you are not doing well or get worse. Document Released: 02/24/2010 Document Revised: 10/17/2011 Document Reviewed: 05/24/2011 [facility] Patient Information 2014 [place], [place].  Cocaine Cocaine stimulates the central nervous system. As a stimulant, cocaine has the ability to improve athletic performance through increasing speed, endurance, and concentration, as well as decreasing fatigue. Although cocaine may seem to be beneficial for athletics, it is highly addicting and has many debilitating side effects. Cocaine has caused the deaths of many athletes, and its use is banned by every major athletic organization in the world. The clinical effect of cocaine (the high) is very short in duration. Cocaine works in the brain by altering the normal concentrations of chemicals that stimulate the brain cells.  WHY ATHLETES USE IT  Many athletes use cocaine for its central nervous system stimulating properties. It is also used as a recreational drug due to the euphoric felling it produces.  ADVERSE EFFECTS   Sleep disturbances.  Abnormal heart rhythms.  Stroke.  Heart attack.  Seizures.  Elevated blood pressure.  Death.  Paranoia (feeling that people want to hurt you).  Panic attacks (sudden feelings of anxiety or shortness of breath).  Suicidal behavior (wanting to kill yourself).  Homicidal behavior (wanting to kill other people).  Depression (feeling very sad, having decreased energy for activities).  Poor athletic performance. PHARMACOLOGY  Cocaine acts on the body for a short period of time; the clinical effects may last less than1 hour. Since most athletic competitions last for more than 1 hour,  cocaine use may not improve athletic performance. The use of cocaine makes individuals much more susceptible for serious conditions such as seizures, arrhythmia (irregular heart beat), and strokes. Even a single dose of cocaine can be detected on a drug test for up to about 30 hours.  PREVENTION Most athletes use cocaine as a recreational drug and not for the purpose of enhancing athletic performance. To prevent the use of cocaine, athletes must be educated on its side effects and the risk of addiction. If an athlete is found using cocaine, counseling and treatment are almost always required.  Document Released: 01/22/2005 Document Revised: 04/16/2011 Document Reviewed: 05/06/2008 [facility] Patient Information 2014 [place], [place].
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
## 2013-06-03 NOTE — Progress Notes (Signed)
Patient accepted to [facility] and from there may be considered for [facility].  [place], [place]  [address] [phone]  Accepting staff - Bonnye Fava, [place] shift leader, Johnnette Barrios RN

## 2013-06-03 NOTE — ED Notes (Signed)
TRANSFERRED TO [facility]

## 2013-06-03 NOTE — ED Notes (Signed)
Pt acknowledges understanding of yellow protocol sheet

## 2013-06-03 NOTE — ED Notes (Signed)
MD at bedside. TTS present to evaluate this pt
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
## 2013-06-04 NOTE — Consult Note (Signed)
Face to face evaluation and I agree with this note 

## 2013-08-27 ENCOUNTER — Emergency Department (HOSPITAL_COMMUNITY)
Admission: EM | Admit: 2013-08-27 | Discharge: 2013-08-27 | Disposition: A | Discharge status: Home / Self Care | Payer: Medicaid Other | Attending: Emergency Medicine | Admitting: Emergency Medicine

## 2013-08-27 ENCOUNTER — Encounter (HOSPITAL_COMMUNITY): Payer: Self-pay | Admitting: Emergency Medicine

## 2013-08-27 ENCOUNTER — Emergency Department (HOSPITAL_COMMUNITY): Payer: Medicaid Other

## 2013-08-27 DIAGNOSIS — Z8739 Personal history of other diseases of the musculoskeletal system and connective tissue: Secondary | ICD-10-CM | POA: Diagnosis not present

## 2013-08-27 DIAGNOSIS — S99929A Unspecified injury of unspecified foot, initial encounter: Principal | ICD-10-CM

## 2013-08-27 DIAGNOSIS — S8990XA Unspecified injury of unspecified lower leg, initial encounter: Secondary | ICD-10-CM | POA: Insufficient documentation

## 2013-08-27 DIAGNOSIS — F4310 Post-traumatic stress disorder, unspecified: Secondary | ICD-10-CM | POA: Diagnosis not present

## 2013-08-27 DIAGNOSIS — S99919A Unspecified injury of unspecified ankle, initial encounter: Principal | ICD-10-CM

## 2013-08-27 DIAGNOSIS — F17200 Nicotine dependence, unspecified, uncomplicated: Secondary | ICD-10-CM | POA: Diagnosis not present

## 2013-08-27 DIAGNOSIS — IMO0002 Reserved for concepts with insufficient information to code with codable children: Secondary | ICD-10-CM | POA: Insufficient documentation

## 2013-08-27 DIAGNOSIS — Z79899 Other long term (current) drug therapy: Secondary | ICD-10-CM | POA: Diagnosis not present

## 2013-08-27 DIAGNOSIS — F319 Bipolar disorder, unspecified: Secondary | ICD-10-CM | POA: Insufficient documentation

## 2013-08-27 DIAGNOSIS — S0990XA Unspecified injury of head, initial encounter: Secondary | ICD-10-CM | POA: Insufficient documentation

## 2013-08-27 DIAGNOSIS — M549 Dorsalgia, unspecified: Secondary | ICD-10-CM

## 2013-08-27 DIAGNOSIS — M79672 Pain in left foot: Secondary | ICD-10-CM

## 2013-08-27 MED ORDER — IBUPROFEN 800 MG PO TABS: 800.0000 mg | ORAL_TABLET | Freq: Three times a day (TID) | ORAL | Status: AC

## 2013-08-27 MED ORDER — LORAZEPAM 2 MG/ML IJ SOLN: 1.0000 mg | Freq: Once | INTRAMUSCULAR | Status: DC

## 2013-08-27 MED ORDER — LORAZEPAM 1 MG PO TABS
1.0000 mg | ORAL_TABLET | Freq: Once | ORAL | Status: AC
  Administered 2013-08-27: 1 mg via ORAL
  Filled 2013-08-27: qty 1

## 2013-08-27 NOTE — ED Notes (Signed)
Pain assessment:  Left foot 8/10  Upper posterior neck/base of skull: 8/10
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
## 2013-08-27 NOTE — Discharge Instructions (Signed)
As discussed, your evaluation today has been largely reassuring. Please use ice packs, 4 times daily, for 20 minutes each application on both your back and foot.  Please monitor your condition carefully, and do not hesitate to return here if you develop new, or concerning changes in your condition.
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
## 2013-10-29 ENCOUNTER — Encounter (HOSPITAL_COMMUNITY): Payer: Self-pay | Admitting: Emergency Medicine

## 2013-10-29 ENCOUNTER — Emergency Department (HOSPITAL_COMMUNITY)
Admission: EM | Admit: 2013-10-29 | Discharge: 2013-10-29 | Disposition: A | Discharge status: Home / Self Care | Payer: Medicaid Other | Attending: Emergency Medicine | Admitting: Emergency Medicine

## 2013-10-29 DIAGNOSIS — M129 Arthropathy, unspecified: Secondary | ICD-10-CM | POA: Diagnosis not present

## 2013-10-29 DIAGNOSIS — T07XXXA Unspecified multiple injuries, initial encounter: Secondary | ICD-10-CM | POA: Insufficient documentation

## 2013-10-29 DIAGNOSIS — S0990XA Unspecified injury of head, initial encounter: Secondary | ICD-10-CM | POA: Diagnosis present

## 2013-10-29 DIAGNOSIS — F319 Bipolar disorder, unspecified: Secondary | ICD-10-CM | POA: Diagnosis not present

## 2013-10-29 DIAGNOSIS — F4310 Post-traumatic stress disorder, unspecified: Secondary | ICD-10-CM | POA: Insufficient documentation

## 2013-10-29 DIAGNOSIS — Z975 Presence of (intrauterine) contraceptive device: Secondary | ICD-10-CM | POA: Insufficient documentation

## 2013-10-29 DIAGNOSIS — F17200 Nicotine dependence, unspecified, uncomplicated: Secondary | ICD-10-CM | POA: Diagnosis not present

## 2013-10-29 DIAGNOSIS — F909 Attention-deficit hyperactivity disorder, unspecified type: Secondary | ICD-10-CM | POA: Insufficient documentation

## 2013-10-29 DIAGNOSIS — Z79899 Other long term (current) drug therapy: Secondary | ICD-10-CM | POA: Diagnosis not present

## 2013-10-29 DIAGNOSIS — IMO0002 Reserved for concepts with insufficient information to code with codable children: Secondary | ICD-10-CM | POA: Insufficient documentation

## 2013-10-29 DIAGNOSIS — S060X9A Concussion with loss of consciousness of unspecified duration, initial encounter: Secondary | ICD-10-CM | POA: Insufficient documentation

## 2013-10-29 DIAGNOSIS — R55 Syncope and collapse: Secondary | ICD-10-CM | POA: Diagnosis not present
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
## 2013-10-29 NOTE — Discharge Instructions (Signed)
Use ice on the sore areas 3-4 times a day.  Take Tylenol or Motrin for pain. Return here if needed for problems.   Contusion A contusion is a deep bruise. Contusions are the result of an injury that caused bleeding under the skin. The contusion may turn blue, purple, or yellow. Minor injuries will give you a painless contusion, but more severe contusions may stay painful and swollen for a few weeks.  CAUSES  A contusion is usually caused by a blow, trauma, or direct force to an area of the body. SYMPTOMS   Swelling and redness of the injured area.  Bruising of the injured area.  Tenderness and soreness of the injured area.  Pain. DIAGNOSIS  The diagnosis can be made by taking a history and physical exam. An X-ray, CT scan, or MRI may be needed to determine if there were any associated injuries, such as fractures. TREATMENT  Specific treatment will depend on what area of the body was injured. In general, the best treatment for a contusion is resting, icing, elevating, and applying cold compresses to the injured area. Over-the-counter medicines may also be recommended for pain control. Ask your caregiver what the best treatment is for your contusion. HOME CARE INSTRUCTIONS   Put ice on the injured area.  Put ice in a plastic bag.  Place a towel between your skin and the bag.  Leave the ice on for 15-20 minutes, 3-4 times a day, or as directed by your health care provider.  Only take over-the-counter or prescription medicines for pain, discomfort, or fever as directed by your caregiver. Your caregiver may recommend avoiding anti-inflammatory medicines (aspirin, ibuprofen, and naproxen) for 48 hours because these medicines may increase bruising.  Rest the injured area.  If possible, elevate the injured area to reduce swelling. SEEK IMMEDIATE MEDICAL CARE IF:   You have increased bruising or swelling.  You have pain that is getting worse.  Your swelling or pain is not relieved  with medicines. MAKE SURE YOU:   Understand these instructions.  Will watch your condition.  Will get help right away if you are not doing well or get worse. Document Released: 11/01/2004 Document Revised: 01/27/2013 Document Reviewed: 11/27/2010 [facility] Patient Information 2015 [place], [place]. This information is not intended to replace advice given to you by your health care provider. Make sure you discuss any questions you have with your health care provider.  Head Injury You have received a head injury. It does not appear serious at this time. Headaches and vomiting are common following head injury. It should be easy to awaken from sleeping. Sometimes it is necessary for you to stay in the emergency department for a while for observation. Sometimes admission to the hospital may be needed. After injuries such as yours, most problems occur within the first 24 hours, but side effects may occur up to 7-10 days after the injury. It is important for you to carefully monitor your condition and contact your health care provider or seek immediate medical care if there is a change in your condition. WHAT ARE THE TYPES OF HEAD INJURIES? Head injuries can be as minor as a bump. Some head injuries can be more severe. More severe head injuries include:  A jarring injury to the brain (concussion).  A bruise of the brain (contusion). This mean there is bleeding in the brain that can cause swelling.  A cracked skull (skull fracture).  Bleeding in the brain that collects, clots, and forms a bump (hematoma). WHAT  CAUSES A HEAD INJURY? A serious head injury is most likely to happen to someone who is in a car wreck and is not wearing a seat belt. Other causes of major head injuries include bicycle or motorcycle accidents, sports injuries, and falls. HOW ARE HEAD INJURIES DIAGNOSED? A complete history of the event leading to the injury and your current symptoms will be helpful in diagnosing head injuries.  Many times, pictures of the brain, such as CT or MRI are needed to see the extent of the injury. Often, an overnight hospital stay is necessary for observation.  WHEN SHOULD I SEEK IMMEDIATE MEDICAL CARE?  You should get help right away if:  You have confusion or drowsiness.  You feel sick to your stomach (nauseous) or have continued, forceful vomiting.  You have dizziness or unsteadiness that is getting worse.  You have severe, continued headaches not relieved by medicine. Only take over-the-counter or prescription medicines for pain, fever, or discomfort as directed by your health care provider.  You do not have normal function of the arms or legs or are unable to walk.  You notice changes in the black spots in the center of the colored part of your eye (pupil).  You have a clear or bloody fluid coming from your nose or ears.  You have a loss of vision. During the next 24 hours after the injury, you must stay with someone who can watch you for the warning signs. This person should contact local emergency services (911 in the [place] if you have seizures, you become unconscious, or you are unable to wake up. HOW CAN I PREVENT A HEAD INJURY IN THE FUTURE? The most important factor for preventing major head injuries is avoiding motor vehicle accidents. To minimize the potential for damage to your head, it is crucial to wear seat belts while riding in motor vehicles. Wearing helmets while bike riding and playing collision sports (like football) is also helpful. Also, avoiding dangerous activities around the house will further help reduce your risk of head injury.  WHEN CAN I RETURN TO NORMAL ACTIVITIES AND ATHLETICS? You should be reevaluated by your health care provider before returning to these activities. If you have any of the following symptoms, you should not return to activities or contact sports until 1 week after the symptoms have stopped:  Persistent headache.  Dizziness or  vertigo.  Poor attention and concentration.  Confusion.  Memory problems.  Nausea or vomiting.  Fatigue or tire easily.  Irritability.  Intolerant of bright lights or loud noises.  Anxiety or depression.  Disturbed sleep. MAKE SURE YOU:   Understand these instructions.  Will watch your condition.  Will get help right away if you are not doing well or get worse. Document Released: 01/22/2005 Document Revised: 01/27/2013 Document Reviewed: 09/29/2012 [facility] Patient Information 2015 [place], [place]. This information is not intended to replace advice given to you by your health care provider. Make sure you discuss any questions you have with your health care provider.

## 2014-06-15 ENCOUNTER — Emergency Department (HOSPITAL_COMMUNITY)
Admission: EM | Admit: 2014-06-15 | Discharge: 2014-06-16 | Disposition: A | Discharge status: Other Acute Inpatient Hospital | Payer: MEDICAID | Attending: Emergency Medicine | Admitting: Emergency Medicine

## 2014-06-15 ENCOUNTER — Encounter (HOSPITAL_COMMUNITY): Payer: Self-pay

## 2014-06-15 DIAGNOSIS — F329 Major depressive disorder, single episode, unspecified: Secondary | ICD-10-CM | POA: Insufficient documentation

## 2014-06-15 DIAGNOSIS — F1410 Cocaine abuse, uncomplicated: Secondary | ICD-10-CM

## 2014-06-15 DIAGNOSIS — F1210 Cannabis abuse, uncomplicated: Secondary | ICD-10-CM | POA: Diagnosis not present

## 2014-06-15 DIAGNOSIS — Z3202 Encounter for pregnancy test, result negative: Secondary | ICD-10-CM | POA: Diagnosis not present

## 2014-06-15 DIAGNOSIS — F419 Anxiety disorder, unspecified: Secondary | ICD-10-CM | POA: Diagnosis not present

## 2014-06-15 DIAGNOSIS — F1310 Sedative, hypnotic or anxiolytic abuse, uncomplicated: Secondary | ICD-10-CM | POA: Diagnosis not present

## 2014-06-15 DIAGNOSIS — F1110 Opioid abuse, uncomplicated: Secondary | ICD-10-CM | POA: Insufficient documentation

## 2014-06-15 DIAGNOSIS — F32A Depression, unspecified: Secondary | ICD-10-CM

## 2014-06-15 DIAGNOSIS — Z720 Tobacco use: Secondary | ICD-10-CM | POA: Diagnosis not present

## 2014-06-15 DIAGNOSIS — Z008 Encounter for other general examination: Secondary | ICD-10-CM | POA: Diagnosis present

## 2014-06-15 DIAGNOSIS — R45851 Suicidal ideations: Secondary | ICD-10-CM

## 2014-06-15 LAB — CBC WITH DIFFERENTIAL/PLATELET
BASOS PCT: 0 % (ref 0–1)
Basophils Absolute: 0 10*3/uL (ref 0.0–0.1)
EOS PCT: 1 % (ref 0–5)
Eosinophils Absolute: 0.1 10*3/uL (ref 0.0–0.7)
HEMATOCRIT: 39.9 % (ref 36.0–46.0)
Hemoglobin: 13.1 g/dL (ref 12.0–15.0)
Lymphocytes Relative: 15 % (ref 12–46)
Lymphs Abs: 1.4 10*3/uL (ref 0.7–4.0)
MCH: 26.7 pg (ref 26.0–34.0)
MCHC: 32.8 g/dL (ref 30.0–36.0)
MCV: 81.3 fL (ref 78.0–100.0)
MONO ABS: 0.9 10*3/uL (ref 0.1–1.0)
Monocytes Relative: 10 % (ref 3–12)
Neutro Abs: 7.1 10*3/uL (ref 1.7–7.7)
Neutrophils Relative %: 74 % (ref 43–77)
Platelets: 299 10*3/uL (ref 150–400)
RBC: 4.91 MIL/uL (ref 3.87–5.11)
RDW: 13.1 % (ref 11.5–15.5)
WBC: 9.6 10*3/uL (ref 4.0–10.5)

## 2014-06-15 LAB — URINALYSIS, ROUTINE W REFLEX MICROSCOPIC
BILIRUBIN URINE: NEGATIVE
GLUCOSE, UA: NEGATIVE mg/dL
HGB URINE DIPSTICK: NEGATIVE
Ketones, ur: NEGATIVE mg/dL
Leukocytes, UA: NEGATIVE
Nitrite: POSITIVE — AB
PH: 6.5 (ref 5.0–8.0)
PROTEIN: NEGATIVE mg/dL
Specific Gravity, Urine: 1.015 (ref 1.005–1.030)
Urobilinogen, UA: 0.2 mg/dL (ref 0.0–1.0)

## 2014-06-15 LAB — BASIC METABOLIC PANEL
ANION GAP: 6 (ref 5–15)
BUN: 16 mg/dL (ref 6–20)
CO2: 26 mmol/L (ref 22–32)
CREATININE: 0.77 mg/dL (ref 0.44–1.00)
Calcium: 9.2 mg/dL (ref 8.9–10.3)
Chloride: 106 mmol/L (ref 101–111)
GFR calc non Af Amer: >60 mL/min (ref >60)
Glucose, Bld: 99 mg/dL (ref 70–99)
Potassium: 3.5 mmol/L (ref 3.5–5.1)
Sodium: 138 mmol/L (ref 135–145)

## 2014-06-15 LAB — RAPID URINE DRUG SCREEN, HOSP PERFORMED
AMPHETAMINES: NOT DETECTED
Barbiturates: NOT DETECTED
Benzodiazepines: POSITIVE — AB
COCAINE: POSITIVE — AB
OPIATES: POSITIVE — AB
TETRAHYDROCANNABINOL: POSITIVE — AB

## 2014-06-15 LAB — URINE MICROSCOPIC-ADD ON

## 2014-06-15 LAB — ETHANOL: Alcohol, Ethyl (B): <5 mg/dL (ref <5)

## 2014-06-15 LAB — PREGNANCY, URINE: Preg Test, Ur: NEGATIVE

## 2014-06-15 MED ORDER — IBUPROFEN 400 MG PO TABS: 600.0000 mg | ORAL_TABLET | Freq: Three times a day (TID) | ORAL | Status: DC | PRN

## 2014-06-15 MED ORDER — LORAZEPAM 1 MG PO TABS
1.0000 mg | ORAL_TABLET | Freq: Once | ORAL | Status: AC
  Administered 2014-06-15: 1 mg via ORAL
  Filled 2014-06-15: qty 1

## 2014-06-15 MED ORDER — NICOTINE 21 MG/24HR TD PT24: 21.0000 mg | MEDICATED_PATCH | Freq: Every day | TRANSDERMAL | Status: DC | PRN

## 2014-06-15 MED ORDER — ALUM & MAG HYDROXIDE-SIMETH 200-200-20 MG/5ML PO SUSP: 30.0000 mL | ORAL | Status: DC | PRN

## 2014-06-15 MED ORDER — ACETAMINOPHEN 325 MG PO TABS: 650.0000 mg | ORAL_TABLET | ORAL | Status: DC | PRN

## 2014-06-15 MED ORDER — ONDANSETRON HCL 4 MG PO TABS: 4.0000 mg | ORAL_TABLET | Freq: Three times a day (TID) | ORAL | Status: DC | PRN

## 2014-06-15 MED ORDER — ZOLPIDEM TARTRATE 5 MG PO TABS: 5.0000 mg | ORAL_TABLET | Freq: Every evening | ORAL | Status: DC | PRN

## 2014-06-15 MED ORDER — LORAZEPAM 1 MG PO TABS: 1.0000 mg | ORAL_TABLET | Freq: Four times a day (QID) | ORAL | Status: DC | PRN
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
## 2014-06-15 NOTE — B[facility] Counselor (Signed)
Per Patriciaann Clan, NP - patient meets criteria for inpatient hospitalization.  Writer informed the nurse of the patients disposition.  TTS will seek placement.
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
## 2014-06-15 NOTE — Progress Notes (Addendum)
Per [facility] Tori patient accepted at [facility] to Dr. Parke Poisson, bed 401, patient can come after 12:30am. Call report [phone]. Writer informed pt's nurse RN Shirlean Mylar.  Verlon Setting, [place] Disposition staff 06/15/2014 10:35 PM

## 2014-06-15 NOTE — ED Notes (Signed)
Telepsych placed in patients room at this time.

## 2014-06-15 NOTE — ED Notes (Signed)
Nausea x 2 days with emesis. Pt reports using marijuana today to help with nausea and feels like " the weed was laced. I feel like I could jump out of my skin. I have never felt like this before after smoking it.I feel tightness in my chest"
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
## 2014-06-15 NOTE — Progress Notes (Signed)
Patient has been faxed for IP treatment at: [facility], [facility], [facility], [place], [place], [place], and [facility].  [place] - per Joycelyn Schmid, d/cs tomorrow, fax referral. [facility] - per [place], fax referral, beds for everyone. [facility] - per [place], 2 adult beds open. [facility] - per Juliann Pulse, fax referral. [facility] - went to [facility]'s voicemail. [place] - voicemail. [place] - per [facility], fax referrral for waitlist. [facility] - busy tone   At capacity: [place] per [place] per [place] per [facility] - per [facility] - per [facility] - per [facility] - per [facility] - per [place]  CSW will continue to seek placement.  Verlon Setting, [place] Disposition staff 06/15/2014 8:41 PM
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
## 2014-06-16 ENCOUNTER — Inpatient Hospital Stay (HOSPITAL_COMMUNITY)
Admission: AD | Admit: 2014-06-16 | Discharge: 2014-06-22 | DRG: 882 | Disposition: A | Discharge status: Home / Self Care | Payer: MEDICAID | Source: Intra-hospital | Attending: Psychiatry | Admitting: Psychiatry

## 2014-06-16 ENCOUNTER — Encounter (HOSPITAL_COMMUNITY): Payer: Self-pay | Admitting: *Deleted

## 2014-06-16 DIAGNOSIS — F4310 Post-traumatic stress disorder, unspecified: Secondary | ICD-10-CM | POA: Diagnosis present

## 2014-06-16 DIAGNOSIS — F3160 Bipolar disorder, current episode mixed, unspecified: Secondary | ICD-10-CM | POA: Diagnosis present

## 2014-06-16 DIAGNOSIS — F17210 Nicotine dependence, cigarettes, uncomplicated: Secondary | ICD-10-CM | POA: Diagnosis present

## 2014-06-16 DIAGNOSIS — F1410 Cocaine abuse, uncomplicated: Secondary | ICD-10-CM | POA: Diagnosis present

## 2014-06-16 DIAGNOSIS — F1210 Cannabis abuse, uncomplicated: Secondary | ICD-10-CM | POA: Diagnosis present

## 2014-06-16 DIAGNOSIS — G4700 Insomnia, unspecified: Secondary | ICD-10-CM | POA: Diagnosis present

## 2014-06-16 DIAGNOSIS — F1994 Other psychoactive substance use, unspecified with psychoactive substance-induced mood disorder: Secondary | ICD-10-CM | POA: Diagnosis not present

## 2014-06-16 DIAGNOSIS — F319 Bipolar disorder, unspecified: Secondary | ICD-10-CM | POA: Diagnosis present

## 2014-06-16 DIAGNOSIS — R45851 Suicidal ideations: Secondary | ICD-10-CM | POA: Diagnosis present

## 2014-06-16 DIAGNOSIS — F909 Attention-deficit hyperactivity disorder, unspecified type: Secondary | ICD-10-CM | POA: Diagnosis present

## 2014-06-16 DIAGNOSIS — F3130 Bipolar disorder, current episode depressed, mild or moderate severity, unspecified: Secondary | ICD-10-CM | POA: Insufficient documentation

## 2014-06-16 DIAGNOSIS — N760 Acute vaginitis: Secondary | ICD-10-CM

## 2014-06-16 DIAGNOSIS — B9689 Other specified bacterial agents as the cause of diseases classified elsewhere: Secondary | ICD-10-CM | POA: Insufficient documentation

## 2014-06-16 DIAGNOSIS — F410 Panic disorder [episodic paroxysmal anxiety] without agoraphobia: Secondary | ICD-10-CM | POA: Diagnosis present

## 2014-06-16 DIAGNOSIS — Z801 Family history of malignant neoplasm of trachea, bronchus and lung: Secondary | ICD-10-CM

## 2014-06-16 DIAGNOSIS — G40909 Epilepsy, unspecified, not intractable, without status epilepticus: Secondary | ICD-10-CM

## 2014-06-16 MED ORDER — LAMOTRIGINE 25 MG PO TABS
25.0000 mg | ORAL_TABLET | Freq: Every day | ORAL | Status: DC
  Administered 2014-06-16 – 2014-06-21 (×6): 25 mg via ORAL
  Filled 2014-06-16 (×9): qty 1

## 2014-06-16 MED ORDER — NICOTINE POLACRILEX 2 MG MT GUM
2.0000 mg | CHEWING_GUM | OROMUCOSAL | Status: DC | PRN
  Administered 2014-06-17 – 2014-06-22 (×11): 2 mg via ORAL
  Filled 2014-06-16 (×6): qty 1

## 2014-06-16 MED ORDER — HYDROXYZINE HCL 25 MG PO TABS
25.0000 mg | ORAL_TABLET | Freq: Four times a day (QID) | ORAL | Status: DC | PRN
  Administered 2014-06-16 – 2014-06-17 (×4): 25 mg via ORAL
  Filled 2014-06-16 (×4): qty 1

## 2014-06-16 MED ORDER — NICOTINE POLACRILEX 2 MG MT GUM
CHEWING_GUM | OROMUCOSAL | Status: AC
  Administered 2014-06-16: 2 mg
  Filled 2014-06-16: qty 1

## 2014-06-16 MED ORDER — ZIPRASIDONE HCL 20 MG PO CAPS
20.0000 mg | ORAL_CAPSULE | Freq: Every day | ORAL | Status: DC
Start: 2014-06-16 — End: 2014-06-18
  Administered 2014-06-16 – 2014-06-17 (×2): 20 mg via ORAL
  Filled 2014-06-16 (×3): qty 1

## 2014-06-16 MED ORDER — ACETAMINOPHEN 325 MG PO TABS: 650.0000 mg | ORAL_TABLET | Freq: Four times a day (QID) | ORAL | Status: DC | PRN

## 2014-06-16 MED ORDER — CITALOPRAM HYDROBROMIDE 20 MG PO TABS
20.0000 mg | ORAL_TABLET | Freq: Every day | ORAL | Status: DC
  Administered 2014-06-16 – 2014-06-22 (×7): 20 mg via ORAL
  Filled 2014-06-16 (×2): qty 1
  Filled 2014-06-16: qty 14
  Filled 2014-06-16 (×8): qty 1

## 2014-06-16 MED ORDER — IBUPROFEN 600 MG PO TABS
ORAL_TABLET | ORAL | Status: AC
  Administered 2014-06-16: 600 mg
  Filled 2014-06-16: qty 1

## 2014-06-16 MED ORDER — ALUM & MAG HYDROXIDE-SIMETH 200-200-20 MG/5ML PO SUSP
30.0000 mL | ORAL | Status: DC | PRN
Start: 2014-06-16 — End: 2014-06-22

## 2014-06-16 MED ORDER — IBUPROFEN 600 MG PO TABS: 600.0000 mg | ORAL_TABLET | Freq: Four times a day (QID) | ORAL | Status: DC | PRN

## 2014-06-16 MED ORDER — HYDROXYZINE HCL 25 MG PO TABS
ORAL_TABLET | ORAL | Status: AC
  Administered 2014-06-16: 25 mg
  Filled 2014-06-16: qty 1

## 2014-06-16 MED ORDER — TRAZODONE HCL 50 MG PO TABS
50.0000 mg | ORAL_TABLET | Freq: Every evening | ORAL | Status: DC | PRN
  Administered 2014-06-16 – 2014-06-19 (×7): 50 mg via ORAL
  Filled 2014-06-16 (×12): qty 1

## 2014-06-16 MED ORDER — MAGNESIUM HYDROXIDE 400 MG/5ML PO SUSP
30.0000 mL | Freq: Every day | ORAL | Status: DC | PRN
  Administered 2014-06-18: 30 mL via ORAL
  Filled 2014-06-16: qty 30

## 2014-06-16 NOTE — ED Notes (Signed)
Transported via [facility] with all belongings and paperwork.

## 2014-06-16 NOTE — BHH Group Notes (Signed)
[facility] LCSW Aftercare Discharge Planning Group Note   06/16/2014 12:57 PM  Participation Quality:  Did not attend group.  Erisa Mehlman, [place]

## 2014-06-16 NOTE — Progress Notes (Signed)
D: Patient is alert and oriented. Pt's mood and affect is depressed and blunted. Pt is drowsy this morning during RN assessment d/t early admission to [facility]. Pt denies SI/HI and AVH. Pt reports her concern for the day is getting her "panic" and anxiety under control. Pt complains of anxiety today which decreased with PRN medication. A: Encouragement/Support provided to pt. Active listening by RN. EKG completed per providers orders and placed on pt's chart. Medication education reviewed with pt. PRN medication administered for anxiety per providers orders (See MAR). Scheduled medications administered per providers orders (See MAR). 15 minute checks continued per protocol for patient safety.  R: Patient cooperative and receptive to nursing interventions. Pt remains safe.

## 2014-06-16 NOTE — Progress Notes (Signed)
Recreation Therapy Notes   Date: 06/16/14 Time: 9:30am Location: [address] Group Room  Group Topic: Stress Management  Goal Area(s) Addresses:  Patient will actively participate in stress management techniques presented during session.   Behavioral Response:  Did not attend.    [name], LRT/CTRS        [name] A 06/16/2014 3:15 PM

## 2014-06-16 NOTE — Tx Team (Signed)
Initial Interdisciplinary Treatment Plan   PATIENT STRESSORS: Marital or family conflict Medication change or noncompliance   PATIENT STRENGTHS: Ability for insight Active sense of humor Average or above average intelligence Capable of independent living General fund of knowledge Motivation for treatment/growth   PROBLEM LIST: Problem List/Patient Goals Date to be addressed Date deferred Reason deferred Estimated date of resolution  "self independence" 06/16/14     "peace of mind" 06/16/14     depression 06/16/14     Suicidal ideation 06/16/14                                    DISCHARGE CRITERIA:  Ability to meet basic life and health needs Improved stabilization in mood, thinking, and/or behavior Verbal commitment to aftercare and medication compliance  PRELIMINARY DISCHARGE PLAN: Attend aftercare/continuing care group Placement in alternative living arrangements  PATIENT/FAMIILY INVOLVEMENT: This treatment plan has been presented to and reviewed with the patient, [name], and/or family member, ".  The patient and family have been given the opportunity to ask questions and make suggestions.  [place], [place] 06/16/2014, 2:57 AM

## 2014-06-16 NOTE — BHH Group Notes (Signed)
[facility] LCSW Group Therapy  06/16/2014 2:47 PM  Type of Therapy:  Group Therapy  Participation Level:  Did Not Attend  [name] 06/16/2014, 2:47 PM

## 2014-06-16 NOTE — Plan of Care (Signed)
Problem: Ineffective individual coping Goal: STG: Patient will remain free from self harm Outcome: Progressing Patient remains free from self harm. 15 minute checks continued per protocol for patient safety.   Problem: Alteration in mood Goal: STG-Patient reports thoughts of self-harm to staff Outcome: Progressing Patient agrees to report thoughts of self harm to RN.
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
## 2014-06-16 NOTE — Progress Notes (Signed)
Pt did not attend group this evening.  

## 2014-06-17 LAB — TSH: TSH: 1.414 u[IU]/mL (ref 0.350–4.500)

## 2014-06-17 MED ORDER — HYDROXYZINE HCL 50 MG PO TABS
50.0000 mg | ORAL_TABLET | Freq: Three times a day (TID) | ORAL | Status: DC | PRN
  Administered 2014-06-17 – 2014-06-22 (×10): 50 mg via ORAL
  Filled 2014-06-17: qty 1
  Filled 2014-06-17: qty 20
  Filled 2014-06-17 (×10): qty 1
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
## 2014-06-17 NOTE — [facility] Group Notes (Signed)
[facility]  [facility] 1:15 - 2:30 PM  06/17/2014 4:11 PM   Type of Therapy:  Group Therapy  Participation Level: Active  Participation Quality:  Attentive  Affect:  Appropriate  Cognitive:  Appropriate  Insight:  Developing/Improving   Engagement in Therapy:  Developing/Improving   Modes of Intervention:  Discussion, Education, Exploration, Problem-Solving, Rapport Building, Support   Summary of Progress/Problems:   Patient was attentive to speaker from the [facility] as he shared his story of dealing with mental health/substance abuse issues and overcoming it by working a recovery program.  Patient asked questions of the speaker and expressed interest in their programs and services and received information on their agency.    [name] 06/17/2014 4:11 PM

## 2014-06-17 NOTE — Progress Notes (Signed)
D: Pt presents with flat affect and depressed mood. Pt rates depression 6/10. Pt reported feeling anxious this morning and requested vistaril 0820. Pt verbalized that vistaril is effective. Pt denies suicidal thoughts at this time. Pt would that she may be going through a divorce with her husband. Pt has minimal interaction on the unit and spends most of her time in her room in bed. However, pt is compliant with attending groups. Pt compliant with taking meds and denies any side effects to meds at this time. Pt appears disheveled and remain in scrubs.  A: Medications administered as ordered per MD. Verbal support given. Pt encouraged to attend groups. 15 minute checks performed for safety.  R: Pt receptive to treatment. Marland Kitchen
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
## 2014-06-17 NOTE — Progress Notes (Signed)
D Pt. Denies SI and HI, no complaints of pain or discomfort noted at present time.  A Writer offered support and encouragement, discussed need for pt. To interact in the milieu.  R Pt. Rates her anxiety and depression a 5 today.  States she had not slept in days and will hopefully feel better tomorrow and will attend groups. Pt. Remains safe on the unit.

## 2014-06-17 NOTE — [facility] Suicide Risk Assessment (Signed)
[place] INPATIENT:  Family/Significant Other Suicide Prevention Education  Suicide Prevention Education:  Patient Refusal for Family/Significant Other Suicide Prevention Education: The patient [name] has refused to provide written consent for family/significant other to be provided Family/Significant Other Suicide Prevention Education during admission and/or prior to discharge.  Physician notified.  Concha Pyo 06/17/2014, 4:20 PM

## 2014-06-18 DIAGNOSIS — F3160 Bipolar disorder, current episode mixed, unspecified: Secondary | ICD-10-CM | POA: Diagnosis present

## 2014-06-18 DIAGNOSIS — G40909 Epilepsy, unspecified, not intractable, without status epilepticus: Secondary | ICD-10-CM

## 2014-06-18 DIAGNOSIS — F1210 Cannabis abuse, uncomplicated: Secondary | ICD-10-CM | POA: Diagnosis present

## 2014-06-18 DIAGNOSIS — F1410 Cocaine abuse, uncomplicated: Secondary | ICD-10-CM | POA: Diagnosis present

## 2014-06-18 DIAGNOSIS — F319 Bipolar disorder, unspecified: Secondary | ICD-10-CM | POA: Diagnosis present

## 2014-06-18 DIAGNOSIS — F4310 Post-traumatic stress disorder, unspecified: Principal | ICD-10-CM

## 2014-06-18 LAB — URINALYSIS W MICROSCOPIC (NOT AT ARMC)
BILIRUBIN URINE: NEGATIVE
Glucose, UA: NEGATIVE mg/dL
Hgb urine dipstick: NEGATIVE
Ketones, ur: NEGATIVE mg/dL
Leukocytes, UA: NEGATIVE
Nitrite: NEGATIVE
PROTEIN: NEGATIVE mg/dL
SPECIFIC GRAVITY, URINE: 1.004 — AB (ref 1.005–1.030)
Urobilinogen, UA: 0.2 mg/dL (ref 0.0–1.0)
pH: 7.5 (ref 5.0–8.0)

## 2014-06-18 MED ORDER — LORAZEPAM 2 MG/ML IJ SOLN: 1.0000 mg | INTRAMUSCULAR | Status: DC | PRN

## 2014-06-18 MED ORDER — ZIPRASIDONE HCL 40 MG PO CAPS
40.0000 mg | ORAL_CAPSULE | Freq: Every day | ORAL | Status: DC
  Administered 2014-06-18 – 2014-06-21 (×4): 40 mg via ORAL
  Filled 2014-06-18: qty 1
  Filled 2014-06-18: qty 14
  Filled 2014-06-18 (×4): qty 1

## 2014-06-18 MED ORDER — LORAZEPAM 1 MG PO TABS
1.0000 mg | ORAL_TABLET | ORAL | Status: DC | PRN
  Administered 2014-06-19 – 2014-06-20 (×2): 1 mg via ORAL
  Filled 2014-06-18 (×2): qty 1
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
## 2014-06-18 NOTE — Progress Notes (Signed)
Recreation Therapy Notes  Date: 06/17/14 Time: 2:30pm Location: [address]   AAA/T Program Assumption of Risk Form signed by Patient/ or Parent Legal Guardian YES   Patient is free of allergies or sever asthma YES   Patient reports no fear of animals YES  Patient reports no history of cruelty to animals YES  Patient understands his/her participation is voluntary YES  Patient washes hands before animal contact YES  Patient washes hands after animal contact YES  Behavioral Response: Appropriate, attentive  Education:Hand Washing, Appropriate Animal Interaction   Education Outcome: Acknowledges understanding/In group clarification offered   Clinical Observations/Feedback: Patient sat on the floor and pet the dog, was attentive to speaker, and asked questions about the breed, the difference between a service dog and therapy dog as well as questions about how the dog is trained.   [name], LRT/CTRS          Ria Comment, Sadae Arrazola A 06/18/2014 8:27 AM
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
## 2014-06-18 NOTE — Progress Notes (Signed)
[facility] Group Notes:  (Nursing/MHT/Case Management/Adjunct)  Date:  06/18/2014  Time:  10:29 PM    Type of Therapy:  Psychoeducational Skills  Participation Level:  Active  Participation Quality:  Sharing  Affect:  Appropriate  Cognitive:  Appropriate  Insight:  Appropriate  Engagement in Group:  Engaged  Modes of Intervention:  Discussion  Summary of Progress/Problems: In tonight's Wrap up group [name] stated that her day was an overall 7 and she was working on a support system once she leaves the hospital. She stated that her current environment and friends aren't good for her and she realizes this so she is looking into finding healthier outlets.   [name] 06/18/2014, 10:29 PM

## 2014-06-18 NOTE — Progress Notes (Signed)
Recreation Therapy Notes  Date: 06/18/14 Time: 9:30am Location: [address]  Group Topic: Stress Management  Goal Area(s) Addresses:  Patient will actively participate in stress management techniques presented during session.   Intervention: Stress management techniques  Activity: Guided Imagery. LRT provided instruction and demonstration for Guided Imagery.   Education: Stress Management, Discharge Planning.   Clinical Observations/Feedback: Patient did not attend group.   [name], LRT/CTRS         [name] A 06/18/2014 3:44 PM

## 2014-06-18 NOTE — Progress Notes (Signed)
D: Patient continues to be anxious and depressed.  She rates her depression as a 7; hopelessness as a 6; anxiety as a 9.  She is sleeping fair stating it takes her a long time to fall asleep.  She reports minimal withdrawal symptoms.  She is experiencing some irritability and agitation.  She has passive SI and contracts for safety on the unit.  She denies HI/AVH.  Her goal today is to "work on lowering my anxiety and find a place to go."  She is interacting well with others and is attending groups. A: Continue to monitor medication management and MD orders.  Safety checks completed every 15 minutes per protocol.  Meet 1;1 with patient to discuss concerns and offer encouragement. R: Patient's behavior is appropriate to situation.

## 2014-06-18 NOTE — Progress Notes (Addendum)
D: Patient still complaining of anxiety and depression. She states that she is not sure of her situation once she gets out of here as far as where she will live and she stated that she may actively be going through a divorce. Patient stated that her day was 6/10. Patient gave 5 coping strategies that she will use once she leaves out of here. She stated that her goal was lowering her anxiety and finding a place to live. She denies SI/HI/ AVH.   A: Support and encouragement offered to patient. Patient attended evening group therapy. Safety checks performed on patient q 15 min.   R: Patient response appropiate to situation, patient is anxious and would like to have a better day tomorrow.
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
## 2014-06-19 ENCOUNTER — Encounter (HOSPITAL_COMMUNITY): Payer: Self-pay | Admitting: Registered Nurse

## 2014-06-19 DIAGNOSIS — B9689 Other specified bacterial agents as the cause of diseases classified elsewhere: Secondary | ICD-10-CM | POA: Insufficient documentation

## 2014-06-19 DIAGNOSIS — F3160 Bipolar disorder, current episode mixed, unspecified: Secondary | ICD-10-CM

## 2014-06-19 DIAGNOSIS — N760 Acute vaginitis: Secondary | ICD-10-CM | POA: Insufficient documentation

## 2014-06-19 MED ORDER — METRONIDAZOLE 500 MG PO TABS
500.0000 mg | ORAL_TABLET | Freq: Two times a day (BID) | ORAL | Status: DC
  Administered 2014-06-19 – 2014-06-22 (×7): 500 mg via ORAL
  Filled 2014-06-19: qty 8
  Filled 2014-06-19 (×2): qty 1
  Filled 2014-06-19: qty 8
  Filled 2014-06-19: qty 2
  Filled 2014-06-19 (×7): qty 1
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
## 2014-06-19 NOTE — Progress Notes (Signed)
Patient is having abdominal cramps and constipation this am.  Milk of Mag did not give any relief this am. Offered to try something else for patient. She declined, only wanted to have tray and drink sent to her room.

## 2014-06-19 NOTE — Progress Notes (Signed)
D: Patient continues to express depressive symptoms.  She did not get out of bed until late morning.  She rates her depression as a 7; her hopelessness as an 8; her anxiety as a 10.  Patient presents with anxious, depressed mood.  She appears nervous and fidgety.  Her main complaint today has been lower abdomen pain, possibly due to a UTI.  She will be started on flagyl today.  She denies SI/HI/AVH.  Patient's goal today is to "lower anxiety and figure out why my stomach hurts. A: Continue to monitor medication management and MD orders.  Safety checks completed every 15 minutes per protocol.  Meet 1;1 with patient to discuss concerns and offer encouragement. R: Patient's behavior is appropriate to situation.

## 2014-06-19 NOTE — Progress Notes (Signed)
D    Pt is pleasant and cooperative    She is somewhat med seeking and asks for medications frequently   She interacts well with others and attends all unit activities A   Verbal support given   Medications administered and effectiveness monitored   Q 15 min checks R   Pt safe at present

## 2014-06-20 DIAGNOSIS — G4700 Insomnia, unspecified: Secondary | ICD-10-CM | POA: Insufficient documentation

## 2014-06-20 LAB — URINE CULTURE
CULTURE: NO GROWTH
Colony Count: NO GROWTH
Special Requests: NORMAL

## 2014-06-20 MED ORDER — QUETIAPINE FUMARATE 50 MG PO TABS
50.0000 mg | ORAL_TABLET | Freq: Every day | ORAL | Status: DC
  Administered 2014-06-20: 50 mg via ORAL
  Filled 2014-06-20 (×3): qty 1

## 2014-06-20 MED ORDER — TRAZODONE HCL 50 MG PO TABS
50.0000 mg | ORAL_TABLET | Freq: Every evening | ORAL | Status: DC | PRN
  Administered 2014-06-20 – 2014-06-21 (×2): 50 mg via ORAL
  Filled 2014-06-20: qty 14
  Filled 2014-06-20: qty 1

## 2014-06-20 NOTE — Progress Notes (Signed)
[facility] 0930  The focus of this group is to educate the patient on the purpose and policies of crisis stabilization and provide a format to answer questions about their admission.  The group details unit policies and expectations of patients while admitted.  During group, patient reports being able to identify one positive support system in her life.
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
## 2014-06-20 NOTE — Progress Notes (Signed)
Adult Psychoeducational Group Note  Date:  06/20/2014 Time:  1030  Group Topic/Focus:  Making Healthy Choices:   The focus of this group is to help patients identify negative/unhealthy choices they were using prior to admission and identify positive/healthier coping strategies to replace them upon discharge.  Participation Level:  Active  Participation Quality:  Appropriate and Supportive  Affect:  Appropriate  Cognitive:  Alert, Appropriate and Oriented  Insight: Appropriate  Engagement in Group:  Supportive  Modes of Intervention:  Discussion and Education  Additional Comments:  Pt identified one positive way to intervene in their potentially unhealthy coping patterns  Daryn Hicks MCCOLLUM 06/20/2014, 11:09 AM
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
## 2014-06-20 NOTE — Progress Notes (Signed)
[place] Group Notes:  (Nursing/MHT/Case Management/Adjunct)  Date:  06/20/2014  Time:  9:05 PM  Type of Therapy:  Psychoeducational Skills  Participation Level:  Active  Participation Quality:  Appropriate  Affect:  Appropriate  Cognitive:  Appropriate  Insight:  Appropriate and Good  Engagement in Group:  Engaged  Modes of Intervention:  Discussion  Summary of Progress/Problems: Tonight in Wrap up group, [name] stated that her day has been an 33. When faced with a difficult situation she stated that being outdoors and just driving helps her to resolve issues. Her support system was going to be her NA meetings that she says she still attends and grief groups to help cope with the loss of both her parents. Her kids help to keep her moving though.   [name] 06/20/2014, 9:05 PM

## 2014-06-20 NOTE — Progress Notes (Signed)
Patient ID: [name], female   DOB: 1980/04/30, 34 y.o.   MRN: [id number]  Pt currently presents with a flat affect and anxious behavior. Pt frequently asks for medications today. Pt states "Can I have my Ativan, I am feeling like I do right before I have a seizure." Per self inventory, pt rates depression at a 6, hopelessness 7 and anxiety 8. Pt's daily goal is to "lower anxiety and sleep" and they intend to do so by "talk to Doc." Pt reports poor sleep, good concentration and a good appetite.   Pt provided with scheduled and prn medications per providers orders. Pt's labs and vitals were monitored throughout the day. Pt supported emotionally and encouraged to express concerns and questions. Pt educated on sleep and medications. Pt encouraged to utilize alternative techniques to manage anxiety like deep breathing, journal ing and progressive relaxation.  Pt's safety ensured with 15 minute and environmental checks. Pt currently denies SI/HI and A/V hallucinations. Pt verbally agrees to seek staff if SI/HI or A/VH occurs and to consult with staff before acting on these thoughts. Pt verbalizes understanding of teaching but needs reinforcement. Pt continues to request medications frequently.

## 2014-06-21 DIAGNOSIS — F3130 Bipolar disorder, current episode depressed, mild or moderate severity, unspecified: Secondary | ICD-10-CM

## 2014-06-21 MED ORDER — LAMOTRIGINE 25 MG PO TABS
25.0000 mg | ORAL_TABLET | Freq: Two times a day (BID) | ORAL | Status: DC
  Administered 2014-06-21 – 2014-06-22 (×2): 25 mg via ORAL
  Filled 2014-06-21 (×2): qty 1
  Filled 2014-06-21: qty 28
  Filled 2014-06-21: qty 1
  Filled 2014-06-21: qty 28
  Filled 2014-06-21: qty 1

## 2014-06-21 MED ORDER — QUETIAPINE FUMARATE 25 MG PO TABS
25.0000 mg | ORAL_TABLET | Freq: Every day | ORAL | Status: DC
  Administered 2014-06-21: 25 mg via ORAL
  Filled 2014-06-21 (×2): qty 1
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
## 2014-06-21 NOTE — Progress Notes (Signed)
Pt reports BM with prune juice.

## 2014-06-21 NOTE — Progress Notes (Signed)
[place] Assessment Progress Note    Patient stated that she had a decent day overall. She says that her medications are finally starting to work for her. She was happy to talk with her son today and can not wait to return home to her kids. Patient is scheduled for discharge on tomorrow and is a little nervous about it.

## 2014-06-21 NOTE — Progress Notes (Signed)
D. Pt had been up and visible in milieu this evening, did attend and participate in evening group activity. Pt did speak of having on-going sleep issues and spoke of a medication change that she hopes will be beneficial. Pt also spoke about not knowing where she will go upon discharge and is still weighing her options. Pt did receive all medications without incident this evening. A. Support and encouragement provided. R. Safety maintained, will continue to monitor.

## 2014-06-21 NOTE — Progress Notes (Signed)
D:Pt reports that she slept better last night. She rates anxiety as an 8, depression as a 4 and hopelessness as a 4 on 1-10 scale with 10 being the most. Pt c/o constipation. She was in bed much of the morning and has been out in the dayroom this afternoon. A:Offered support, encouragement and 15 minute checks. Gave warm prune juice for constipation. R:Pt denies si and hi. Safety maintained on the unit.

## 2014-06-21 NOTE — Progress Notes (Signed)
[facility] Therapy Notes  Date: 06/21/14 Time: 9:30am Location: [address] Dayroom  Group Topic: Stress Management  Goal Area(s) Addresses:  Patient will verbalize importance of using healthy stress management.  Patient will identify positive emotions associated with healthy stress management.   Behavioral Response: None   Intervention: Stress Management  Activity :  Progressive Muscle Relaxation.  LRT introduced and educated patients on progressive muscle relaxation.  A script was used to deliver the technique to patients.  Patients were asked to follow the script read aloud by LRT to engage in practicing the stress management technique.  Education:  Stress Management, Discharge Planning.   Education Outcome: Acknowledges edcuation/In group clarification offered/Needs additional education  Clinical Observations/Feedback: Patient did not attend group.  [name], LRT/CTRS         [name] A 06/21/2014 4:35 PM
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
## 2014-06-21 NOTE — [facility] Group Notes (Signed)
[facility] LCSW Aftercare Discharge Planning Group Note   06/21/2014 8:39 AM    Participation Quality:  Appropraite  Mood/Affect:  Appropriate  Depression Rating:  4  Anxiety Rating:  6  Thoughts of Suicide:  No  Will you contract for safety?   NA  Current AVH:  No  Plan for Discharge/Comments:  Patient attended discharge planning group and actively participated in group.  She reports she is better but does not feel ready to discharge home.  Patient will follow up with [facility] Pablo [facility].  Suicide prevention education reviewed and SPE document provided.   Transportation Means: Patient has transportation.   Supports:  Patient has a support system.   Jared Whorley, Eulas Post

## 2014-06-22 DIAGNOSIS — F3130 Bipolar disorder, current episode depressed, mild or moderate severity, unspecified: Secondary | ICD-10-CM | POA: Insufficient documentation

## 2014-06-22 MED ORDER — LAMOTRIGINE 25 MG PO TABS: 25.0000 mg | ORAL_TABLET | Freq: Two times a day (BID) | ORAL | Status: DC

## 2014-06-22 MED ORDER — TRAZODONE HCL 50 MG PO TABS: 50.0000 mg | ORAL_TABLET | Freq: Every evening | ORAL | Status: DC | PRN

## 2014-06-22 MED ORDER — CITALOPRAM HYDROBROMIDE 20 MG PO TABS: 20.0000 mg | ORAL_TABLET | Freq: Every day | ORAL | Status: DC

## 2014-06-22 MED ORDER — ZIPRASIDONE HCL 40 MG PO CAPS: 40.0000 mg | ORAL_CAPSULE | Freq: Every day | ORAL | Status: DC

## 2014-06-22 MED ORDER — METRONIDAZOLE 500 MG PO TABS: 500.0000 mg | ORAL_TABLET | Freq: Two times a day (BID) | ORAL | Status: DC

## 2014-06-22 MED ORDER — LEVONORGESTREL 20 MCG/24HR IU IUD: INTRAUTERINE_SYSTEM | INTRAUTERINE | Status: DC

## 2014-06-22 MED ORDER — NICOTINE POLACRILEX 2 MG MT GUM: 2.0000 mg | CHEWING_GUM | OROMUCOSAL | Status: DC | PRN

## 2014-06-22 MED ORDER — HYDROXYZINE HCL 50 MG PO TABS: 50.0000 mg | ORAL_TABLET | Freq: Three times a day (TID) | ORAL | Status: DC | PRN

## 2014-06-22 NOTE — Progress Notes (Signed)
D. Pt had been up and visible in milieu this evening, did attend and participate in evening group activity. Pt spoke about her day and impending discharge for in the morning and dealing with some anxiety as a result. Pt did state that she slept much better last night on seroquel and did not verbalize any complaints of pain. A. Support and encouragement provided. R. Safety maintained, will continue to monitor.

## 2014-06-22 NOTE — Tx Team (Signed)
Interdisciplinary Treatment Plan Update (Adult)  Date:  06/22/2014  Time Reviewed:  8:44 AM   Progress in Treatment: Attending groups: Patient is attending groups. Participating in groups:  Patient engages in discussion Taking medication as prescribed:  Patient is taking medications Tolerating medication:  Patient is tolerating medications Family/Significant othe contact made:   No, patient declinedcollateral contact Patient understands diagnosis:Yes, patient understands diagnosis and need for treatment Discussing patient identified problems/goals with staff:  Yes, patient is able to express goals/problems Medical problems stabilized or resolved:  Yes Denies suicidal/homicidal ideation: Yes, patient is denying SI/HI. Issues/concerns per patient self-inventory:   Other:   Discharge Plan or Barriers:  Home with outpatient follow up with [facility]  Reason for Continuation of Hospitalization:  Comments:   Additional comments:  Patient and CSW reviewed Patient Discharge Process Letter/Patient Involvement Form.  Patient verbalized understanding and signed form.  Patient and CSW also reviewed and identified patient's goals and treatment plan.  Patient verbalized understanding and agreed to plan.  Estimated length of stay:  Discharge today  New goal(s):  Review of initial/current patient goals per problem list:     Attendees: Patient 06/22/2014 8:44 AM   Family:   06/22/2014 8:44 AM   Physician:  Neita Garnet, MD 06/22/2014 8:44 AM   Nursing:   Darrol Angel, RN 06/22/2014 8:44 AM   Clinical Social Worker:  Joette Catching, [place] 06/22/2014 8:44 AM   Clinical Social Worker:  Erasmo Downer Drinkard, [place] 06/22/2014 8:44 AM   Case Manager:  Lars Pinks, RN 06/22/2014 8:44 AM   Other:  Grayland Ormond, RN 06/22/2014 8:44 AM  Other:   06/22/2014  8:44 AM   Other:  06/22/2014 8:44 AM   Other:  06/22/2014 8:44 AM   Other:  06/22/2014 8:44 AM   Other:  Jake Bathe Transition  Team Coordinator 06/22/2014 8:44 AM   Other:   06/22/2014 8:44 AM   Other:  06/22/2014 8:44 AM   Other:   06/22/2014 8:44 AM    Scribe for Treatment Team:   Concha Pyo, 06/22/2014   8:44 AM
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
## 2014-06-22 NOTE — Progress Notes (Signed)
  [facility] Adult Case Management Discharge Plan :  Will you be returning to the same living situation after discharge:  No. Patient is considering other options. At discharge, do you have transportation home?:  No.  Patient will need assistance with transportation back to [place]. Do you have the ability to pay for your medications: Yes,  Patient has Medicaid.  Release of information consent forms completed and in the chart;  Patient's signature needed at discharge.  Patient to Follow up at: Follow-up Information    Follow up with [facility] On 06/23/2014.   Why:  Wednesday, Jun 23, 2014 at [facility] information:   [address], [place]  [address]  [phone]      Patient denies SI/HI: Patient no longer endorsing SI/HI or other thoughts of self harm.   Safety Planning and Suicide Prevention discussed: .Reviewed with all patients during discharge planning group   Have you used any form of tobacco in the last 30 days? (Cigarettes, Smokeless Tobacco, Cigars, and/or Pipes): Yes  Has patient been referred to the Quitline?:Patient declined referral to Quitline.   Concha Pyo 06/22/2014, 8:46 AM

## 2014-06-22 NOTE — Progress Notes (Signed)
Discharge note: Pt received both written and verbal discharge instructions. Pt verbalized understanding of discharge instructions. Pt agreed to f/u appt and med regimen. Pt received sample meds, prescriptions, belongings and a note for work. Pt transported via [place] transportation to [facility] for d/c drop off.
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
## 2014-07-13 ENCOUNTER — Encounter (HOSPITAL_COMMUNITY): Payer: Self-pay

## 2014-07-13 ENCOUNTER — Emergency Department (HOSPITAL_COMMUNITY)
Admission: EM | Admit: 2014-07-13 | Discharge: 2014-07-14 | Disposition: A | Discharge status: Home / Self Care | Payer: Medicaid Other | Attending: Emergency Medicine | Admitting: Emergency Medicine

## 2014-07-13 ENCOUNTER — Emergency Department (HOSPITAL_COMMUNITY): Payer: Medicaid Other

## 2014-07-13 DIAGNOSIS — Y92410 Unspecified street and highway as the place of occurrence of the external cause: Secondary | ICD-10-CM | POA: Diagnosis not present

## 2014-07-13 DIAGNOSIS — S8992XA Unspecified injury of left lower leg, initial encounter: Secondary | ICD-10-CM | POA: Insufficient documentation

## 2014-07-13 DIAGNOSIS — S3992XA Unspecified injury of lower back, initial encounter: Secondary | ICD-10-CM | POA: Insufficient documentation

## 2014-07-13 DIAGNOSIS — S8991XA Unspecified injury of right lower leg, initial encounter: Secondary | ICD-10-CM | POA: Insufficient documentation

## 2014-07-13 DIAGNOSIS — Y9389 Activity, other specified: Secondary | ICD-10-CM | POA: Insufficient documentation

## 2014-07-13 DIAGNOSIS — Y998 Other external cause status: Secondary | ICD-10-CM | POA: Diagnosis not present

## 2014-07-13 DIAGNOSIS — Z8739 Personal history of other diseases of the musculoskeletal system and connective tissue: Secondary | ICD-10-CM | POA: Diagnosis not present

## 2014-07-13 DIAGNOSIS — F1910 Other psychoactive substance abuse, uncomplicated: Secondary | ICD-10-CM | POA: Diagnosis not present

## 2014-07-13 DIAGNOSIS — Z720 Tobacco use: Secondary | ICD-10-CM | POA: Diagnosis not present

## 2014-07-13 DIAGNOSIS — F4310 Post-traumatic stress disorder, unspecified: Secondary | ICD-10-CM | POA: Diagnosis not present

## 2014-07-13 DIAGNOSIS — F319 Bipolar disorder, unspecified: Secondary | ICD-10-CM | POA: Diagnosis not present

## 2014-07-13 DIAGNOSIS — Z3202 Encounter for pregnancy test, result negative: Secondary | ICD-10-CM | POA: Insufficient documentation

## 2014-07-13 DIAGNOSIS — S0990XA Unspecified injury of head, initial encounter: Secondary | ICD-10-CM | POA: Insufficient documentation

## 2014-07-13 DIAGNOSIS — Z79899 Other long term (current) drug therapy: Secondary | ICD-10-CM | POA: Insufficient documentation

## 2014-07-13 DIAGNOSIS — S199XXA Unspecified injury of neck, initial encounter: Secondary | ICD-10-CM | POA: Insufficient documentation

## 2014-07-13 DIAGNOSIS — Z041 Encounter for examination and observation following transport accident: Secondary | ICD-10-CM | POA: Diagnosis present

## 2014-07-13 DIAGNOSIS — F1410 Cocaine abuse, uncomplicated: Secondary | ICD-10-CM | POA: Diagnosis not present

## 2014-07-13 DIAGNOSIS — F1310 Sedative, hypnotic or anxiolytic abuse, uncomplicated: Secondary | ICD-10-CM | POA: Diagnosis not present

## 2014-07-13 LAB — CBC WITH DIFFERENTIAL/PLATELET
BASOS ABS: 0 10*3/uL (ref 0.0–0.1)
Basophils Relative: 0 % (ref 0–1)
Eosinophils Absolute: 0.1 10*3/uL (ref 0.0–0.7)
Eosinophils Relative: 1 % (ref 0–5)
HCT: 41.3 % (ref 36.0–46.0)
HEMOGLOBIN: 13.6 g/dL (ref 12.0–15.0)
LYMPHS ABS: 1.6 10*3/uL (ref 0.7–4.0)
LYMPHS PCT: 19 % (ref 12–46)
MCH: 27 pg (ref 26.0–34.0)
MCHC: 32.9 g/dL (ref 30.0–36.0)
MCV: 81.9 fL (ref 78.0–100.0)
MONO ABS: 0.7 10*3/uL (ref 0.1–1.0)
Monocytes Relative: 8 % (ref 3–12)
NEUTROS ABS: 5.8 10*3/uL (ref 1.7–7.7)
Neutrophils Relative %: 71 % (ref 43–77)
Platelets: 253 10*3/uL (ref 150–400)
RBC: 5.04 MIL/uL (ref 3.87–5.11)
RDW: 13.4 % (ref 11.5–15.5)
WBC: 8.2 10*3/uL (ref 4.0–10.5)

## 2014-07-13 LAB — CK: Total CK: 113 U/L (ref 38–234)

## 2014-07-13 LAB — COMPREHENSIVE METABOLIC PANEL
ALT: 22 U/L (ref 14–54)
ANION GAP: 8 (ref 5–15)
AST: 21 U/L (ref 15–41)
Albumin: 4.1 g/dL (ref 3.5–5.0)
Alkaline Phosphatase: 42 U/L (ref 38–126)
BUN: 16 mg/dL (ref 6–20)
CO2: 22 mmol/L (ref 22–32)
Calcium: 8.7 mg/dL — ABNORMAL LOW (ref 8.9–10.3)
Chloride: 107 mmol/L (ref 101–111)
Creatinine, Ser: 0.74 mg/dL (ref 0.44–1.00)
GFR calc Af Amer: >60 mL/min (ref >60)
Glucose, Bld: 98 mg/dL (ref 65–99)
Potassium: 3.6 mmol/L (ref 3.5–5.1)
SODIUM: 137 mmol/L (ref 135–145)
Total Bilirubin: 0.9 mg/dL (ref 0.3–1.2)
Total Protein: 6.7 g/dL (ref 6.5–8.1)

## 2014-07-13 LAB — URINALYSIS, ROUTINE W REFLEX MICROSCOPIC
Bilirubin Urine: NEGATIVE
GLUCOSE, UA: NEGATIVE mg/dL
Hgb urine dipstick: NEGATIVE
KETONES UR: NEGATIVE mg/dL
LEUKOCYTES UA: NEGATIVE
Nitrite: NEGATIVE
Protein, ur: NEGATIVE mg/dL
Specific Gravity, Urine: <1.005 — ABNORMAL LOW (ref 1.005–1.030)
Urobilinogen, UA: 0.2 mg/dL (ref 0.0–1.0)
pH: 5.5 (ref 5.0–8.0)

## 2014-07-13 LAB — I-STAT CG4 LACTIC ACID, ED: LACTIC ACID, VENOUS: 0.94 mmol/L (ref 0.5–2.0)

## 2014-07-13 LAB — I-STAT BETA HCG BLOOD, ED (MC, WL, AP ONLY): I-stat hCG, quantitative: <5 m[IU]/mL (ref <5)

## 2014-07-13 LAB — RAPID URINE DRUG SCREEN, HOSP PERFORMED
Amphetamines: NOT DETECTED
Barbiturates: NOT DETECTED
Benzodiazepines: POSITIVE — AB
Cocaine: POSITIVE — AB
OPIATES: NOT DETECTED
TETRAHYDROCANNABINOL: NOT DETECTED

## 2014-07-13 MED ORDER — HYDROXYZINE HCL 25 MG PO TABS
50.0000 mg | ORAL_TABLET | Freq: Three times a day (TID) | ORAL | Status: DC | PRN
  Administered 2014-07-13 – 2014-07-14 (×2): 50 mg via ORAL
  Filled 2014-07-13 (×2): qty 2

## 2014-07-13 MED ORDER — TRAZODONE HCL 50 MG PO TABS
50.0000 mg | ORAL_TABLET | Freq: Every evening | ORAL | Status: DC | PRN
  Administered 2014-07-13: 50 mg via ORAL
  Filled 2014-07-13: qty 1

## 2014-07-13 MED ORDER — CITALOPRAM HYDROBROMIDE 20 MG PO TABS
20.0000 mg | ORAL_TABLET | Freq: Every day | ORAL | Status: DC
  Administered 2014-07-14: 20 mg via ORAL
  Filled 2014-07-13 (×2): qty 1

## 2014-07-13 MED ORDER — LAMOTRIGINE 25 MG PO TABS
25.0000 mg | ORAL_TABLET | Freq: Two times a day (BID) | ORAL | Status: DC
  Filled 2014-07-13 (×4): qty 1

## 2014-07-13 MED ORDER — ACETAMINOPHEN 325 MG PO TABS
650.0000 mg | ORAL_TABLET | ORAL | Status: DC | PRN
Start: 2014-07-13 — End: 2014-07-14
  Administered 2014-07-13: 650 mg via ORAL
  Filled 2014-07-13: qty 2

## 2014-07-13 MED ORDER — IBUPROFEN 400 MG PO TABS
600.0000 mg | ORAL_TABLET | Freq: Three times a day (TID) | ORAL | Status: DC | PRN
  Administered 2014-07-13: 600 mg via ORAL
  Filled 2014-07-13: qty 2

## 2014-07-13 MED ORDER — SODIUM CHLORIDE 0.9 % IV BOLUS (SEPSIS)
1000.0000 mL | Freq: Once | INTRAVENOUS | Status: AC
  Administered 2014-07-13: 1000 mL via INTRAVENOUS

## 2014-07-13 MED ORDER — NICOTINE 21 MG/24HR TD PT24
21.0000 mg | MEDICATED_PATCH | Freq: Every day | TRANSDERMAL | Status: DC
  Administered 2014-07-13 – 2014-07-14 (×2): 21 mg via TRANSDERMAL
  Filled 2014-07-13 (×2): qty 1

## 2014-07-13 MED ORDER — ONDANSETRON HCL 4 MG PO TABS: 4.0000 mg | ORAL_TABLET | Freq: Three times a day (TID) | ORAL | Status: DC | PRN

## 2014-07-13 NOTE — ED Notes (Signed)
Pt had IVC papers served by [employer]  PD.   Pt cooperative.  Denies HI or SI.

## 2014-07-13 NOTE — BH Assessment (Signed)
[place] Assessment Progress Note  Called and scheduled pt's tele assessment with this clinician as well as gathered clinical information from [place].  Shaune Pascal, MS, [facility] Therapeutic Triage Specialist [facility]

## 2014-07-13 NOTE — ED Notes (Signed)
Husband called to check on pt.   Husband advised nurse that pt has been up all night doing coke and that she is doing this about one night a week.    Pt states she does not want to talk to him.  Husband states he is fed up with this and he is having her committed.

## 2014-07-13 NOTE — ED Notes (Signed)
Pt awoke c/o generalized aches, and requesting something for pain.  Ibuprofen given.
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
## 2014-07-13 NOTE — ED Notes (Signed)
Pt has the following locked up in ED pt locker room.  1 pink and white bra 1 pair of flip flops 1 purse (She didn't want this locked up with security) 1 pair of underwear 1 white t-shirt 1 pair gray pants
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
## 2014-07-14 DIAGNOSIS — F1910 Other psychoactive substance abuse, uncomplicated: Secondary | ICD-10-CM | POA: Diagnosis not present

## 2014-07-14 NOTE — Discharge Instructions (Signed)
Polysubstance Abuse When people abuse more than one drug or type of drug it is called polysubstance or polydrug abuse. For example, many smokers also drink alcohol. This is one form of polydrug abuse. Polydrug abuse also refers to the use of a drug to counteract an unpleasant effect produced by another drug. It may also be used to help with withdrawal from another drug. People who take stimulants may become agitated. Sometimes this agitation is countered with a tranquilizer. This helps protect against the unpleasant side effects. Polydrug abuse also refers to the use of different drugs at the same time.  Anytime drug use is interfering with normal living activities, it has become abuse. This includes problems with family and friends. Psychological dependence has developed when your mind tells you that the drug is needed. This is usually followed by physical dependence which has developed when continuing increases of drug are required to get the same feeling or "high". This is known as addiction or chemical dependency. A person's risk is much higher if there is a history of chemical dependency in the family. SIGNS OF CHEMICAL DEPENDENCY  You have been told by friends or family that drugs have become a problem.  You fight when using drugs.  You are having blackouts (not remembering what you do while using).  You feel sick from using drugs but continue using.  You lie about use or amounts of drugs (chemicals) used.  You need chemicals to get you going.  You are suffering in work performance or in school because of drug use.  You get sick from use of drugs but continue to use anyway.  You need drugs to relate to people or feel comfortable in social situations.  You use drugs to forget problems. "Yes" answered to any of the above signs of chemical dependency indicates there are problems. The longer the use of drugs continues, the greater the problems will become. If there is a family history of  drug or alcohol use, it is best not to experiment with these drugs. Continual use leads to tolerance. After tolerance develops more of the drug is needed to get the same feeling. This is followed by addiction. With addiction, drugs become the most important part of life. It becomes more important to take drugs than participate in the other usual activities of life. This includes relating to friends and family. Addiction is followed by dependency. Dependency is a condition where drugs are now needed not just to get high, but to feel normal. Addiction cannot be cured but it can be stopped. This often requires outside help and the care of professionals. Treatment centers are listed in the yellow pages under: Cocaine, Narcotics, and Alcoholics Anonymous. Most hospitals and clinics can refer you to a specialized care center. Talk to your caregiver if you need help. Document Released: 09/13/2004 Document Revised: 04/16/2011 Document Reviewed: 01/22/2005 [facility] Patient Information 2015 [place], [place]. This information is not intended to replace advice given to you by your health care provider. Make sure you discuss any questions you have with your health care provider.   Emergency Department Resource Guide 1) Find a Doctor and Pay Out of Pocket Although you won't have to find out who is covered by your insurance plan, it is a good idea to ask around and get recommendations. You will then need to call the office and see if the doctor you have chosen will accept you as a new patient and what types of options they offer for patients who are  self-pay. Some doctors offer discounts or will set up payment plans for their patients who do not have insurance, but you will need to ask so you aren't surprised when you get to your appointment.  2) Contact Your Local Health Department Not all health departments have doctors that can see patients for sick visits, but many do, so it is worth a call to see if yours does. If  you don't know where your local health department is, you can check in your phone book. The CDC also has a tool to help you locate your state's health department, and many state websites also have listings of all of their local health departments.  3) Find a [facility] If your illness is not likely to be very severe or complicated, you may want to try a walk in clinic. These are popping up all over the country in pharmacies, drugstores, and shopping centers. They're usually staffed by nurse practitioners or physician assistants that have been trained to treat common illnesses and complaints. They're usually fairly quick and inexpensive. However, if you have serious medical issues or chronic medical problems, these are probably not your best option.  No Primary Care Doctor: - Call Health Connect at  [phone] - they can help you locate a primary care doctor that  accepts your insurance, provides certain services, etc. - Physician Referral Service- [phone]  Chronic Pain Problems: Organization         Address  Phone   Notes  [facility]  [phone] Patients need to be referred by their primary care doctor.   Medication Assistance: Organization         Address  Phone   Notes  [facility] Medication [facility] [address] [place], [place] [address] [phone] --Must be a resident of [facility] -- Must have NO insurance coverage whatsoever (no Medicaid/ Medicare, etc.) -- The pt. MUST have a primary care doctor that directs their care regularly and follows them in the community   MedAssist  [phone]   [employer]  [phone]    Agencies that provide inexpensive medical care: Organization         Address  Phone   Notes  [name]  [phone]   [facility]    [phone]   [facility] [place], [place] [address] [phone]   [place] [address], [place] [phone]   Planned Parenthood    [phone]   [facility]    [phone]   [place] and [address], [place] Phone:  [phone], Fax:  [phone] Hours of Operation:  9 am - 6 pm, M-F.  Also accepts Medicaid/Medicare and self-pay.  [facility] for [place], [address], [place] Phone: [phone], Fax: [phone]. Hours of Operation:  8:30 am - 5:30 pm, M-F.  Also accepts Medicaid and self-pay.  [facility] [place] [address], [place] Phone: [phone]   [place], [place], [place] [phone], Ext. [phone] Mondays & Thursdays: 7-9 AM.  First 15 patients are seen on a first come, first serve basis.    [place] Providers:  Organization         Address  Phone   Notes  [facility] [address], Arlis Porta, [address] [phone] Also accepts self-pay patients.  [facility] [address] [place], [place]  [phone]   [place], [address], [place] [phone]   [facility] [address] [place] [phone]   Lucianne Lei [address], [place]   [phone] Only accepts [place] Access [place] patients after they have their name applied to their card.   Self-Pay (no insurance) in [facility]:  Organization         Address  Phone   Notes  Sickle Cell Patients, [facility] [place] [phone]   [facility] Urgent Care [place] [phone]   [facility] [place]  [place], [place],  [phone]   [facility]/Dr. Osei-Bonsu  [address] [place] or [address], [place] [phone]  Phone number for both [place] and [place] locations is the same.  Urgent Medical and [facility] [address], [place] [phone]   [facility] [address], [place] or [address] [phone]   [facility] [address] [place] [phone], phone; [phone], fax Sees patients 1st and 3rd Saturday of every month.  Must not qualify for public or private insurance (i.e. Medicaid, Medicare, [place] Health Choice, Veterans' Benefits)  Household income should be no more than 200% of the poverty level The clinic cannot treat you if you are pregnant or think you are pregnant  Sexually transmitted diseases are not treated at the clinic.    Dental Care: Organization         Address  Phone  Notes  [facility] Department of [facility] [place] [phone] Accepts children up to age 46 who are enrolled in [place] or [place]; pregnant women with a Medicaid card; and children who have applied for Medicaid or [place] Health Choice, but were declined, whose parents can pay a reduced fee at time of service.  [facility] Department of [facility]  [address], [place] [phone] Accepts children up to age 75 who are enrolled in [place] or [place]; pregnant women with a Medicaid card; and children who have applied for Medicaid or [place] Health Choice, but were declined, whose parents can pay a reduced fee at time of service.  [facility] Adult Dental Access PROGRAM  [place] [phone] Patients are seen by appointment only. Walk-ins are not accepted. Marcus will see patients 83 years of age and older. Monday - Tuesday (8am-5pm) Most Wednesdays (8:30-5pm) $30 per visit, cash only  [facility] Adult Dental Access PROGRAM  [address], [facility] [phone] Patients are seen by appointment  only. Walk-ins are not accepted. Between will see patients 59 years of age and older. One Wednesday Evening (Monthly: Volunteer Based).  $30 per visit, cash only  [place]  [phone] for adults; Children under age 60, call Graduate Pediatric Dentistry at [phone]. Children aged 71-14, please call [phone] to request a pediatric application.  Dental services are provided in all areas of dental care including fillings, crowns and bridges, complete and partial dentures, implants, gum treatment, root canals, and  extractions. Preventive care is also provided. Treatment is provided to both adults and children. Patients are selected via a lottery and there is often a waiting list.   [facility] [address] [place]  [phone] [email or web address]   [facility] [address] [place], [place] [phone], Ext. [phone] Second and Fourth Thursday of each month, opens at 6:30 AM; Clinic ends at 9 AM.  Patients are seen on a first-come first-served basis, and a limited number are seen during each clinic.   [facility]  [address] Hillard Danker [place], [place] [phone]   Eligibility Requirements You must have lived in [place], [place], or [place] counties for at least the last three months.   You cannot be eligible for state or federal sponsored [employer], including [employer], [place], or [employer].   You generally cannot be eligible for healthcare insurance through your employer.    How to apply: Eligibility screenings are held every Tuesday and Wednesday afternoon from 1:00 pm until 4:00 pm. You do not need an appointment for the interview!  [facility] [address], [place], [place]   [place]  [place] Department  [place]  [phone]    Behavioral Health  Resources in the Community: Intensive Outpatient Programs Organization         Address  Phone  Notes  [place]. [address] [place], [place] [phone]   [facility] Outpatient [address], [place], [place]   ADS: Alcohol & Drug Svcs [address], [place], [place]   [place] [address]  [place], [place] or [phone]   Substance Abuse Resources Organization         Address  Phone  Notes  Alcohol and Drug Services  [phone]   [place]  [phone]   The [place]   Chinita Pester  [phone]   Residential & Outpatient Substance Abuse Program  [phone]   Psychological Services Organization         Address  Phone  Notes  [facility] [place]  [place]  [phone]   [place] [address] [place] or [phone]    Mobile Crisis Teams Organization         Address  Phone  Notes  Therapeutic Alternatives, Mobile Crisis Care Unit  [phone]   [facility] Psychotherapeutic Services  [address] [place], [place]   [place] Levels [address], [place] [phone]    Self-Help/Support Groups Organization         Address  Phone             Notes  [place]. of [place] - variety of support groups  [place] Call for more information  Narcotics Anonymous (NA), [facility] [address], [employer] [place]  2 meetings at this location   Special educational needs teacher         Address  Phone  Notes  ASAP Residential Treatment [place],    [place]  [phone]   [facility]  [address] [place] 735329, [place], [place]   [place], [place] [phone] Admissions: 8am-3pm M-F  Incentives Substance [place] [address],    [place], [place] [phone]   [facility] [address] [place], [place], [place]   [facility] [address]  [place], [place]   Insight Programs - Intensive Outpatient [address] Kristeen Mans [address], [place], [place]   [facility]al Health Services ([place].) [place].,  [place], [place] 1-7433593517 or [phone]   Residential Treatment Services (RTS) [address] [place], [place] Accepts Medicaid  [facility] [place] [address],  [place] [phone] Substance Abuse/Addiction Treatment   [facility] Organization         Address  Phone  Notes  [facility]  [phone]   Domenic Schwab, PhD [address] Arlis Porta [place], [place]   [phone] or [phone]   [place], [place] [phone]   [facility] [address] [place], [place] [phone] Insurance/Medicaid/sponsorship through [facility] and Families [address], [address] [place]                                    [place], [place] [phone] [place] [address], [place] [phone]    Dr. Adele Schilder  [phone]   [facility] Dept. 1) [address], [place] 2) [place] 3)  [place] 65, [place] [phone]  [phone]   [place] [phone] or [phone] (After Hours)      Substance Abuse Treatment Programs  Intensive Outpatient Programs [place]     [place]. [place], [place]       [facility] [address] [place], [place]  [facility]     (Inpatient and outpatient)     [address]  Dr.           [place] [phone] (Suboxone and Methadone)  [place], [place] [address]      [place] [address] [place], [place]  [facility] [place] (Outpatient/Inpatient, Chemical)    (insurance only) [phone]             [facility]aring Services ([place]) [place], [place]     [facility]     [address]     [place], [place]       [facility] (for caregivers and family) [phone] Kristeen Mans. [place], [place]      Residential Treatment Programs [facility]      [address], [place], [place] [address]  [phone]       [facility] [address], [place], [place] [address] [phone]  Path of [place]        [phone]       [facility] [place] [phone]  [facility] ([place].)             [address]  [place], [place] or [place], [address] [phone]  [facility]    [address]      [place], [place]       [facility] [address] [place], [place]  [place]   [address] [place], [place] [address]     [phone]      Admissions: 8am-3pm M-F  Residential Treatment Services (RTS) [address] [place], [place]  BATS Program: Residential Program [phone] Days)   [place], [place] or [phone]     ADATC: [place], [place] (Walk in Hours over the weekend or by referral)  [facility] [place], [place], [place] [address] [phone]  Crisis Mobile: Therapeutic  Alternatives:  [phone] (for crisis response 24 hours a day) [facility] Hotline:      773-845-0209 Outpatient Psychiatry and Counseling  Therapeutic Alternatives: Mobile Crisis Management 24 hours:  [phone]  [facility] of the [employer] sliding scale fee and walk in schedule: M-F 8am-12pm/1pm-3pm [place], [place] [address] [address] [place], [place] [address] [phone]  [facility] (Formerly known as The [employer])- new patient walk-in appointments available Monday - Friday 8am -3pm.          [address] [place], [place] [address] [phone] or crisis line- Hughes Services/ Intensive Outpatient Therapy Program [place], [place] [address] [place]      [phone] N. [place], [place] [address]                 [place]   [facility] [phone]. [place], [place] [address]   [employer] of Care          [address] Johnette Abraham  [place], [place] [address]       [phone]  [facility] [address] [place], [place] [address] [phone]  [facility]    [address] [place], [place] [address]     [place], [place] Joycelyn Man     [place] [address]     [phone]       [facility] [place] [address]  [facility]     [address], [place], [place]  [place], [place] [address] [place]     [address]     [place], [place]  [address]     [phone]       [facility] [address] [employer], [place] [address] [phone] Resources for Temporary Residential Assistance/Crisis [facility]) M-F 8am-3pm   [address] [place], [place] [address]   [phone] Services include: laundry, barbering, support groups, case management, phone  & computer access, showers, AA/NA mtgs, mental health/substance abuse nurse, job skills class, disability information, VA assistance, spiritual classes, etc.   [facility]   [address], [place]     [place]ma              [employer] (women and children)       [place]. [place], [place] [address] [phone] [email or web address] for application and process Application Required  [facility]   [address]    [place] [address]     [phone]                    [place], [place] [address] [phone](FHLKTGYB application appt.) Application Required  [facility] (women only)    [address]     [place], [place] [address]     [phone]      Intake starts 6pm daily Need valid ID, SSC, & Police report [employer] [address] [place], [place] [phone] Application Required  [employer] (men only)     [place].      [place], [place]       [place] (Pregnant women only) [address] [place], [place]  [facility]      [place] Dani Gobble.      [place], [place] [address]     [phone]             [facility] [address] [place], [place] 90 day commitment/SA/Application process  [facility](men only)     [address]     [place],  [place]       Check-in at [facility] [address] [place],  [address] [phone] Men/Women/Women and Children must be there by 7 pm  [place], [place]

## 2014-07-14 NOTE — ED Notes (Signed)
telepsych re-eval in progress

## 2014-07-14 NOTE — ED Provider Notes (Signed)
Patient evaluated by psychiatry and they recommend discharge. Psychiatry does not feel that she is a risk to herself. She is not homicidal or suicidal. She does have a history of polysubstance abuse, this can be addressed as an outpatient.  Orpah Greek, MD 07/14/14 [phone]
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
## 2014-08-03 ENCOUNTER — Encounter (HOSPITAL_COMMUNITY): Payer: Self-pay | Admitting: *Deleted

## 2014-08-03 ENCOUNTER — Observation Stay (HOSPITAL_COMMUNITY)
Admission: AD | Admit: 2014-08-03 | Discharge: 2014-08-04 | Disposition: A | Discharge status: Home / Self Care | Payer: MEDICAID | Attending: Family | Admitting: Family

## 2014-08-03 ENCOUNTER — Encounter (HOSPITAL_COMMUNITY): Payer: Self-pay

## 2014-08-03 ENCOUNTER — Emergency Department (HOSPITAL_COMMUNITY)
Admission: EM | Admit: 2014-08-03 | Discharge: 2014-08-03 | Disposition: A | Discharge status: Other Acute Inpatient Hospital | Payer: MEDICAID | Attending: Emergency Medicine | Admitting: Emergency Medicine

## 2014-08-03 DIAGNOSIS — F4310 Post-traumatic stress disorder, unspecified: Secondary | ICD-10-CM | POA: Diagnosis not present

## 2014-08-03 DIAGNOSIS — F1410 Cocaine abuse, uncomplicated: Secondary | ICD-10-CM | POA: Diagnosis not present

## 2014-08-03 DIAGNOSIS — F909 Attention-deficit hyperactivity disorder, unspecified type: Secondary | ICD-10-CM | POA: Insufficient documentation

## 2014-08-03 DIAGNOSIS — F319 Bipolar disorder, unspecified: Secondary | ICD-10-CM | POA: Diagnosis not present

## 2014-08-03 DIAGNOSIS — F1210 Cannabis abuse, uncomplicated: Secondary | ICD-10-CM | POA: Insufficient documentation

## 2014-08-03 DIAGNOSIS — F1510 Other stimulant abuse, uncomplicated: Secondary | ICD-10-CM | POA: Diagnosis not present

## 2014-08-03 DIAGNOSIS — F32A Depression, unspecified: Secondary | ICD-10-CM

## 2014-08-03 DIAGNOSIS — M1990 Unspecified osteoarthritis, unspecified site: Secondary | ICD-10-CM | POA: Insufficient documentation

## 2014-08-03 DIAGNOSIS — F1994 Other psychoactive substance use, unspecified with psychoactive substance-induced mood disorder: Secondary | ICD-10-CM | POA: Diagnosis not present

## 2014-08-03 DIAGNOSIS — F1414 Cocaine abuse with cocaine-induced mood disorder: Principal | ICD-10-CM | POA: Insufficient documentation

## 2014-08-03 DIAGNOSIS — Z888 Allergy status to other drugs, medicaments and biological substances status: Secondary | ICD-10-CM | POA: Insufficient documentation

## 2014-08-03 DIAGNOSIS — F17210 Nicotine dependence, cigarettes, uncomplicated: Secondary | ICD-10-CM | POA: Diagnosis not present

## 2014-08-03 DIAGNOSIS — Z008 Encounter for other general examination: Secondary | ICD-10-CM | POA: Diagnosis present

## 2014-08-03 DIAGNOSIS — Z79899 Other long term (current) drug therapy: Secondary | ICD-10-CM | POA: Diagnosis not present

## 2014-08-03 DIAGNOSIS — Z720 Tobacco use: Secondary | ICD-10-CM | POA: Diagnosis not present

## 2014-08-03 DIAGNOSIS — G40909 Epilepsy, unspecified, not intractable, without status epilepticus: Secondary | ICD-10-CM | POA: Diagnosis not present

## 2014-08-03 DIAGNOSIS — F1310 Sedative, hypnotic or anxiolytic abuse, uncomplicated: Secondary | ICD-10-CM | POA: Diagnosis not present

## 2014-08-03 DIAGNOSIS — F329 Major depressive disorder, single episode, unspecified: Secondary | ICD-10-CM

## 2014-08-03 DIAGNOSIS — Z8739 Personal history of other diseases of the musculoskeletal system and connective tissue: Secondary | ICD-10-CM | POA: Diagnosis not present

## 2014-08-03 DIAGNOSIS — R45851 Suicidal ideations: Secondary | ICD-10-CM

## 2014-08-03 DIAGNOSIS — G4700 Insomnia, unspecified: Secondary | ICD-10-CM | POA: Diagnosis not present

## 2014-08-03 DIAGNOSIS — Z3202 Encounter for pregnancy test, result negative: Secondary | ICD-10-CM | POA: Diagnosis not present

## 2014-08-03 DIAGNOSIS — F39 Unspecified mood [affective] disorder: Secondary | ICD-10-CM | POA: Diagnosis present

## 2014-08-03 DIAGNOSIS — F1910 Other psychoactive substance abuse, uncomplicated: Secondary | ICD-10-CM

## 2014-08-03 LAB — CBC
HCT: 44.1 % (ref 36.0–46.0)
Hemoglobin: 14.4 g/dL (ref 12.0–15.0)
MCH: 26.6 pg (ref 26.0–34.0)
MCHC: 32.7 g/dL (ref 30.0–36.0)
MCV: 81.5 fL (ref 78.0–100.0)
Platelets: 349 10*3/uL (ref 150–400)
RBC: 5.41 MIL/uL — ABNORMAL HIGH (ref 3.87–5.11)
RDW: 13.2 % (ref 11.5–15.5)
WBC: 11.2 10*3/uL — ABNORMAL HIGH (ref 4.0–10.5)

## 2014-08-03 LAB — PREGNANCY, URINE: PREG TEST UR: NEGATIVE

## 2014-08-03 LAB — COMPREHENSIVE METABOLIC PANEL
ALT: 16 U/L (ref 14–54)
AST: 19 U/L (ref 15–41)
Albumin: 4.5 g/dL (ref 3.5–5.0)
Alkaline Phosphatase: 59 U/L (ref 38–126)
Anion gap: 10 (ref 5–15)
BILIRUBIN TOTAL: 0.7 mg/dL (ref 0.3–1.2)
BUN: 19 mg/dL (ref 6–20)
CO2: 25 mmol/L (ref 22–32)
Calcium: 9.7 mg/dL (ref 8.9–10.3)
Chloride: 103 mmol/L (ref 101–111)
Creatinine, Ser: 0.91 mg/dL (ref 0.44–1.00)
GFR calc Af Amer: >60 mL/min (ref >60)
Glucose, Bld: 106 mg/dL — ABNORMAL HIGH (ref 65–99)
Potassium: 3.6 mmol/L (ref 3.5–5.1)
SODIUM: 138 mmol/L (ref 135–145)
Total Protein: 7.5 g/dL (ref 6.5–8.1)

## 2014-08-03 LAB — RAPID URINE DRUG SCREEN, HOSP PERFORMED
Amphetamines: POSITIVE — AB
BARBITURATES: NOT DETECTED
BENZODIAZEPINES: POSITIVE — AB
Cocaine: POSITIVE — AB
Opiates: NOT DETECTED
TETRAHYDROCANNABINOL: POSITIVE — AB

## 2014-08-03 LAB — ETHANOL

## 2014-08-03 LAB — SALICYLATE LEVEL: Salicylate Lvl: <4 mg/dL (ref 2.8–30.0)

## 2014-08-03 LAB — ACETAMINOPHEN LEVEL: Acetaminophen (Tylenol), Serum: <10 ug/mL — ABNORMAL LOW (ref 10–30)

## 2014-08-03 MED ORDER — CHLORDIAZEPOXIDE HCL 25 MG PO CAPS
25.0000 mg | ORAL_CAPSULE | Freq: Four times a day (QID) | ORAL | Status: DC | PRN
  Filled 2014-08-03: qty 1

## 2014-08-03 MED ORDER — HYDROXYZINE HCL 25 MG PO TABS
25.0000 mg | ORAL_TABLET | Freq: Four times a day (QID) | ORAL | Status: DC | PRN
  Administered 2014-08-03: 25 mg via ORAL
  Filled 2014-08-03: qty 1

## 2014-08-03 MED ORDER — CHLORDIAZEPOXIDE HCL 25 MG PO CAPS: 25.0000 mg | ORAL_CAPSULE | Freq: Three times a day (TID) | ORAL | Status: DC

## 2014-08-03 MED ORDER — MAGNESIUM HYDROXIDE 400 MG/5ML PO SUSP: 30.0000 mL | Freq: Every day | ORAL | Status: DC | PRN

## 2014-08-03 MED ORDER — LOPERAMIDE HCL 2 MG PO CAPS
2.0000 mg | ORAL_CAPSULE | ORAL | Status: DC | PRN
Start: 2014-08-03 — End: 2014-08-04

## 2014-08-03 MED ORDER — CHLORDIAZEPOXIDE HCL 25 MG PO CAPS
25.0000 mg | ORAL_CAPSULE | Freq: Four times a day (QID) | ORAL | Status: DC
  Administered 2014-08-03 – 2014-08-04 (×4): 25 mg via ORAL
  Filled 2014-08-03 (×3): qty 1

## 2014-08-03 MED ORDER — CHLORDIAZEPOXIDE HCL 25 MG PO CAPS: 25.0000 mg | ORAL_CAPSULE | Freq: Every day | ORAL | Status: DC

## 2014-08-03 MED ORDER — ACETAMINOPHEN 325 MG PO TABS
650.0000 mg | ORAL_TABLET | Freq: Four times a day (QID) | ORAL | Status: DC | PRN
  Administered 2014-08-03: 650 mg via ORAL
  Filled 2014-08-03: qty 2

## 2014-08-03 MED ORDER — GABAPENTIN 300 MG PO CAPS
300.0000 mg | ORAL_CAPSULE | Freq: Three times a day (TID) | ORAL | Status: DC
  Administered 2014-08-03 – 2014-08-04 (×3): 300 mg via ORAL
  Filled 2014-08-03 (×3): qty 1

## 2014-08-03 MED ORDER — ALUM & MAG HYDROXIDE-SIMETH 200-200-20 MG/5ML PO SUSP: 30.0000 mL | ORAL | Status: DC | PRN

## 2014-08-03 MED ORDER — THIAMINE HCL 100 MG/ML IJ SOLN
100.0000 mg | Freq: Once | INTRAMUSCULAR | Status: AC
  Administered 2014-08-03: 100 mg via INTRAMUSCULAR
  Filled 2014-08-03: qty 2

## 2014-08-03 MED ORDER — ADULT MULTIVITAMIN W/MINERALS CH
1.0000 | ORAL_TABLET | Freq: Every day | ORAL | Status: DC
  Administered 2014-08-03 – 2014-08-04 (×2): 1 via ORAL
  Filled 2014-08-03 (×2): qty 1

## 2014-08-03 MED ORDER — CHLORDIAZEPOXIDE HCL 25 MG PO CAPS: 25.0000 mg | ORAL_CAPSULE | ORAL | Status: DC

## 2014-08-03 MED ORDER — ONDANSETRON 4 MG PO TBDP: 4.0000 mg | ORAL_TABLET | Freq: Four times a day (QID) | ORAL | Status: DC | PRN

## 2014-08-03 MED ORDER — GUAIFENESIN ER 600 MG PO TB12
1200.0000 mg | ORAL_TABLET | Freq: Two times a day (BID) | ORAL | Status: DC
  Administered 2014-08-03 – 2014-08-04 (×2): 1200 mg via ORAL
  Filled 2014-08-03 (×8): qty 2

## 2014-08-03 MED ORDER — VITAMIN B-1 100 MG PO TABS
100.0000 mg | ORAL_TABLET | Freq: Every day | ORAL | Status: DC
  Administered 2014-08-04: 100 mg via ORAL
  Filled 2014-08-03: qty 1

## 2014-08-03 MED ORDER — LAMOTRIGINE 25 MG PO TABS
25.0000 mg | ORAL_TABLET | Freq: Two times a day (BID) | ORAL | Status: DC
  Administered 2014-08-03 – 2014-08-04 (×2): 25 mg via ORAL
  Filled 2014-08-03 (×2): qty 1

## 2014-08-03 MED ORDER — TRAZODONE HCL 50 MG PO TABS
50.0000 mg | ORAL_TABLET | Freq: Every evening | ORAL | Status: DC | PRN
  Administered 2014-08-03: 50 mg via ORAL
  Filled 2014-08-03: qty 1

## 2014-08-03 MED ORDER — CITALOPRAM HYDROBROMIDE 20 MG PO TABS
20.0000 mg | ORAL_TABLET | Freq: Every day | ORAL | Status: DC
  Administered 2014-08-03 – 2014-08-04 (×2): 20 mg via ORAL
  Filled 2014-08-03 (×2): qty 1

## 2014-08-03 NOTE — Progress Notes (Signed)
Patient in bed resting at the beginning of this shift. Her mood and affects sad and depressed. She endorsed passive Si but contracted for safety. Writer encouraged and supported patient. She also reported pain on her back and complaint of anxiety . She rated her pain at 7 on a scale of 1-10 with 10 the worst. Tylenol given for pain and Vistaril for the anxiety. Q 15 minute check continues as ordered to maintain safety.

## 2014-08-03 NOTE — BH Assessment (Signed)
[place] Assessment Progress Note  Per Letitia Libra, RN, [facility], this pt has been accepted to [facility] Observation Bed 3.  Pt has signed Voluntary Admission and Consent for Treatment, as well as Consent to Release Information, and signed forms have been faxed to [facility].  EDP Tatyana, PA has been notified and concurs with decision to transfer pt.  Pt's nurse has also been notified.  She agrees to send original paperwork along with pt via [facility], and to call report to [phone].  Jalene Mullet, [place] Triage Specialist [phone]

## 2014-08-03 NOTE — BHH Counselor (Addendum)
[place] Assessment Progress Note  Per [name], DNP, pt is to be kept overnight and d/c tomorrow w/ OP resources. Pt did not communicate to DNP her desire for long term treatment. Counselor asked pt if her desires had changed. Pt indicated that they had not, but didn't really have a reason as to why she did not indicate her desire for long term treatment to DNP.  Counselor discussed inpatient treatment options with pt. Pt shared that she has not income, ruling out most halfway houses and private treatment facilities. Pt also shared that she previously (about 2 years ago) stayed at an [facility] and was voted out after a couple of weeks due to the other residents not feeling that she needed help. She indicated that she would not be able to go back to an [facility] until she pays $300, the amount accrued for her couple of week there. Due to pt being a female, having Medicaid, and living in [place], [place], [place], and [place] are also ruled out for her.   Pt disclosed that both her parents are deceased and she has no other family (save for her children, who are minors, and who live with their father) and no supports. She shared that she lives with her children's elderly grandmother.   Counselor will continue to look for resources in [facility] for pt.   Kenna Gilbert. Lovena Le, [place], [place] Disposition Counselor     [phone] t/c to [facility] in [place] [phone]) to inquire of services. They indicated that they do not accept insurance and all services are private pay. Only other place to provide services in the area is [facility], they indicated. T/c to [facility] [phone]) and left a message with Simonne Martinet, concerning their residential program.    [phone] Counselor rec'd return call from Eugene with [facility] ([facility]). She faxed over a copy of the referral form. Counselor completed referral form, w/ patient's assistance and faxed info, along w/ assessment notes and  MAR to [facility]. Levada Dy indicated that she would review and call back with a decision tomorrow.
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
## 2014-08-03 NOTE — ED Notes (Addendum)
Pt sent by [facility] for medical clearance.  C/o increased depression, SI w/ plan to cut self, cocaine use, and L index finger laceration.  Last cocaine use x 6hrs ago.  Sts "the people around me were using Meth, so I may have ingested some of that too."  Pt reports recently divorce, custody battle, and job loss as stressors.  Denies HI/AV.       Per Agricultural consultant, Pt has a bed at [facility] Obs unit.
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
## 2014-08-03 NOTE — BHH Counselor (Signed)
[facility] Assessment Progress Note  Counselor spoke with pt concerning discharge planning. She denies any current HI or AVH. She endorses SI w/ no plan and admits that, when stressed, SI is her baseline feeling.   Pt indicates that she wants to go to a long term treatment center, "like 3 to 6 months" to help her deal with her emotional issues, as well as her cocaine addiction. Pt shared that her cocaine addiction stems primarily from her not being able to handle and cope with her personal and emotional issues. Counselor discussed OP therapy with pt, but pt indicated that resources are limited where she lives in [place].  She shared that she was going to [facility], but that they were "terrible".   Kenna Gilbert. Lovena Le, [place], [place] Disposition Counselor

## 2014-08-03 NOTE — Plan of Care (Signed)
[facility] Observation Crisis Plan  Reason for Crisis Plan:  Crisis Stabilization and Substance Abuse   Plan of Care:  Referral for Substance Abuse  Family Support:      Current Living Environment:  Living Arrangements: Non-relatives/Friends  Insurance:   Hospital Account    Name Acct ID Class Status Primary Coverage   [place], [place] [id number] [facility]        Guarantor Account (for Hospital Account [id number])    Name Relation to Pt Service Area Active? Acct Type   [place] Both Yes [facility]   Address Phone       [place], [place] [address] [phone])          Coverage Information (for Hospital Account [id number])    F/O Payor/Plan Precert #   [place] [employer] [employer]    Subscriber Subscriber #   Relena, Ivancic [id number] [facility]   Address Phone   [address] [place], [place] [address]       Legal Guardian:     Primary Care Provider:  [place]  Current Outpatient Providers:  [facility]  Psychiatrist:  Name of Psychiatrist: None Reported  Counselor/Therapist:  Name of Therapist: None Reported  Compliant with Medications:  Yes  Additional Information:   Debbrah Alar 6/28/201612:43 PM

## 2014-08-03 NOTE — Progress Notes (Signed)
[facility] INPATIENT:  Family/Significant Other Suicide Prevention Education  Suicide Prevention Education:  Patient Refusal for Family/Significant Other Suicide Prevention Education: The patient [name] has refused to provide written consent for family/significant other to be provided Family/Significant Other Suicide Prevention Education during admission and/or prior to discharge.  Physician notified.  Debbrah Alar 08/03/2014, 12:52 PM

## 2014-08-03 NOTE — ED Provider Notes (Signed)
CSN: [id number]     Arrival date & time 08/03/14  0849 History   First MD Initiated Contact with Patient 08/03/14 [phone]     Chief Complaint  Patient presents with  . Medical Clearance  . Suicidal  . Cocaine Detox       HPI  Patient presents for evaluation of depression. Has had thoughts of hurting yourself cutting himself. States she has history of cocaine use. States she "relapsed" over the last several days. States that she is going through separation and divorce. She has children. It difficult custody battle. Lost her job. Under a lot of social stressors. Presents here for evaluation. Was seen and screened at behavioral health and has a bed available. Here for medical clearance. Reports no significant ongoing medical disorders.  Past Medical History  Diagnosis Date  . Bipolar 1 disorder   . Depression   . PTSD (post-traumatic stress disorder)   . ADD (attention deficit disorder)   . ADHD (attention deficit hyperactivity disorder)   . Bipolar depression   . Arthritis   . Degenerative disc disease    History reviewed. No pertinent past surgical history. Family History  Problem Relation Age of Onset  . Cancer Father    History  Substance Use Topics  . Smoking status: Current Every Day Smoker -- 0.50 packs/day for 17 years    Types: Cigarettes  . Smokeless tobacco: Never Used     Comment: last use 1.5 years ago.  . Alcohol Use: Yes     Comment: occasionally   OB History    No data available     Review of Systems  Constitutional: Negative for fever, chills, diaphoresis, appetite change and fatigue.  HENT: Negative for mouth sores, sore throat and trouble swallowing.   Eyes: Negative for visual disturbance.  Respiratory: Negative for cough, chest tightness, shortness of breath and wheezing.   Cardiovascular: Negative for chest pain.  Gastrointestinal: Negative for nausea, vomiting, abdominal pain, diarrhea and abdominal distention.  Endocrine: Negative for polydipsia,  polyphagia and polyuria.  Genitourinary: Negative for dysuria, frequency and hematuria.  Musculoskeletal: Negative for gait problem.  Skin: Negative for color change, pallor and rash.  Neurological: Negative for dizziness, syncope, light-headedness and headaches.  Hematological: Does not bruise/bleed easily.  Psychiatric/Behavioral: Positive for suicidal ideas. Negative for behavioral problems and confusion.      Allergies  Prednisone  Home Medications   Prior to Admission medications   Medication Sig Start Date End Date Taking? Authorizing Provider  amphetamine-dextroamphetamine (ADDERALL XR) 30 MG 24 hr capsule Take 30 mg by mouth daily.   Yes Historical Provider, MD  citalopram (CELEXA) 20 MG tablet Take 1 tablet (20 mg total) by mouth daily. For depression 06/22/14  Yes Encarnacion Slates, NP  lamoTRIgine (LAMICTAL) 25 MG tablet Take 1 tablet (25 mg total) by mouth 2 (two) times daily. Mood stabilization 06/22/14  Yes Encarnacion Slates, NP  traZODone (DESYREL) 50 MG tablet Take 1 tablet (50 mg total) by mouth at bedtime as needed for sleep. 06/22/14  Yes Encarnacion Slates, NP   BP 130/89 mmHg  Pulse 99  Temp(Src) 98 F (36.7 C) (Oral)  Resp 18  SpO2 100% Physical Exam  Constitutional: She is oriented to person, place, and time. She appears well-developed and well-nourished. No distress.  HENT:  Head: Normocephalic.  Eyes: Conjunctivae are normal. Pupils are equal, round, and reactive to light. No scleral icterus.  Neck: Normal range of motion. Neck supple. No thyromegaly present.  Cardiovascular: Normal  rate and regular rhythm.  Exam reveals no gallop and no friction rub.   No murmur heard. Pulmonary/Chest: Effort normal and breath sounds normal. No respiratory distress. She has no wheezes. She has no rales.  Abdominal: Soft. Bowel sounds are normal. She exhibits no distension. There is no tenderness. There is no rebound.  Musculoskeletal: Normal range of motion.  Neurological: She is  alert and oriented to person, place, and time.  Skin: Skin is warm and dry. No rash noted.  Psychiatric: She has a normal mood and affect. Her behavior is normal.    ED Course  Procedures (including critical care time) Labs Review Labs Reviewed  ACETAMINOPHEN LEVEL - Abnormal; Notable for the following:    Acetaminophen (Tylenol), Serum <10 (*)    All other components within normal limits  CBC - Abnormal; Notable for the following:    WBC 11.2 (*)    RBC 5.41 (*)    All other components within normal limits  COMPREHENSIVE METABOLIC PANEL - Abnormal; Notable for the following:    Glucose, Bld 106 (*)    All other components within normal limits  URINE RAPID DRUG SCREEN, HOSP PERFORMED - Abnormal; Notable for the following:    Cocaine POSITIVE (*)    Benzodiazepines POSITIVE (*)    Amphetamines POSITIVE (*)    Tetrahydrocannabinol POSITIVE (*)    All other components within normal limits  ETHANOL  SALICYLATE LEVEL  PREGNANCY, URINE    Imaging Review No results found.   EKG Interpretation None      MDM   Final diagnoses:  Depression    She medically clear here. No abnormalities on labs other than positive toxicology's. Patient has inpatient bed at behavioral health hospital.    Tanna Furry, MD 08/03/14 1028

## 2014-08-04 DIAGNOSIS — F1994 Other psychoactive substance use, unspecified with psychoactive substance-induced mood disorder: Secondary | ICD-10-CM | POA: Diagnosis not present

## 2014-08-04 DIAGNOSIS — F1414 Cocaine abuse with cocaine-induced mood disorder: Secondary | ICD-10-CM | POA: Diagnosis not present

## 2014-08-04 MED ORDER — CITALOPRAM HYDROBROMIDE 20 MG PO TABS: 20.0000 mg | ORAL_TABLET | Freq: Every day | ORAL | Status: DC

## 2014-08-04 MED ORDER — GUAIFENESIN ER 600 MG PO TB12: 1200.0000 mg | ORAL_TABLET | Freq: Two times a day (BID) | ORAL | Status: DC

## 2014-08-04 MED ORDER — LAMOTRIGINE 25 MG PO TABS: 25.0000 mg | ORAL_TABLET | Freq: Two times a day (BID) | ORAL | Status: DC

## 2014-08-04 MED ORDER — GABAPENTIN 300 MG PO CAPS: 300.0000 mg | ORAL_CAPSULE | Freq: Three times a day (TID) | ORAL | Status: DC

## 2014-08-04 MED ORDER — HYDROXYZINE HCL 25 MG PO TABS: 25.0000 mg | ORAL_TABLET | Freq: Four times a day (QID) | ORAL | Status: DC | PRN

## 2014-08-04 MED ORDER — TRAZODONE HCL 50 MG PO TABS: 50.0000 mg | ORAL_TABLET | Freq: Every evening | ORAL | Status: DC | PRN

## 2014-08-04 NOTE — Progress Notes (Signed)
Patient ID: [name], female   DOB: [birth year], 34 y.o.   MRN: [id number] Patient has been spending most of her time on unit sleeping. Less tearful today. Has not indicated that she is having SI but if asked will state that she may think about suicide if she is discharged and does no t have a program to go to. Stated she is not suicidal at this moment. Affect depressed at times but improved since admission. Denies HI and AVH.

## 2014-08-04 NOTE — BHH Counselor (Signed)
[facility] Assessment Progress Note  Counselor t/c to [facility] in [place] and made an assessment appt for pt for 7/5th @ 330pm for OP therapy with Christie Beckers. Christie Beckers indicated that they don't have a psychiatry appt until September and pt would have to continue w/ [facility] for psychiatry until that time.   Counselor advised pt of this info, including that she was declined at [facility], due to her hx of seizures. Pt appeared upset and unhappy with this news and disclosed that she hardly had gas in her car to get back home and didn't know how she would get there. Counselor informed pt that something would be worked out to get her back home and she appeared to be a little more accepting of the news.   Kenna Gilbert. Lovena Le, [place], [place] Disposition Counselor

## 2014-08-04 NOTE — BHH Counselor (Addendum)
[facility] Assessment Progress Note  Counselor rec'd t/c from Simonne Martinet at [facility] concerning referral sent for pt. She was concerned about pt indicating SI and having hx of seizures. She requested that counselor obtain additional information regarding those concerns.   Counselor spoke to pt about concerns from [facility]. Pt shared that she began to have seizures when she was 25 and was told it was most likely a side effect of withdrawing from antidepressants, namely Wellbutrin. She indicated that she has had about 6 seizures in her lifetime and last had one over a year ago. She reported that none of the seizures were severe enough to require hospitalization.  Concerning her SI, pt indicated that she did not currently have SI, but if she was d/c with nowhere to go, she would not be able to help having "urges" to have SI due to the anxiety of everything. Pt shared that she has a hx of cutting, but just for relief and never in an attempt to end her life. She reported never having SA.   Counselor relayed information given by pt to Levada Dy at [facility] via detailed VM message.   Kenna Gilbert. Lovena Le, MS, [place] Disposition Counselor      Counselor rec'd call back from [facility] who declined accepting pt due to hx of seizures.
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
## 2014-08-04 NOTE — Discharge Instructions (Addendum)
For you continued mental health needs, you have an assessment appointment for outpatient therapy scheduled on Tuesday, August 10, 2014 at 3:30 pm with the below provider:  [facility] [address], [address] [place], [place] [address] [phone] Please arrive by 3:00 pm to complete all necessary paperwork.   [facility] will not have an available appointment for psychiatry until September. Due to this, it is recommended that you continue with your current provider listed below for psychiatry needs until that time:  [facility] [address], [place] [address] [phone]

## 2014-08-04 NOTE — Progress Notes (Signed)
Patient upset that she has not been accepted anywhere. Stated "I came here for help and I ain't getting no help". Stated that her expectation had been to be admitted or sent somewhere long term. Stated "I'm paying this hospital $2000 to be in here and I should get helped". Then mumbled something "...Marland KitchenMarland KitchenMarland Kitchenwhen I kill myself in the parking lot". Patient has been on phone with husband cussing and arguing. List of additional resources to be provided.
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
## 2014-10-06 ENCOUNTER — Emergency Department (HOSPITAL_COMMUNITY)
Admission: EM | Admit: 2014-10-06 | Discharge: 2014-10-06 | Disposition: A | Discharge status: Home / Self Care | Payer: Medicaid Other | Attending: Emergency Medicine | Admitting: Emergency Medicine

## 2014-10-06 ENCOUNTER — Emergency Department (HOSPITAL_COMMUNITY): Payer: Medicaid Other

## 2014-10-06 ENCOUNTER — Encounter (HOSPITAL_COMMUNITY): Payer: Self-pay | Admitting: Emergency Medicine

## 2014-10-06 DIAGNOSIS — L02512 Cutaneous abscess of left hand: Secondary | ICD-10-CM | POA: Diagnosis present

## 2014-10-06 DIAGNOSIS — Z720 Tobacco use: Secondary | ICD-10-CM | POA: Diagnosis not present

## 2014-10-06 DIAGNOSIS — Z79899 Other long term (current) drug therapy: Secondary | ICD-10-CM | POA: Insufficient documentation

## 2014-10-06 DIAGNOSIS — Z8739 Personal history of other diseases of the musculoskeletal system and connective tissue: Secondary | ICD-10-CM | POA: Insufficient documentation

## 2014-10-06 DIAGNOSIS — F319 Bipolar disorder, unspecified: Secondary | ICD-10-CM | POA: Diagnosis not present

## 2014-10-06 DIAGNOSIS — R1110 Vomiting, unspecified: Secondary | ICD-10-CM

## 2014-10-06 DIAGNOSIS — R197 Diarrhea, unspecified: Secondary | ICD-10-CM

## 2014-10-06 DIAGNOSIS — L02419 Cutaneous abscess of limb, unspecified: Secondary | ICD-10-CM

## 2014-10-06 DIAGNOSIS — F4310 Post-traumatic stress disorder, unspecified: Secondary | ICD-10-CM | POA: Insufficient documentation

## 2014-10-06 LAB — CBC WITH DIFFERENTIAL/PLATELET
Basophils Absolute: 0 10*3/uL (ref 0.0–0.1)
Basophils Relative: 0 % (ref 0–1)
EOS ABS: 0.3 10*3/uL (ref 0.0–0.7)
Eosinophils Relative: 4 % (ref 0–5)
HEMATOCRIT: 43 % (ref 36.0–46.0)
HEMOGLOBIN: 14.1 g/dL (ref 12.0–15.0)
LYMPHS ABS: 1.9 10*3/uL (ref 0.7–4.0)
Lymphocytes Relative: 28 % (ref 12–46)
MCH: 27.5 pg (ref 26.0–34.0)
MCHC: 32.8 g/dL (ref 30.0–36.0)
MCV: 83.8 fL (ref 78.0–100.0)
Monocytes Absolute: 0.9 10*3/uL (ref 0.1–1.0)
Monocytes Relative: 13 % — ABNORMAL HIGH (ref 3–12)
NEUTROS ABS: 3.9 10*3/uL (ref 1.7–7.7)
NEUTROS PCT: 55 % (ref 43–77)
Platelets: 297 10*3/uL (ref 150–400)
RBC: 5.13 MIL/uL — AB (ref 3.87–5.11)
RDW: 13.6 % (ref 11.5–15.5)
WBC: 7 10*3/uL (ref 4.0–10.5)

## 2014-10-06 LAB — BASIC METABOLIC PANEL
ANION GAP: 7 (ref 5–15)
BUN: 14 mg/dL (ref 6–20)
CHLORIDE: 107 mmol/L (ref 101–111)
CO2: 23 mmol/L (ref 22–32)
CREATININE: 0.67 mg/dL (ref 0.44–1.00)
Calcium: 8.9 mg/dL (ref 8.9–10.3)
GFR calc Af Amer: >60 mL/min (ref >60)
GFR calc non Af Amer: >60 mL/min (ref >60)
Glucose, Bld: 94 mg/dL (ref 65–99)
Potassium: 4.2 mmol/L (ref 3.5–5.1)
SODIUM: 137 mmol/L (ref 135–145)

## 2014-10-06 MED ORDER — KETOROLAC TROMETHAMINE 30 MG/ML IJ SOLN
30.0000 mg | Freq: Once | INTRAMUSCULAR | Status: AC
Start: 2014-10-06 — End: 2014-10-06
  Administered 2014-10-06: 30 mg via INTRAVENOUS
  Filled 2014-10-06: qty 1

## 2014-10-06 MED ORDER — BUPIVACAINE HCL (PF) 0.5 % IJ SOLN
10.0000 mL | Freq: Once | INTRAMUSCULAR | Status: AC
  Administered 2014-10-06: 10 mL
  Filled 2014-10-06: qty 30

## 2014-10-06 MED ORDER — TRAMADOL HCL 50 MG PO TABS: 50.0000 mg | ORAL_TABLET | Freq: Four times a day (QID) | ORAL | Status: DC | PRN

## 2014-10-06 MED ORDER — SODIUM CHLORIDE 0.9 % IV BOLUS (SEPSIS)
1000.0000 mL | Freq: Once | INTRAVENOUS | Status: AC
  Administered 2014-10-06: 1000 mL via INTRAVENOUS

## 2014-10-06 MED ORDER — SODIUM CHLORIDE 0.9 % IV SOLN
INTRAVENOUS | Status: DC
  Administered 2014-10-06: 07:00:00 via INTRAVENOUS

## 2014-10-06 MED ORDER — CLINDAMYCIN PHOSPHATE 600 MG/50ML IV SOLN
600.0000 mg | Freq: Once | INTRAVENOUS | Status: AC
  Administered 2014-10-06: 600 mg via INTRAVENOUS
  Filled 2014-10-06: qty 50

## 2014-10-06 MED ORDER — CLINDAMYCIN HCL 300 MG PO CAPS: 300.0000 mg | ORAL_CAPSULE | Freq: Four times a day (QID) | ORAL | Status: DC

## 2014-10-06 NOTE — ED Notes (Signed)
Covered patient's wound with 4x4 and wrapped in sterile gauze. Patient tolerated well.

## 2014-10-06 NOTE — ED Provider Notes (Addendum)
Care assumed from Dr. Tomi Bamberger at shift change. Patient presents with swelling to her left antecubital region she states resulted from animal scratches when she broke up a fight between a cat and dog while at work. This appears to be an abscess which was incised and drained as below.  INCISION AND DRAINAGE Performed by: Veryl Speak Consent: Verbal consent obtained. Risks and benefits: risks, benefits and alternatives were discussed Type: abscess  Body area: Left antecubital  Anesthesia: local infiltration  Incision was made with a scalpel.  Local anesthetic: lidocaine 1 % without epinephrine  Anesthetic total: 2 ml  Complexity: complex Blunt dissection to break up loculations  Drainage: purulent  Drainage amount: Moderate   Packing material: No packing placed   Patient tolerance: Patient tolerated the procedure well with no immediate complications.   She was given IV clindamycin and will be discharged with by mouth clindamycin, warm soaks, and when necessary return. I suspect this patient is likely using IV drugs, however does not admit to this. She has areas to her right antecubital region and dorsum of the hand that are consistent with needle marks.      Veryl Speak, MD 10/06/14 1848  Veryl Speak, MD 10/06/14 [phone]

## 2014-10-06 NOTE — ED Provider Notes (Signed)
CSN: [id number]     Arrival date & time 10/06/14  0508 History   First MD Initiated Contact with Patient 10/06/14 0630   Chief Complaint  Patient presents with  . Abscess     (Consider location/radiation/quality/duration/timing/severity/associated sxs/prior Treatment) HPI  Patient states she works in [employer], [employer]. She states a dog and a cat got into a fight on Sunday, August 28 and she states the cat bit her on the dorsum of her left hand and the dog nipped her in her right forearm. She states both animals have been adopted since that time. They had unknown rabies prior to this event. She states last night she started having fevers that are undocumented, chills, nausea and vomiting about 4 times over the past few days. She is having diarrhea about 3 times a day that is loose and watery.  She denies chest pain, but has shortness of breath.   [facility]  Past Medical History  Diagnosis Date  . Bipolar 1 disorder   . Depression   . PTSD (post-traumatic stress disorder)   . ADD (attention deficit disorder)   . ADHD (attention deficit hyperactivity disorder)   . Bipolar depression   . Arthritis   . Degenerative disc disease    History reviewed. No pertinent past surgical history. Family History  Problem Relation Age of Onset  . Cancer Father    Social History  Substance Use Topics  . Smoking status: Current Every Day Smoker -- 0.50 packs/day for 17 years    Types: Cigarettes  . Smokeless tobacco: Never Used     Comment: last use 1.5 years ago.  . Alcohol Use: Yes     Comment: occasionally   Lives with husband States last cocaine was 6-12 months ago  OB History    No data available     Review of Systems  All other systems reviewed and are negative.     Allergies  Prednisone  Home Medications   Prior to Admission medications   Medication Sig Start Date End Date Taking? Authorizing Provider  gabapentin (NEURONTIN) 300 MG capsule Take 1  capsule (300 mg total) by mouth 3 (three) times daily. 08/04/14  Yes Benjamine Mola, FNP  lamoTRIgine (LAMICTAL) 25 MG tablet Take 1 tablet (25 mg total) by mouth 2 (two) times daily. Mood stabilization 08/04/14  Yes Benjamine Mola, FNP  PARoxetine (PAXIL) 40 MG tablet Take 40 mg by mouth every morning.   Yes Historical Provider, MD  traZODone (DESYREL) 50 MG tablet Take 1 tablet (50 mg total) by mouth at bedtime as needed for sleep. 08/04/14  Yes Benjamine Mola, FNP  citalopram (CELEXA) 20 MG tablet Take 1 tablet (20 mg total) by mouth daily. For depression 08/04/14   Benjamine Mola, FNP  guaiFENesin (MUCINEX) 600 MG 12 hr tablet Take 2 tablets (1,200 mg total) by mouth 2 (two) times daily. 08/04/14   Benjamine Mola, FNP  hydrOXYzine (ATARAX/VISTARIL) 25 MG tablet Take 1 tablet (25 mg total) by mouth every 6 (six) hours as needed for anxiety. 08/04/14   Elyse Jarvis Withrow, FNP   BP 119/62 mmHg  Pulse 77  Temp(Src) 97.8 F (36.6 C) (Oral)  Resp 20  SpO2 100%  LMP 09/06/2014  Vital signs normal   Physical Exam  Constitutional: She is oriented to person, place, and time. She appears well-developed and well-nourished.  Non-toxic appearance. She does not appear ill. No distress.  HENT:  Head: Normocephalic and atraumatic.  Right Ear: External ear normal.  Left Ear: External ear normal.  Nose: Nose normal. No mucosal edema or rhinorrhea.  Mouth/Throat: Mucous membranes are normal. No dental abscesses or uvula swelling.  Eyes: Conjunctivae and EOM are normal. Pupils are equal, round, and reactive to light.  Neck: Normal range of motion and full passive range of motion without pain. Neck supple.  Cardiovascular: Normal rate, regular rhythm and normal heart sounds.  Exam reveals no gallop and no friction rub.   No murmur heard. Pulmonary/Chest: Effort normal and breath sounds normal. No respiratory distress. She has no wheezes. She has no rhonchi. She has no rales. She exhibits no tenderness and no  crepitus.  Abdominal: Soft. Normal appearance and bowel sounds are normal. She exhibits no distension. There is no tenderness. There is no rebound and no guarding.  Musculoskeletal: Normal range of motion. She exhibits no edema or tenderness.  Moves all extremities well.   Neurological: She is alert and oriented to person, place, and time. She has normal strength. No cranial nerve deficit.  Skin: Skin is warm, dry and intact. No rash noted. No erythema. No pallor.  Patient has a large red raised area in her left medial antecubital area that is very tender to palpation. She shows me a small dot on the dorsum of her left hand that she states was a cat bite. She has 3 small puncture type scabbed areas that are coincidentally over veins on the dorsum of her left hand. Patient is noted to have a bruised area in her right antecubital area.  Psychiatric: She has a normal mood and affect. Her speech is normal and behavior is normal. Her mood appears not anxious.  Nursing note and vitals reviewed.          ED Course  Procedures (including critical care time)  Medications  0.9 %  sodium chloride infusion ( Intravenous New Bag/Given 10/06/14 0707)  clindamycin (CLEOCIN) IVPB 600 mg (not administered)  sodium chloride 0.9 % bolus 1,000 mL (not administered)  bupivacaine (MARCAINE) 0.5 % injection 10 mL (10 mLs Infiltration Given by Other 10/06/14 0708)  ketorolac (TORADOL) 30 MG/ML injection 30 mg (30 mg Intravenous Given 10/06/14 0707)    Pt has marks on the dorsum of her left hand and her right antecubital area that are highly suspicious for IV injection sites. Patient was turned over to Dr. Stark Jock at change of shift for treatment and disposition.           Labs Review   Results for orders placed or performed during the hospital encounter of 84/16/60  Basic metabolic panel  Result Value Ref Range   Sodium 137 135 - 145 mmol/L   Potassium 4.2 3.5 - 5.1 mmol/L   Chloride 107 101 - 111 mmol/L     CO2 23 22 - 32 mmol/L   Glucose, Bld 94 65 - 99 mg/dL   BUN 14 6 - 20 mg/dL   Creatinine, Ser 0.67 0.44 - 1.00 mg/dL   Calcium 8.9 8.9 - 10.3 mg/dL   GFR calc non Af Amer >60 >60 mL/min   GFR calc Af Amer >60 >60 mL/min   Anion gap 7 5 - 15  CBC with Differential  Result Value Ref Range   WBC 7.0 4.0 - 10.5 K/uL   RBC 5.13 (H) 3.87 - 5.11 MIL/uL   Hemoglobin 14.1 12.0 - 15.0 g/dL   HCT 43.0 36.0 - 46.0 %   MCV 83.8 78.0 - 100.0 fL   MCH  27.5 26.0 - 34.0 pg   MCHC 32.8 30.0 - 36.0 g/dL   RDW 13.6 11.5 - 15.5 %   Platelets 297 150 - 400 K/uL   Neutrophils Relative % 55 43 - 77 %   Neutro Abs 3.9 1.7 - 7.7 K/uL   Lymphocytes Relative 28 12 - 46 %   Lymphs Abs 1.9 0.7 - 4.0 K/uL   Monocytes Relative 13 (H) 3 - 12 %   Monocytes Absolute 0.9 0.1 - 1.0 K/uL   Eosinophils Relative 4 0 - 5 %   Eosinophils Absolute 0.3 0.0 - 0.7 K/uL   Basophils Relative 0 0 - 1 %   Basophils Absolute 0.0 0.0 - 0.1 K/uL   Laboratory interpretation all normal      Results for orders placed or performed during the hospital encounter of 08/03/14  Acetaminophen level  Result Value Ref Range   Acetaminophen (Tylenol), Serum <10 (L) 10 - 30 ug/mL  CBC  Result Value Ref Range   WBC 11.2 (H) 4.0 - 10.5 K/uL   RBC 5.41 (H) 3.87 - 5.11 MIL/uL   Hemoglobin 14.4 12.0 - 15.0 g/dL   HCT 44.1 36.0 - 46.0 %   MCV 81.5 78.0 - 100.0 fL   MCH 26.6 26.0 - 34.0 pg   MCHC 32.7 30.0 - 36.0 g/dL   RDW 13.2 11.5 - 15.5 %   Platelets 349 150 - 400 K/uL  Comprehensive metabolic panel  Result Value Ref Range   Sodium 138 135 - 145 mmol/L   Potassium 3.6 3.5 - 5.1 mmol/L   Chloride 103 101 - 111 mmol/L   CO2 25 22 - 32 mmol/L   Glucose, Bld 106 (H) 65 - 99 mg/dL   BUN 19 6 - 20 mg/dL   Creatinine, Ser 0.91 0.44 - 1.00 mg/dL   Calcium 9.7 8.9 - 10.3 mg/dL   Total Protein 7.5 6.5 - 8.1 g/dL   Albumin 4.5 3.5 - 5.0 g/dL   AST 19 15 - 41 U/L   ALT 16 14 - 54 U/L   Alkaline Phosphatase 59 38 - 126 U/L   Total  Bilirubin 0.7 0.3 - 1.2 mg/dL   GFR calc non Af Amer >60 >60 mL/min   GFR calc Af Amer >60 >60 mL/min   Anion gap 10 5 - 15  Ethanol (ETOH)  Result Value Ref Range   Alcohol, Ethyl (B) <5 <5 mg/dL  Salicylate level  Result Value Ref Range   Salicylate Lvl <2.5 2.8 - 30.0 mg/dL  Urine rapid drug screen (hosp performed)not at [facility]  Result Value Ref Range   Opiates NONE DETECTED NONE DETECTED   Cocaine POSITIVE (A) NONE DETECTED   Benzodiazepines POSITIVE (A) NONE DETECTED   Amphetamines POSITIVE (A) NONE DETECTED   Tetrahydrocannabinol POSITIVE (A) NONE DETECTED   Barbiturates NONE DETECTED NONE DETECTED  Pregnancy, urine  Result Value Ref Range   Preg Test, Ur NEGATIVE NEGATIVE    Imaging Review No results found. I have personally reviewed and evaluated these images and lab results as part of my medical decision-making.   EKG Interpretation None      MDM   Final diagnoses:  Abscess of antecubital fossa  Vomiting and diarrhea    Disposition pending  Rolland Porter, MD, Barbette Or, MD 10/06/14 [phone]

## 2014-10-06 NOTE — Discharge Instructions (Signed)
Clindamycin as prescribed.  Tramadol prescribed as needed for pain.  Apply warm soaks to the area as frequently as possible for the next 2-3 days.  Return to the emergency department if symptoms significantly worsen or change.   Abscess An abscess is an infected area that contains a collection of pus and debris.It can occur in almost any part of the body. An abscess is also known as a furuncle or boil. CAUSES  An abscess occurs when tissue gets infected. This can occur from blockage of oil or sweat glands, infection of hair follicles, or a minor injury to the skin. As the body tries to fight the infection, pus collects in the area and creates pressure under the skin. This pressure causes pain. People with weakened immune systems have difficulty fighting infections and get certain abscesses more often.  SYMPTOMS Usually an abscess develops on the skin and becomes a painful mass that is red, warm, and tender. If the abscess forms under the skin, you may feel a moveable soft area under the skin. Some abscesses break open (rupture) on their own, but most will continue to get worse without care. The infection can spread deeper into the body and eventually into the bloodstream, causing you to feel ill.  DIAGNOSIS  Your caregiver will take your medical history and perform a physical exam. A sample of fluid may also be taken from the abscess to determine what is causing your infection. TREATMENT  Your caregiver may prescribe antibiotic medicines to fight the infection. However, taking antibiotics alone usually does not cure an abscess. Your caregiver may need to make a small cut (incision) in the abscess to drain the pus. In some cases, gauze is packed into the abscess to reduce pain and to continue draining the area. HOME CARE INSTRUCTIONS   Only take over-the-counter or prescription medicines for pain, discomfort, or fever as directed by your caregiver.  If you were prescribed antibiotics, take  them as directed. Finish them even if you start to feel better.  If gauze is used, follow your caregiver's directions for changing the gauze.  To avoid spreading the infection:  Keep your draining abscess covered with a bandage.  Wash your hands well.  Do not share personal care items, towels, or whirlpools with others.  Avoid skin contact with others.  Keep your skin and clothes clean around the abscess.  Keep all follow-up appointments as directed by your caregiver. SEEK MEDICAL CARE IF:   You have increased pain, swelling, redness, fluid drainage, or bleeding.  You have muscle aches, chills, or a general ill feeling.  You have a fever. MAKE SURE YOU:   Understand these instructions.  Will watch your condition.  Will get help right away if you are not doing well or get worse. Document Released: 11/01/2004 Document Revised: 07/24/2011 Document Reviewed: 04/06/2011 [facility] Patient Information 2015 [place], [place]. This information is not intended to replace advice given to you by your health care provider. Make sure you discuss any questions you have with your health care provider.

## 2014-10-06 NOTE — ED Notes (Signed)
Pt c/o left arm abscess since Sunday.

## 2014-10-06 NOTE — ED Notes (Addendum)
Patient requesting tylenol for pain. Advised Dr Stark Jock. Verbal order for tylenol given. Patient states "I don't want the tylenol, I'll just take some goody powders when I get home." Patient not given tylenol at this time.

## 2014-10-19 ENCOUNTER — Ambulatory Visit (HOSPITAL_COMMUNITY)
Admission: EM | Admit: 2014-10-19 | Discharge: 2014-10-19 | Disposition: A | Discharge status: Home / Self Care | Payer: No Typology Code available for payment source | Source: Ambulatory Visit | Attending: Emergency Medicine | Admitting: Emergency Medicine

## 2014-10-19 ENCOUNTER — Emergency Department (HOSPITAL_COMMUNITY)
Admission: EM | Admit: 2014-10-19 | Discharge: 2014-10-20 | Disposition: A | Discharge status: Other Acute Inpatient Hospital | Payer: Medicaid Other | Attending: Emergency Medicine | Admitting: Emergency Medicine

## 2014-10-19 ENCOUNTER — Encounter (HOSPITAL_COMMUNITY): Payer: Self-pay | Admitting: Emergency Medicine

## 2014-10-19 ENCOUNTER — Emergency Department (HOSPITAL_COMMUNITY)
Admission: EM | Admit: 2014-10-19 | Discharge: 2014-10-19 | Disposition: A | Discharge status: Home / Self Care | Payer: Medicaid Other | Attending: Emergency Medicine | Admitting: Emergency Medicine

## 2014-10-19 DIAGNOSIS — F1410 Cocaine abuse, uncomplicated: Secondary | ICD-10-CM | POA: Insufficient documentation

## 2014-10-19 DIAGNOSIS — F319 Bipolar disorder, unspecified: Secondary | ICD-10-CM | POA: Insufficient documentation

## 2014-10-19 DIAGNOSIS — Y9389 Activity, other specified: Secondary | ICD-10-CM | POA: Insufficient documentation

## 2014-10-19 DIAGNOSIS — Z0441 Encounter for examination and observation following alleged adult rape: Secondary | ICD-10-CM | POA: Insufficient documentation

## 2014-10-19 DIAGNOSIS — F17210 Nicotine dependence, cigarettes, uncomplicated: Secondary | ICD-10-CM | POA: Insufficient documentation

## 2014-10-19 DIAGNOSIS — R45851 Suicidal ideations: Secondary | ICD-10-CM

## 2014-10-19 DIAGNOSIS — S40021A Contusion of right upper arm, initial encounter: Secondary | ICD-10-CM | POA: Diagnosis not present

## 2014-10-19 DIAGNOSIS — F1310 Sedative, hypnotic or anxiolytic abuse, uncomplicated: Secondary | ICD-10-CM | POA: Insufficient documentation

## 2014-10-19 DIAGNOSIS — S40022A Contusion of left upper arm, initial encounter: Secondary | ICD-10-CM | POA: Diagnosis not present

## 2014-10-19 DIAGNOSIS — Z888 Allergy status to other drugs, medicaments and biological substances status: Secondary | ICD-10-CM | POA: Diagnosis not present

## 2014-10-19 DIAGNOSIS — F4310 Post-traumatic stress disorder, unspecified: Secondary | ICD-10-CM | POA: Insufficient documentation

## 2014-10-19 DIAGNOSIS — S8011XA Contusion of right lower leg, initial encounter: Secondary | ICD-10-CM | POA: Diagnosis not present

## 2014-10-19 DIAGNOSIS — F419 Anxiety disorder, unspecified: Secondary | ICD-10-CM | POA: Insufficient documentation

## 2014-10-19 DIAGNOSIS — Y998 Other external cause status: Secondary | ICD-10-CM | POA: Diagnosis not present

## 2014-10-19 DIAGNOSIS — S8012XA Contusion of left lower leg, initial encounter: Secondary | ICD-10-CM | POA: Diagnosis not present

## 2014-10-19 DIAGNOSIS — Z720 Tobacco use: Secondary | ICD-10-CM | POA: Insufficient documentation

## 2014-10-19 DIAGNOSIS — Z79899 Other long term (current) drug therapy: Secondary | ICD-10-CM | POA: Diagnosis not present

## 2014-10-19 DIAGNOSIS — Z8739 Personal history of other diseases of the musculoskeletal system and connective tissue: Secondary | ICD-10-CM | POA: Insufficient documentation

## 2014-10-19 DIAGNOSIS — F909 Attention-deficit hyperactivity disorder, unspecified type: Secondary | ICD-10-CM | POA: Insufficient documentation

## 2014-10-19 DIAGNOSIS — S4992XA Unspecified injury of left shoulder and upper arm, initial encounter: Secondary | ICD-10-CM | POA: Diagnosis present

## 2014-10-19 DIAGNOSIS — Z3202 Encounter for pregnancy test, result negative: Secondary | ICD-10-CM | POA: Diagnosis not present

## 2014-10-19 DIAGNOSIS — F1210 Cannabis abuse, uncomplicated: Secondary | ICD-10-CM | POA: Insufficient documentation

## 2014-10-19 DIAGNOSIS — F329 Major depressive disorder, single episode, unspecified: Secondary | ICD-10-CM | POA: Insufficient documentation

## 2014-10-19 DIAGNOSIS — T148XXA Other injury of unspecified body region, initial encounter: Secondary | ICD-10-CM

## 2014-10-19 DIAGNOSIS — M7981 Nontraumatic hematoma of soft tissue: Secondary | ICD-10-CM | POA: Insufficient documentation

## 2014-10-19 DIAGNOSIS — Z008 Encounter for other general examination: Secondary | ICD-10-CM | POA: Diagnosis present

## 2014-10-19 DIAGNOSIS — Y9289 Other specified places as the place of occurrence of the external cause: Secondary | ICD-10-CM | POA: Diagnosis not present

## 2014-10-19 DIAGNOSIS — F1510 Other stimulant abuse, uncomplicated: Secondary | ICD-10-CM | POA: Insufficient documentation

## 2014-10-19 LAB — RAPID URINE DRUG SCREEN, HOSP PERFORMED
Amphetamines: POSITIVE — AB
BENZODIAZEPINES: POSITIVE — AB
Barbiturates: NOT DETECTED
Cocaine: POSITIVE — AB
OPIATES: NOT DETECTED
Tetrahydrocannabinol: POSITIVE — AB

## 2014-10-19 LAB — POC URINE PREG, ED: Preg Test, Ur: NEGATIVE

## 2014-10-19 LAB — ETHANOL

## 2014-10-19 MED ORDER — CEFIXIME 400 MG PO TABS
400.0000 mg | ORAL_TABLET | Freq: Once | ORAL | Status: AC
  Administered 2014-10-19: 400 mg via ORAL
  Filled 2014-10-19: qty 1

## 2014-10-19 MED ORDER — IBUPROFEN 400 MG PO TABS
600.0000 mg | ORAL_TABLET | Freq: Once | ORAL | Status: AC
  Administered 2014-10-19: 600 mg via ORAL
  Filled 2014-10-19: qty 2

## 2014-10-19 MED ORDER — GI COCKTAIL ~~LOC~~
30.0000 mL | Freq: Once | ORAL | Status: AC
  Administered 2014-10-19: 30 mL via ORAL
  Filled 2014-10-19: qty 30

## 2014-10-19 MED ORDER — AZITHROMYCIN 250 MG PO TABS
1000.0000 mg | ORAL_TABLET | Freq: Once | ORAL | Status: AC
  Administered 2014-10-19: 1000 mg via ORAL
  Filled 2014-10-19: qty 4

## 2014-10-19 NOTE — ED Notes (Signed)
[employer] remain at bedside. Patient in no distress.

## 2014-10-19 NOTE — ED Notes (Signed)
Patient with no complaints at this time. Respirations even and unlabored. Skin warm/dry. Discharge instructions reviewed with patient at this time. Patient given opportunity to voice concerns/ask questions. Patient is waiting for SANE RN to return to room with fresh clothes for patient. Patient informed that when she is dressed she is discharged and free to leave.
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
## 2014-10-19 NOTE — ED Notes (Signed)
Patient left Emergency Department with steady gait.
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
## 2014-10-19 NOTE — ED Notes (Signed)
Pelvic cart provided for SANE RN.
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
## 2014-10-19 NOTE — ED Notes (Signed)
SANE RN still at bedside. Will d/c patient when she is done.
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
## 2014-10-19 NOTE — ED Notes (Signed)
[facility] at bedside.  

## 2014-10-19 NOTE — ED Notes (Signed)
SANE at bedside for exam.

## 2014-10-20 ENCOUNTER — Encounter (HOSPITAL_COMMUNITY): Payer: Self-pay | Admitting: *Deleted

## 2014-10-20 ENCOUNTER — Inpatient Hospital Stay (HOSPITAL_COMMUNITY)
Admission: EM | Admit: 2014-10-20 | Discharge: 2014-11-04 | DRG: 897 | Disposition: A | Discharge status: Home / Self Care | Payer: Medicaid Other | Source: Intra-hospital | Attending: Psychiatry | Admitting: Psychiatry

## 2014-10-20 DIAGNOSIS — R45851 Suicidal ideations: Secondary | ICD-10-CM | POA: Diagnosis present

## 2014-10-20 DIAGNOSIS — F1994 Other psychoactive substance use, unspecified with psychoactive substance-induced mood disorder: Secondary | ICD-10-CM | POA: Diagnosis present

## 2014-10-20 DIAGNOSIS — F319 Bipolar disorder, unspecified: Secondary | ICD-10-CM | POA: Diagnosis not present

## 2014-10-20 DIAGNOSIS — F17210 Nicotine dependence, cigarettes, uncomplicated: Secondary | ICD-10-CM | POA: Diagnosis present

## 2014-10-20 DIAGNOSIS — F1424 Cocaine dependence with cocaine-induced mood disorder: Secondary | ICD-10-CM | POA: Diagnosis present

## 2014-10-20 DIAGNOSIS — F1324 Sedative, hypnotic or anxiolytic dependence with sedative, hypnotic or anxiolytic-induced mood disorder: Secondary | ICD-10-CM | POA: Diagnosis present

## 2014-10-20 DIAGNOSIS — G4700 Insomnia, unspecified: Secondary | ICD-10-CM | POA: Diagnosis present

## 2014-10-20 DIAGNOSIS — F1524 Other stimulant dependence with stimulant-induced mood disorder: Secondary | ICD-10-CM | POA: Diagnosis present

## 2014-10-20 DIAGNOSIS — F1920 Other psychoactive substance dependence, uncomplicated: Secondary | ICD-10-CM

## 2014-10-20 DIAGNOSIS — F4310 Post-traumatic stress disorder, unspecified: Secondary | ICD-10-CM | POA: Diagnosis not present

## 2014-10-20 DIAGNOSIS — F12288 Cannabis dependence with other cannabis-induced disorder: Secondary | ICD-10-CM | POA: Diagnosis present

## 2014-10-20 DIAGNOSIS — F515 Nightmare disorder: Secondary | ICD-10-CM | POA: Diagnosis present

## 2014-10-20 DIAGNOSIS — F1120 Opioid dependence, uncomplicated: Secondary | ICD-10-CM | POA: Diagnosis not present

## 2014-10-20 DIAGNOSIS — F909 Attention-deficit hyperactivity disorder, unspecified type: Secondary | ICD-10-CM | POA: Diagnosis not present

## 2014-10-20 DIAGNOSIS — F419 Anxiety disorder, unspecified: Secondary | ICD-10-CM | POA: Diagnosis present

## 2014-10-20 DIAGNOSIS — F329 Major depressive disorder, single episode, unspecified: Secondary | ICD-10-CM | POA: Diagnosis present

## 2014-10-20 DIAGNOSIS — F1124 Opioid dependence with opioid-induced mood disorder: Principal | ICD-10-CM | POA: Diagnosis present

## 2014-10-20 DIAGNOSIS — F11229 Opioid dependence with intoxication, unspecified: Secondary | ICD-10-CM | POA: Diagnosis not present

## 2014-10-20 LAB — CBC WITH DIFFERENTIAL/PLATELET
Basophils Absolute: 0 10*3/uL (ref 0.0–0.1)
Basophils Relative: 0 %
Eosinophils Absolute: 0.1 10*3/uL (ref 0.0–0.7)
Eosinophils Relative: 1 %
HCT: 41.7 % (ref 36.0–46.0)
HEMOGLOBIN: 14.1 g/dL (ref 12.0–15.0)
LYMPHS ABS: 1.8 10*3/uL (ref 0.7–4.0)
Lymphocytes Relative: 20 %
MCH: 27 pg (ref 26.0–34.0)
MCHC: 33.8 g/dL (ref 30.0–36.0)
MCV: 79.9 fL (ref 78.0–100.0)
MONOS PCT: 12 %
Monocytes Absolute: 1.1 10*3/uL — ABNORMAL HIGH (ref 0.1–1.0)
NEUTROS PCT: 67 %
Neutro Abs: 6.2 10*3/uL (ref 1.7–7.7)
Platelets: 416 10*3/uL — ABNORMAL HIGH (ref 150–400)
RBC: 5.22 MIL/uL — AB (ref 3.87–5.11)
RDW: 12.7 % (ref 11.5–15.5)
WBC: 9.2 10*3/uL (ref 4.0–10.5)

## 2014-10-20 LAB — COMPREHENSIVE METABOLIC PANEL
ALK PHOS: 58 U/L (ref 38–126)
ALT: 21 U/L (ref 14–54)
AST: 30 U/L (ref 15–41)
Albumin: 4.7 g/dL (ref 3.5–5.0)
Anion gap: 7 (ref 5–15)
BILIRUBIN TOTAL: 1 mg/dL (ref 0.3–1.2)
BUN: 23 mg/dL — ABNORMAL HIGH (ref 6–20)
CALCIUM: 9.4 mg/dL (ref 8.9–10.3)
CO2: 26 mmol/L (ref 22–32)
CREATININE: 0.8 mg/dL (ref 0.44–1.00)
Chloride: 105 mmol/L (ref 101–111)
GFR calc Af Amer: >60 mL/min (ref >60)
Glucose, Bld: 98 mg/dL (ref 65–99)
Potassium: 3.1 mmol/L — ABNORMAL LOW (ref 3.5–5.1)
Sodium: 138 mmol/L (ref 135–145)
TOTAL PROTEIN: 7.5 g/dL (ref 6.5–8.1)

## 2014-10-20 MED ORDER — HYDROXYZINE HCL 50 MG PO TABS
50.0000 mg | ORAL_TABLET | Freq: Four times a day (QID) | ORAL | Status: DC | PRN
  Administered 2014-10-20 – 2014-11-04 (×18): 50 mg via ORAL
  Filled 2014-10-20 (×18): qty 1

## 2014-10-20 MED ORDER — GABAPENTIN 300 MG PO CAPS
300.0000 mg | ORAL_CAPSULE | Freq: Three times a day (TID) | ORAL | Status: DC
  Administered 2014-10-20 – 2014-10-27 (×22): 300 mg via ORAL
  Filled 2014-10-20 (×25): qty 1

## 2014-10-20 MED ORDER — POTASSIUM CHLORIDE CRYS ER 20 MEQ PO TBCR
20.0000 meq | EXTENDED_RELEASE_TABLET | Freq: Two times a day (BID) | ORAL | Status: AC
  Administered 2014-10-20 – 2014-10-21 (×4): 20 meq via ORAL
  Filled 2014-10-20 (×4): qty 1

## 2014-10-20 MED ORDER — CITALOPRAM HYDROBROMIDE 20 MG PO TABS
20.0000 mg | ORAL_TABLET | Freq: Every day | ORAL | Status: DC
  Administered 2014-10-21: 20 mg via ORAL
  Filled 2014-10-20 (×4): qty 1

## 2014-10-20 MED ORDER — NICOTINE 21 MG/24HR TD PT24
21.0000 mg | MEDICATED_PATCH | Freq: Every day | TRANSDERMAL | Status: DC
  Administered 2014-10-20 – 2014-10-25 (×6): 21 mg via TRANSDERMAL
  Filled 2014-10-20 (×8): qty 1

## 2014-10-20 MED ORDER — LAMOTRIGINE 25 MG PO TABS
25.0000 mg | ORAL_TABLET | Freq: Two times a day (BID) | ORAL | Status: DC
  Administered 2014-10-21: 25 mg via ORAL
  Filled 2014-10-20 (×7): qty 1

## 2014-10-20 MED ORDER — POTASSIUM CHLORIDE CRYS ER 20 MEQ PO TBCR
40.0000 meq | EXTENDED_RELEASE_TABLET | Freq: Once | ORAL | Status: AC
  Administered 2014-10-20: 40 meq via ORAL
  Filled 2014-10-20: qty 2

## 2014-10-20 MED ORDER — MAGIC MOUTHWASH
5.0000 mL | Freq: Four times a day (QID) | ORAL | Status: DC | PRN
  Administered 2014-10-20 – 2014-10-22 (×4): 5 mL via ORAL
  Filled 2014-10-20 (×5): qty 5

## 2014-10-20 MED ORDER — LIDOCAINE VISCOUS 2 % MT SOLN
15.0000 mL | Freq: Once | OROMUCOSAL | Status: AC
  Administered 2014-10-20: 15 mL via OROMUCOSAL
  Filled 2014-10-20: qty 15

## 2014-10-20 MED ORDER — ACETAMINOPHEN 325 MG PO TABS
650.0000 mg | ORAL_TABLET | Freq: Four times a day (QID) | ORAL | Status: DC | PRN
  Administered 2014-10-20: 650 mg via ORAL
  Filled 2014-10-20: qty 2

## 2014-10-20 MED ORDER — ALUM & MAG HYDROXIDE-SIMETH 200-200-20 MG/5ML PO SUSP: 30.0000 mL | ORAL | Status: DC | PRN

## 2014-10-20 MED ORDER — MAGNESIUM HYDROXIDE 400 MG/5ML PO SUSP: 30.0000 mL | Freq: Every day | ORAL | Status: DC | PRN

## 2014-10-20 MED ORDER — TRAZODONE HCL 50 MG PO TABS
50.0000 mg | ORAL_TABLET | Freq: Every evening | ORAL | Status: DC | PRN
  Administered 2014-10-20 – 2014-10-25 (×6): 50 mg via ORAL
  Filled 2014-10-20 (×6): qty 1

## 2014-10-20 MED ORDER — TRAMADOL HCL 50 MG PO TABS
50.0000 mg | ORAL_TABLET | Freq: Four times a day (QID) | ORAL | Status: DC | PRN
  Administered 2014-10-20 – 2014-11-03 (×5): 50 mg via ORAL
  Filled 2014-10-20 (×6): qty 1

## 2014-10-20 MED ORDER — HYDROXYZINE HCL 25 MG PO TABS: 25.0000 mg | ORAL_TABLET | Freq: Four times a day (QID) | ORAL | Status: DC | PRN

## 2014-10-20 NOTE — ED Notes (Signed)
[employer] notified. 

## 2014-10-20 NOTE — ED Provider Notes (Signed)
1:20 AM  Discussed with [name] at [facility]. Pt here with suicidal thoughts. Accepted to behavioral health hospital per Dr. Sabra Heck.   [place], DO 10/20/14 [phone]
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
## 2014-10-20 NOTE — Plan of Care (Signed)
Problem: Diagnosis: Increased Risk For Suicide Attempt Goal: STG-Patient Will Report Suicidal Feelings to Staff Outcome: Progressing Amrie reports suicidal ideation and is able to contract for safety.

## 2014-10-20 NOTE — [facility] Group Notes (Signed)
[facility] LCSW Group Therapy 10/20/2014  1:15 PM   Type of Therapy: Group Therapy  Participation Level: Did Not Attend. Patient invited to participate but declined.   Devinne Epstein, MSW, LCSWA Clinical Social Worker [facility] [phone]   

## 2014-10-20 NOTE — [facility] Suicide Risk Assessment (Signed)
[facility] INPATIENT:  Family/Significant Other Suicide Prevention Education  Suicide Prevention Education:  Patient Refusal for Family/Significant Other Suicide Prevention Education: The patient [name] has refused to provide written consent for family/significant other to be provided Family/Significant Other Suicide Prevention Education during admission and/or prior to discharge.  Physician notified.  SPE completed with pt, as pt refused to consent to family contact. SPI pamphlet provided to pt and pt was encouraged to share information with support network, ask questions, and talk about any concerns relating to SPE. Pt denies access to guns/firearms and verbalized understanding of information provided. Mobile Crisis information also provided to pt.   Smart, Octavie Westerhold LCSWA  10/20/2014, 4:16 PM
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
## 2014-10-20 NOTE — Progress Notes (Signed)
Recreation Therapy Notes  Date: 09.14.2016 Time: 9:30am  Location: [address] Group Room   Group Topic: Stress Management  Goal Area(s) Addresses:  Patient will actively participate in stress management techniques presented during session.   Behavioral Response: Did not attend.    Laureen Ochs Kingslee Dowse, LRT/CTRS  Oryon Gary L 10/20/2014 11:29 AM
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
## 2014-10-20 NOTE — Progress Notes (Signed)
Patient did not attend N/A group tonight.
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
## 2014-10-20 NOTE — [facility] Group Notes (Signed)
[facility] LCSW Aftercare Discharge Planning Group Note  10/20/2014  8:45 AM  Participation Quality: Did Not Attend. Patient invited to participate but declined.  Diondra Pines, MSW, LCSWA Clinical Social Worker [facility] [phone]    

## 2014-10-21 MED ORDER — LORAZEPAM 1 MG PO TABS: 1.0000 mg | ORAL_TABLET | Freq: Every day | ORAL | Status: DC

## 2014-10-21 MED ORDER — LORAZEPAM 1 MG PO TABS: 1.0000 mg | ORAL_TABLET | Freq: Two times a day (BID) | ORAL | Status: DC

## 2014-10-21 MED ORDER — LORAZEPAM 1 MG PO TABS
1.0000 mg | ORAL_TABLET | Freq: Four times a day (QID) | ORAL | Status: AC
  Administered 2014-10-21 – 2014-10-22 (×6): 1 mg via ORAL
  Filled 2014-10-21 (×6): qty 1

## 2014-10-21 MED ORDER — PRAZOSIN HCL 1 MG PO CAPS
1.0000 mg | ORAL_CAPSULE | Freq: Every day | ORAL | Status: DC
  Administered 2014-10-21 – 2014-11-03 (×13): 1 mg via ORAL
  Filled 2014-10-21 (×18): qty 1

## 2014-10-21 MED ORDER — PAROXETINE HCL 10 MG PO TABS
10.0000 mg | ORAL_TABLET | Freq: Every day | ORAL | Status: DC
  Administered 2014-10-22 – 2014-10-25 (×4): 10 mg via ORAL
  Filled 2014-10-21 (×7): qty 1

## 2014-10-21 MED ORDER — ONDANSETRON 4 MG PO TBDP: 4.0000 mg | ORAL_TABLET | Freq: Four times a day (QID) | ORAL | Status: AC | PRN

## 2014-10-21 MED ORDER — LORAZEPAM 1 MG PO TABS
1.0000 mg | ORAL_TABLET | Freq: Three times a day (TID) | ORAL | Status: DC
Start: 2014-10-23 — End: 2014-10-23
  Administered 2014-10-23 (×2): 1 mg via ORAL
  Filled 2014-10-21 (×2): qty 1

## 2014-10-21 MED ORDER — HYDROXYZINE HCL 25 MG PO TABS: 25.0000 mg | ORAL_TABLET | Freq: Four times a day (QID) | ORAL | Status: DC | PRN

## 2014-10-21 MED ORDER — LORAZEPAM 1 MG PO TABS: 1.0000 mg | ORAL_TABLET | Freq: Four times a day (QID) | ORAL | Status: DC | PRN

## 2014-10-21 MED ORDER — OXCARBAZEPINE 150 MG PO TABS
150.0000 mg | ORAL_TABLET | Freq: Two times a day (BID) | ORAL | Status: DC
Start: 2014-10-21 — End: 2014-10-25
  Administered 2014-10-21 – 2014-10-25 (×8): 150 mg via ORAL
  Filled 2014-10-21 (×13): qty 1

## 2014-10-21 MED ORDER — LOPERAMIDE HCL 2 MG PO CAPS: 2.0000 mg | ORAL_CAPSULE | ORAL | Status: AC | PRN

## 2014-10-21 NOTE — Progress Notes (Signed)
Patient did not attend the evening karaoke group. Pt was notified that group was beginning but remained in bed.    
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
## 2014-10-21 NOTE — Progress Notes (Signed)
D: Patient observed in room in bed. Patient voiced she is unable to eat due to sore throat. She also voiced complaints of generalized pain and questioned neurontin schedule. Voiced not sleeping well. A: Patient educated on medications and times they are due. PRN for pain and sleep administered with good results noted.  R: Patient with no further concerns voiced. Verbalized understanding of medications and its schedule. Observed resting with eyes closed. Fifteen minute checks maintained for safety and monitoring continues.
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
## 2014-10-21 NOTE — [facility] Group Notes (Signed)
[facility] Group Notes:  (Nursing/MHT/Case Management/Adjunct)  Date:  10/21/2014  Time:  11:12 AM  Type of Therapy:  Nurse Education  /  Wellness Group:  The group is focused on helping patients identify why they were hospitalized, what they are doing that is working and what they currently need...influenza order to maintain their wellness.  Participation Level:  Did Not Attend  Participation Quality:    Affect:    Cognitive:    Insight:    Engagement in Group:    Modes of Intervention:    Summary of Progress/Problems:  Lauralyn Primes 10/21/2014, 11:12 AM

## 2014-10-21 NOTE — [facility] Group Notes (Signed)
[place] LCSW Group Therapy  10/21/2014 1:10 PM  Type of Therapy:  Group Therapy  Participation Level:  Did Not Attend-pt in room resting   Modes of Intervention:  Confrontation, Discussion, Education, Exploration, Problem-solving, Rapport Building, Socialization and Support  Summary of Progress/Problems: Emotion Regulation: This group focused on both positive and negative emotion identification and allowed group members to process ways to identify feelings, regulate negative emotions, and find healthy ways to manage internal/external emotions. Group members were asked to reflect on a time when their reaction to an emotion led to a negative outcome and explored how alternative responses using emotion regulation would have benefited them. Group members were also asked to discuss a time when emotion regulation was utilized when a negative emotion was experienced.   Smart, Sukari Grist LCSWA  10/21/2014, 1:10 PM
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
## 2014-10-22 DIAGNOSIS — F1120 Opioid dependence, uncomplicated: Secondary | ICD-10-CM

## 2014-10-22 DIAGNOSIS — F1994 Other psychoactive substance use, unspecified with psychoactive substance-induced mood disorder: Secondary | ICD-10-CM

## 2014-10-22 DIAGNOSIS — F4310 Post-traumatic stress disorder, unspecified: Secondary | ICD-10-CM

## 2014-10-22 NOTE — Progress Notes (Signed)
[name] has remained isolative, quiet and unengaged..sleeping in her bed most of the day. She has yet to complete her daily assessment and says " when is it going to get any better?" . While she contracts verbally to stay safe, she sates she has HI to her perpertrators.   A She does not engage in her groups and says " I don't want to talk".    R Safety is in place and poc contd.

## 2014-10-22 NOTE — Progress Notes (Signed)
D: Pt passive SI-contracts for safety, passive HI.  denies AVH. Pt is pleasant and cooperative. Pt stated she was felling a little better, pt seen walking on the unit a little more this evening than last night.  A: Pt was offered support and encouragement. Pt was given scheduled medications. Pt was encourage to attend groups. Q 15 minute checks were done for safety.    R:Pt attends groups and interacts well with peers and staff. Pt is taking medication. Pt has no complaints at this time .Pt receptive to treatment and safety maintained on unit.

## 2014-10-22 NOTE — Progress Notes (Signed)
[facility] MD Progress Note  10/22/2014 2:52 PM ORLINDA SLOMSKI  MRN:  [id number] Subjective:  Desira states she is still not feeling well. States she feels weak with no energy. She is pushing herself to eat as has no appetite. Still feeling very overwhelmed. Not sure where to go from here. Still with the sense of unreality Principal Problem: Substance induced mood disorder Diagnosis:   Patient Active Problem List   Diagnosis Date Noted  . Polysubstance dependence including opioid type drug, episodic abuse [F11.229] 10/20/2014  . Suicidal ideation [R45.851] 08/03/2014  . Substance abuse [F19.10] 08/03/2014  . Substance induced mood disorder [F19.94] 08/03/2014  . Polysubstance abuse [F19.10]   . Bipolar I disorder, most recent episode depressed [F31.30]   . Insomnia [G47.00]   . BV (bacterial vaginosis) [N76.0, A49.9]   . Cannabis use disorder, mild, abuse [F12.10] 06/18/2014  . Bipolar disorder [F31.9] 06/18/2014  . Seizure disorder [G40.909] 06/18/2014  . PTSD (post-traumatic stress disorder) [F43.10] 06/16/2014   Total Time spent with patient: 30 minutes   Past Medical History:  Past Medical History  Diagnosis Date  . Bipolar 1 disorder   . Depression   . PTSD (post-traumatic stress disorder)   . ADD (attention deficit disorder)   . ADHD (attention deficit hyperactivity disorder)   . Bipolar depression   . Arthritis   . Degenerative disc disease    History reviewed. No pertinent past surgical history. Family History:  Family History  Problem Relation Age of Onset  . Cancer Father    Social History:  History  Alcohol Use  . Yes    Comment: occasionally     History  Drug Use  . Yes  . Special: Marijuana, Cocaine    Comment: pt denies cocaine use, UDS positive    Social History   Social History  . Marital Status: Married    Spouse Name: N/A  . Number of Children: N/A  . Years of Education: N/A   Social History Main Topics  . Smoking status: Current Every Day  Smoker -- 0.50 packs/day for 17 years    Types: Cigarettes  . Smokeless tobacco: Never Used     Comment: last use 1.5 years ago.  . Alcohol Use: Yes     Comment: occasionally  . Drug Use: Yes    Special: Marijuana, Cocaine     Comment: pt denies cocaine use, UDS positive  . Sexual Activity: Yes    Birth Control/ Protection: IUD   Other Topics Concern  . None   Social History Narrative   Additional History:    Sleep: Fair  Appetite:  Poor   Assessment:   Musculoskeletal: Strength & Muscle Tone: within normal limits Gait & Station: normal Patient leans: normal   Psychiatric Specialty Exam: Physical Exam  Review of Systems  Constitutional: Positive for malaise/fatigue.  HENT: Negative.   Eyes: Negative.   Respiratory: Negative.   Cardiovascular: Negative.   Gastrointestinal: Negative.   Genitourinary: Negative.   Musculoskeletal: Positive for myalgias.  Skin: Negative.   Neurological: Positive for weakness.  Endo/Heme/Allergies: Negative.   Psychiatric/Behavioral: Positive for depression and substance abuse. The patient is nervous/anxious.     Blood pressure 112/62, pulse 83, temperature 98.2 F (36.8 C), temperature source Oral, resp. rate 16, height 5\' 5"  (1.651 m), weight 67.586 kg (149 lb), last menstrual period 09/06/2014.Body mass index is 24.79 kg/(m^2).  General Appearance: Disheveled  Eye Contact::  Fair  Speech:  Clear and Coherent, Slow and not spontaneous  Volume:  Decreased  Mood:  Anxious and Depressed  Affect:  Depressed and anxious worried  Thought Process:  Coherent and Goal Directed  Orientation:  Full (Time, Place, and Person)  Thought Content:  symptoms events worries concerns  Suicidal Thoughts:  No  Homicidal Thoughts:  No  Memory:  Immediate;   Fair Recent;   Fair Remote;   Fair  Judgement:  Fair  Insight:  Present  Psychomotor Activity:  Restlessness  Concentration:  Fair  Recall:  [employer] of Knowledge:Fair  Language: Fair   Akathisia:  No  Handed:  Right  AIMS (if indicated):     Assets:  Desire for Improvement  ADL's:  Intact  Cognition: WNL  Sleep:        Current Medications: Current Facility-Administered Medications  Medication Dose Route Frequency Provider Last Rate Last Dose  . acetaminophen (TYLENOL) tablet 650 mg  650 mg Oral Q6H PRN Laverle Hobby, PA-C   650 mg at 10/20/14 0538  . alum & mag hydroxide-simeth (MAALOX/MYLANTA) 200-200-20 MG/5ML suspension 30 mL  30 mL Oral Q4H PRN Laverle Hobby, PA-C      . gabapentin (NEURONTIN) capsule 300 mg  300 mg Oral TID Laverle Hobby, PA-C   300 mg at 10/22/14 1252  . hydrOXYzine (ATARAX/VISTARIL) tablet 50 mg  50 mg Oral Q6H PRN Laverle Hobby, PA-C   50 mg at 10/21/14 2149  . loperamide (IMODIUM) capsule 2-4 mg  2-4 mg Oral PRN Nicholaus Bloom, MD      . LORazepam (ATIVAN) tablet 1 mg  1 mg Oral QID Nicholaus Bloom, MD   1 mg at 10/22/14 1252   Followed by  . [START ON 10/23/2014] LORazepam (ATIVAN) tablet 1 mg  1 mg Oral TID Nicholaus Bloom, MD       Followed by  . [START ON 10/24/2014] LORazepam (ATIVAN) tablet 1 mg  1 mg Oral BID Nicholaus Bloom, MD       Followed by  . [START ON 10/25/2014] LORazepam (ATIVAN) tablet 1 mg  1 mg Oral Daily Nicholaus Bloom, MD      . magic mouthwash  5 mL Oral QID PRN Kerrie Buffalo, NP   5 mL at 10/22/14 0814  . magnesium hydroxide (MILK OF MAGNESIA) suspension 30 mL  30 mL Oral Daily PRN Laverle Hobby, PA-C      . nicotine (NICODERM CQ - dosed in mg/24 hours) patch 21 mg  21 mg Transdermal Daily Nicholaus Bloom, MD   21 mg at 10/22/14 0815  . ondansetron (ZOFRAN-ODT) disintegrating tablet 4 mg  4 mg Oral Q6H PRN Nicholaus Bloom, MD      . OXcarbazepine (TRILEPTAL) tablet 150 mg  150 mg Oral BID Nicholaus Bloom, MD   150 mg at 10/22/14 0815  . PARoxetine (PAXIL) tablet 10 mg  10 mg Oral Daily Nicholaus Bloom, MD   10 mg at 10/22/14 [phone]  . prazosin (MINIPRESS) capsule 1 mg  1 mg Oral QHS Nicholaus Bloom, MD   1 mg at 10/21/14 2149   . traMADol (ULTRAM) tablet 50 mg  50 mg Oral Q6H PRN Laverle Hobby, PA-C   50 mg at 10/20/14 2153  . traZODone (DESYREL) tablet 50 mg  50 mg Oral QHS PRN Laverle Hobby, PA-C   50 mg at 10/21/14 2150    Lab Results: No results found for this or any previous visit (from the past 48 hour(s)).  Physical Findings:  AIMS: Facial and Oral Movements Muscles of Facial Expression: None, normal Lips and Perioral Area: None, normal Jaw: None, normal Tongue: None, normal,Extremity Movements Upper (arms, wrists, hands, fingers): None, normal Lower (legs, knees, ankles, toes): None, normal, Trunk Movements Neck, shoulders, hips: None, normal, Overall Severity Severity of abnormal movements (highest score from questions above): None, normal Incapacitation due to abnormal movements: None, normal Patient's awareness of abnormal movements (rate only patient's report): No Awareness, Dental Status Current problems with teeth and/or dentures?: No Does patient usually wear dentures?: No  CIWA:    COWS:     Treatment Plan Summary: Daily contact with patient to assess and evaluate symptoms and progress in treatment and Medication management Supportive approach/coping skills Polysubstance dependence; address the detox needs/work a relapse prevention plan Continue the Ativan detox protocol Mood instability; will pursue the Trileptal 150 mg BID PTSD; pursue the minipress 1 mg HS Dissociation; work with all the above medications and continue to encourage to process the trauma  Continue to pursue the Paxil for the depression the PTSD and increase the dose to 20 mg daily Use CBT/mindfulness   Medical Decision Making:  Review of Psycho-Social Stressors (1), Review of Medication Regimen & Side Effects (2) and Review of New Medication or Change in Dosage (2)     LUGO,IRVING A 10/22/2014, 2:52 PM

## 2014-10-22 NOTE — BHH Group Notes (Signed)
[facility] LCSW Aftercare Discharge Planning Group Note   10/22/2014 10:35 AM  Participation Quality:  Invited-DID NOT ATTEND. Pt chose to remain in bed.   Smart, [employer]

## 2014-10-22 NOTE — Plan of Care (Signed)
Problem: Ineffective individual coping Goal: STG: Patient will remain free from self harm Outcome: Progressing Pt safe on the unit at this time  Problem: Alteration in mood & ability to function due to Goal: LTG-Pt reports reduction in suicidal thoughts (Patient reports reduction in suicidal thoughts and is able to verbalize a safety plan for whenever patient is feeling suicidal)  Outcome: Not Progressing Pr SI- but contracts for safety

## 2014-10-22 NOTE — BHH Group Notes (Signed)
Adult Psychoeducational Group Note  Date:  10/22/2014 Time:  11:15 PM  Group Topic/Focus:  AA Meeting  Participation Level:  Did Not Attend  Participation Quality:  None  Affect:  None  Cognitive:  None  Insight: None  Engagement in Group:  None  Modes of Intervention:  Discussion and Education  Additional Comments:  Patient did not attend group.  [name] A 10/22/2014, 11:15 PM

## 2014-10-22 NOTE — Progress Notes (Signed)
D:  Pt SI contracts for safety. Pt denies HI/AVH. Pt is pleasant and cooperative. Pt stated she felt like she was still in a dream. Pt stayed in room most of the evening, she did get up to take meds and get snack, pt stiff felt a little uneasy.    A: Pt was offered support and encouragement. Pt was given scheduled medications. Pt was encourage to attend groups. Q 15 minute checks were done for safety.   R: Pt is taking medication. Pt has no complaints.Pt receptive to treatment and safety maintained on unit.

## 2014-10-22 NOTE — BHH Group Notes (Signed)
[place] LCSW Group Therapy  10/22/2014 11:50 AM  Type of Therapy:  Group Therapy  Participation Level:  Did Not Attend-pt invited. Chose to remain in bed to rest.   Modes of Intervention:  Confrontation, Discussion, Education, Exploration, Problem-solving, Rapport Building, Socialization and Support  Summary of Progress/Problems: Feelings around Relapse. Group members discussed the meaning of relapse and shared personal stories of relapse, how it affected them and others, and how they perceived themselves during this time. Group members were encouraged to identify triggers, warning signs and coping skills used when facing the possibility of relapse. Social supports were discussed and explored in detail. Post Acute Withdrawal Syndrome (handout provided) was introduced and examined. Pt's were encouraged to ask questions, talk about key points associated with PAWS, and process this information in terms of relapse prevention.   Smart, HeatherLCSWA 10/22/2014, 11:50 AM

## 2014-10-22 NOTE — Plan of Care (Signed)
Problem: Alteration in mood Goal: LTG-Patient reports reduction in suicidal thoughts (Patient reports reduction in suicidal thoughts and is able to verbalize a safety plan for whenever patient is feeling suicidal)  Outcome: Progressing Pt denies SI at this time     

## 2014-10-23 MED ORDER — ADULT MULTIVITAMIN W/MINERALS CH
1.0000 | ORAL_TABLET | Freq: Every day | ORAL | Status: DC
  Administered 2014-10-23 – 2014-11-04 (×13): 1 via ORAL
  Filled 2014-10-23 (×16): qty 1

## 2014-10-23 MED ORDER — CHLORDIAZEPOXIDE HCL 25 MG PO CAPS
25.0000 mg | ORAL_CAPSULE | Freq: Four times a day (QID) | ORAL | Status: AC
  Administered 2014-10-23 – 2014-10-24 (×6): 25 mg via ORAL
  Filled 2014-10-23 (×6): qty 1

## 2014-10-23 MED ORDER — CHLORDIAZEPOXIDE HCL 25 MG PO CAPS
25.0000 mg | ORAL_CAPSULE | Freq: Four times a day (QID) | ORAL | Status: AC | PRN
  Administered 2014-10-24 – 2014-10-25 (×2): 25 mg via ORAL
  Filled 2014-10-23 (×2): qty 1

## 2014-10-23 MED ORDER — CHLORDIAZEPOXIDE HCL 25 MG PO CAPS
25.0000 mg | ORAL_CAPSULE | ORAL | Status: DC
  Administered 2014-10-26: 25 mg via ORAL
  Filled 2014-10-23: qty 1

## 2014-10-23 MED ORDER — VITAMIN B-1 100 MG PO TABS
100.0000 mg | ORAL_TABLET | Freq: Every day | ORAL | Status: DC
  Administered 2014-10-24 – 2014-11-04 (×12): 100 mg via ORAL
  Filled 2014-10-23 (×14): qty 1

## 2014-10-23 MED ORDER — CHLORDIAZEPOXIDE HCL 25 MG PO CAPS
25.0000 mg | ORAL_CAPSULE | Freq: Three times a day (TID) | ORAL | Status: AC
  Administered 2014-10-25 (×3): 25 mg via ORAL
  Filled 2014-10-23 (×3): qty 1

## 2014-10-23 MED ORDER — CHLORDIAZEPOXIDE HCL 25 MG PO CAPS: 25.0000 mg | ORAL_CAPSULE | Freq: Every day | ORAL | Status: DC
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
## 2014-10-23 NOTE — BHH Group Notes (Signed)
[place] Group Notes: (Clinical Social Work)   10/23/2014      Type of Therapy:  Group Therapy   Participation Level:  Did Not Attend despite MHT prompting   Selmer Dominion, LCSW 10/23/2014, 12:44 PM

## 2014-10-23 NOTE — BHH Group Notes (Signed)
[place] Group Notes:  (Nursing/MHT/Case Management/Adjunct)  Date:  10/23/2014  Time:  0930  Type of Therapy:  Nurse Education  Participation Level:  Did Not Attend  Participation Quality:  Did Not Attend  Affect:  Did Not Attend  Cognitive:  Did Not Attend  Insight:  None  Engagement in Group:  Did Not Attend  Modes of Intervention:  Discussion and Education  Summary of Progress/Problems:  Patient did not attend group.  Today's group focused on Healthy Coping Skills.  Donne Hazel P 10/23/2014, 0930
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
## 2014-10-23 NOTE — Plan of Care (Signed)
Problem: Ineffective individual coping Goal: STG: Patient will remain free from self harm Outcome: Progressing Pt safe on the unit at this time   Problem: Alteration in mood Goal: LTG-Patient reports reduction in suicidal thoughts (Patient reports reduction in suicidal thoughts and is able to verbalize a safety plan for whenever patient is feeling suicidal)  Outcome: Not Progressing Pt SI at this time, but contracts for safety.

## 2014-10-23 NOTE — Progress Notes (Signed)
Patient did not attend the evening speaker AA meeting. Pt was notified that group was beginning but remained in her room.   

## 2014-10-24 DIAGNOSIS — F1920 Other psychoactive substance dependence, uncomplicated: Secondary | ICD-10-CM

## 2014-10-24 NOTE — Progress Notes (Signed)
D. Pt presented to the with flat affect and depressed mood, pt has been isolative and in the room most of the time. Pt denied physical pain. Pt stated he slept well last night, but just don't have enough energy. Pt has been cooperative with with taking her. Pt did no attend groups today. Pt reported that her depression was a 0, her hopelessness was a 0, and that her anxiety was a 0. Pt reported being negative SI/HI, no AH/VH noted. A: 15 min checks continued for patient safety. R: Pts safety maintained.

## 2014-10-24 NOTE — Progress Notes (Signed)
Patient did attend the evening speaker AA meeting.  

## 2014-10-24 NOTE — BHH Group Notes (Signed)
[place] Group Notes:  (Nursing/MHT/Case Management/Adjunct)  Date:  10/24/2014  Time:  3:49 PM Type of Therapy:  Psychoeducational Skills  Participation Level:  Did Not Attend  Participation Quality:  DID NOT ATTEND  Affect:  DID NOT ATTEND  Cognitive:  DID NOT ATTEND  Insight:  None  Engagement in Group:  DID NOT ATTEND  Modes of Intervention:  DID NOT ATTEND  Summary of Progress/Problems: Pt did not attend patient self inventory group.  Wolfgang Phoenix 10/24/2014, 3:49 PM

## 2014-10-24 NOTE — Plan of Care (Signed)
Problem: Alteration in mood Goal: LTG-Pt's behavior demonstrates decreased signs of depression (Patient's behavior demonstrates decreased signs of depression to the point the patient is safe to return home and continue treatment in an outpatient setting)  Outcome: Not Progressing Pt reports having PTSD and has been tearful.
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
## 2014-10-24 NOTE — BHH Group Notes (Signed)
[place] Group Notes: (Clinical Social Work)   10/24/2014      Type of Therapy:  Group Therapy   Participation Level:  Did Not Attend despite MHT prompting   Selmer Dominion, LCSW 10/24/2014, 10:55 AM

## 2014-10-25 DIAGNOSIS — R45851 Suicidal ideations: Secondary | ICD-10-CM

## 2014-10-25 LAB — URINALYSIS, ROUTINE W REFLEX MICROSCOPIC
BILIRUBIN URINE: NEGATIVE
Glucose, UA: NEGATIVE mg/dL
Hgb urine dipstick: NEGATIVE
Ketones, ur: NEGATIVE mg/dL
NITRITE: NEGATIVE
PH: 6.5 (ref 5.0–8.0)
Protein, ur: NEGATIVE mg/dL
SPECIFIC GRAVITY, URINE: 1.02 (ref 1.005–1.030)
UROBILINOGEN UA: 0.2 mg/dL (ref 0.0–1.0)

## 2014-10-25 LAB — URINE MICROSCOPIC-ADD ON

## 2014-10-25 MED ORDER — PAROXETINE HCL 20 MG PO TABS
20.0000 mg | ORAL_TABLET | Freq: Every day | ORAL | Status: DC
  Administered 2014-10-26 – 2014-11-04 (×10): 20 mg via ORAL
  Filled 2014-10-25 (×12): qty 1

## 2014-10-25 MED ORDER — AMPHETAMINE-DEXTROAMPHET ER 10 MG PO CP24
20.0000 mg | ORAL_CAPSULE | Freq: Every day | ORAL | Status: DC
  Administered 2014-10-26: 20 mg via ORAL
  Filled 2014-10-25: qty 2

## 2014-10-25 MED ORDER — OXCARBAZEPINE 150 MG PO TABS
150.0000 mg | ORAL_TABLET | Freq: Four times a day (QID) | ORAL | Status: DC
  Administered 2014-10-25 – 2014-11-04 (×41): 150 mg via ORAL
  Filled 2014-10-25 (×48): qty 1

## 2014-10-25 NOTE — Progress Notes (Signed)
Recreation Therapy Notes  Date: 09.19.2016 Time: 9:30am Location: [address] Group Room   Group Topic: Stress Management  Goal Area(s) Addresses:  Patient will actively participate in stress management techniques presented during session.   Behavioral Response: Did not attend.   Laureen Ochs [place]anch[place], LRT/CTRS  Blanchfield, Denise L 10/25/2014 1:37 PM

## 2014-10-25 NOTE — Progress Notes (Signed)
D. Pt had been up and visible in milieu this evening, did attend and participate in evening group activity. Pt has appeared flat and depressed and has endorsed such and spoke about how she is having difficulties with the on-going trauma that she recently experienced. Pt was tearful and spoke about how she is wanting to get some therapy when she gets discharged from here. Pt received all medications without incident. A. Support and encouragement provided. R. Safety maintained, will continue to monitor.
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
## 2014-10-25 NOTE — Progress Notes (Signed)
D: Patient has been isolative to room, only coming out for meals.  Patient also requests anti-anxiety medication before she is in a social situation as meals.  She denies SI/HI/AVH.  She presents with sad, depressed mood.  She is also anxious and has minimal interaction with peers and staff.   A: Continue to monitor medication management and MD orders.  Safety checks completed every 15 minutes per protocol.  Offer support and encouragement. R: Patient remains isolative to room.

## 2014-10-25 NOTE — Progress Notes (Signed)
[facility] MD Progress Note  10/25/2014 6:21 PM [name]  MRN:  [id number] Subjective:  [name] continues to have a hard time. States she is trying hard to get up and go to groups and be around people but states she is having the nightmares flashbacks and continues to feel dissociated. States she does not have recollection of using the drugs in her UDS. She does say that she needs the Adderall to function. The Adderall helps her stay focus and states it is easier to deal with the other symptoms. Would like to back on all her meds as states that until this happened she was doing pretty well Principal Problem: Substance induced mood disorder Diagnosis:   Patient Active Problem List   Diagnosis Date Noted  . Polysubstance dependence including opioid type drug, episodic abuse [F11.229] 10/20/2014  . Suicidal ideation [R45.851] 08/03/2014  . Substance abuse [F19.10] 08/03/2014  . Substance induced mood disorder [F19.94] 08/03/2014  . Polysubstance abuse [F19.10]   . Bipolar I disorder, most recent episode depressed [F31.30]   . Insomnia [G47.00]   . BV (bacterial vaginosis) [N76.0, A49.9]   . Cannabis use disorder, mild, abuse [F12.10] 06/18/2014  . Bipolar disorder [F31.9] 06/18/2014  . Seizure disorder [G40.909] 06/18/2014  . PTSD (post-traumatic stress disorder) [F43.10] 06/16/2014   Total Time spent with patient: 30 minutes   Past Medical History:  Past Medical History  Diagnosis Date  . Bipolar 1 disorder   . Depression   . PTSD (post-traumatic stress disorder)   . ADD (attention deficit disorder)   . ADHD (attention deficit hyperactivity disorder)   . Bipolar depression   . Arthritis   . Degenerative disc disease    History reviewed. No pertinent past surgical history. Family History:  Family History  Problem Relation Age of Onset  . Cancer Father    Social History:  History  Alcohol Use  . Yes    Comment: occasionally     History  Drug Use  . Yes  . Special:  Marijuana, Cocaine    Comment: pt denies cocaine use, UDS positive    Social History   Social History  . Marital Status: Married    Spouse Name: N/A  . Number of Children: N/A  . Years of Education: N/A   Social History Main Topics  . Smoking status: Current Every Day Smoker -- 0.50 packs/day for 17 years    Types: Cigarettes  . Smokeless tobacco: Never Used     Comment: last use 1.5 years ago.  . Alcohol Use: Yes     Comment: occasionally  . Drug Use: Yes    Special: Marijuana, Cocaine     Comment: pt denies cocaine use, UDS positive  . Sexual Activity: Yes    Birth Control/ Protection: IUD   Other Topics Concern  . None   Social History Narrative   Additional History:    Sleep: Poor  Appetite:  Poor   Assessment:   Musculoskeletal: Strength & Muscle Tone: within normal limits Gait & Station: normal Patient leans: normal   Psychiatric Specialty Exam: Physical Exam  Review of Systems  Constitutional: Positive for malaise/fatigue.  Eyes: Negative.   Respiratory: Negative.   Cardiovascular: Negative.   Gastrointestinal: Negative.   Genitourinary: Negative.   Musculoskeletal: Positive for myalgias.  Skin: Negative.   Neurological: Positive for weakness and headaches.  Endo/Heme/Allergies: Negative.   Psychiatric/Behavioral: Positive for depression, suicidal ideas and substance abuse. The patient is nervous/anxious and has insomnia.     Blood  pressure 108/55, pulse 95, temperature 97.8 F (36.6 C), temperature source Oral, resp. rate 16, height 5\' 5"  (1.651 m), weight 67.586 kg (149 lb), last menstrual period 09/06/2014.Body mass index is 24.79 kg/(m^2).  General Appearance: Fairly Groomed  Engineer, water::  Fair  Speech:  Clear and Coherent  Volume:  Decreased  Mood:  Anxious and Depressed  Affect:  Restricted  Thought Process:  Coherent and Goal Directed  Orientation:  Full (Time, Place, and Person)  Thought Content:  symtoms events worries concerns   Suicidal Thoughts:  Yes.  without intent/plan  Homicidal Thoughts:  No  Memory:  Immediate;   Fair Recent;   Fair Remote;   Fair  Judgement:  Fair  Insight:  Present  Psychomotor Activity:  Restlessness  Concentration:  Fair  Recall:  [employer] of Knowledge:Fair  Language: Fair  Akathisia:  No  Handed:  Right  AIMS (if indicated):     Assets:  Desire for Improvement  ADL's:  Intact  Cognition: WNL  Sleep:  Number of Hours: 5     Current Medications: Current Facility-Administered Medications  Medication Dose Route Frequency Provider Last Rate Last Dose  . acetaminophen (TYLENOL) tablet 650 mg  650 mg Oral Q6H PRN Laverle Hobby, PA-C   650 mg at 10/20/14 0538  . alum & mag hydroxide-simeth (MAALOX/MYLANTA) 200-200-20 MG/5ML suspension 30 mL  30 mL Oral Q4H PRN Laverle Hobby, PA-C      . [START ON 10/26/2014] amphetamine-dextroamphetamine (ADDERALL XR) 24 hr capsule 20 mg  20 mg Oral Daily Nicholaus Bloom, MD      . chlordiazePOXIDE (LIBRIUM) capsule 25 mg  25 mg Oral Q6H PRN Kerrie Buffalo, NP   25 mg at 10/24/14 1458  . [START ON 10/26/2014] chlordiazePOXIDE (LIBRIUM) capsule 25 mg  25 mg Oral BH-qamhs Kerrie Buffalo, NP       Followed by  . [START ON 10/27/2014] chlordiazePOXIDE (LIBRIUM) capsule 25 mg  25 mg Oral Daily Kerrie Buffalo, NP      . gabapentin (NEURONTIN) capsule 300 mg  300 mg Oral TID Laverle Hobby, PA-C   300 mg at 10/25/14 1733  . hydrOXYzine (ATARAX/VISTARIL) tablet 50 mg  50 mg Oral Q6H PRN Laverle Hobby, PA-C   50 mg at 10/25/14 1813  . magic mouthwash  5 mL Oral QID PRN Kerrie Buffalo, NP   5 mL at 10/22/14 0814  . magnesium hydroxide (MILK OF MAGNESIA) suspension 30 mL  30 mL Oral Daily PRN Laverle Hobby, PA-C      . multivitamin with minerals tablet 1 tablet  1 tablet Oral Daily Kerrie Buffalo, NP   1 tablet at 10/25/14 0807  . nicotine (NICODERM CQ - dosed in mg/24 hours) patch 21 mg  21 mg Transdermal Daily Nicholaus Bloom, MD   21 mg at 10/25/14  [id number]  . OXcarbazepine (TRILEPTAL) tablet 150 mg  150 mg Oral QID Nicholaus Bloom, MD   150 mg at 10/25/14 1732  . [START ON 10/26/2014] PARoxetine (PAXIL) tablet 20 mg  20 mg Oral Daily Nicholaus Bloom, MD      . prazosin (MINIPRESS) capsule 1 mg  1 mg Oral QHS Nicholaus Bloom, MD   1 mg at 10/24/14 2138  . thiamine (VITAMIN B-1) tablet 100 mg  100 mg Oral Daily Kerrie Buffalo, NP   100 mg at 10/25/14 0807  . traMADol (ULTRAM) tablet 50 mg  50 mg Oral Q6H PRN Laverle Hobby, PA-C  50 mg at 10/24/14 2138  . traZODone (DESYREL) tablet 50 mg  50 mg Oral QHS PRN Laverle Hobby, PA-C   50 mg at 10/24/14 2138    Lab Results: No results found for this or any previous visit (from the past 48 hour(s)).  Physical Findings: AIMS: Facial and Oral Movements Muscles of Facial Expression: None, normal Lips and Perioral Area: None, normal Jaw: None, normal Tongue: None, normal,Extremity Movements Upper (arms, wrists, hands, fingers): None, normal Lower (legs, knees, ankles, toes): None, normal, Trunk Movements Neck, shoulders, hips: None, normal, Overall Severity Severity of abnormal movements (highest score from questions above): None, normal Incapacitation due to abnormal movements: None, normal Patient's awareness of abnormal movements (rate only patient's report): No Awareness, Dental Status Current problems with teeth and/or dentures?: No Does patient usually wear dentures?: No  CIWA:  CIWA-Ar Total: 2 COWS:     Treatment Plan Summary: Daily contact with patient to assess and evaluate symptoms and progress in treatment and Medication management Supportive approach/coping skills Substance abuse; continue to work a relapse prevention plan Mood instability; continue to work with the Trileptal and increase it to QID Depression/PTSD: continue to work with the Paxil and increase up to 40 mg as tolerated ,will increase to 20 mg today Nightmares; will continue the Prazosin 1 mg consider increasing to 2 mg  HS Inattentiveness; will resume the Adderall in the morning at 20 mg X Use CBT/mindfulness Medical Decision Making:  Review of Psycho-Social Stressors (1), Review of Medication Regimen & Side Effects (2) and Review of New Medication or Change in Dosage (2)     LUGO,IRVING A 10/25/2014, 6:21 PM

## 2014-10-25 NOTE — BHH Group Notes (Signed)
[facility] LCSW Aftercare Discharge Planning Group Note  10/25/2014  8:45 AM  Participation Quality: Did Not Attend. Patient invited to participate but declined.  Tilden Fossa, MSW, [place] Worker [facility] [phone]

## 2014-10-26 MED ORDER — NICOTINE POLACRILEX 2 MG MT GUM
2.0000 mg | CHEWING_GUM | OROMUCOSAL | Status: DC | PRN
  Administered 2014-10-26 – 2014-11-04 (×24): 2 mg via ORAL
  Filled 2014-10-26 (×10): qty 1

## 2014-10-26 MED ORDER — TRAZODONE HCL 50 MG PO TABS
50.0000 mg | ORAL_TABLET | Freq: Every evening | ORAL | Status: DC | PRN
  Administered 2014-10-26 – 2014-11-03 (×13): 50 mg via ORAL
  Filled 2014-10-26 (×25): qty 1

## 2014-10-26 MED ORDER — AMPHETAMINE-DEXTROAMPHET ER 10 MG PO CP24
30.0000 mg | ORAL_CAPSULE | Freq: Every day | ORAL | Status: DC
  Administered 2014-10-27 – 2014-11-04 (×9): 30 mg via ORAL
  Filled 2014-10-26 (×9): qty 3

## 2014-10-26 NOTE — Progress Notes (Signed)
D:Pt rates depression as an 8 and anxiety as a 7 on 0-10 scale with 10 being the most. Pt reports that she slept poorly and has been in the bed on and off throughout the day. Pt reports passive si within the past 24 hours. She is concerned about no family support and no transportation. A:Offered support, encouragement and 15 minute checks. R:Safety maintained on the unit.
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
## 2014-10-26 NOTE — BHH Group Notes (Addendum)
[place] LCSW Group Therapy 10/26/2014 1:15 PM Type of Therapy: Group Therapy Participation Level: Active  Participation Quality: Attentive, Sharing and Supportive  Affect: Appropriate  Cognitive: Alert and Oriented  Insight: Developing/Improving and Engaged  Engagement in Therapy: Developing/Improving and Engaged  Modes of Intervention: Activity, Clarification, Confrontation, Discussion, Education, Exploration, Limit-setting, Orientation, Problem-solving, Rapport Building, Art therapist, Socialization and Support  Summary of Progress/Problems: Patient was attentive and engaged with speaker from [place]. Patient was attentive to speaker while they shared their story of dealing with mental health and overcoming it. Patient expressed interest in their programs and services and received information on their agency. Patient processed ways they can relate to the speaker.   Tilden Fossa, MSW, [place] Worker [facility] [phone]
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
## 2014-10-26 NOTE — Clinical Social Work Note (Signed)
Patient declined at [facility] due to PTSD and MDD symptoms being primary concern at this time.  Tilden Fossa, MSW, [place] Worker [facility] [phone]

## 2014-10-26 NOTE — Plan of Care (Signed)
Problem: Alteration in mood Goal: LTG-Patient reports reduction in suicidal thoughts (Patient reports reduction in suicidal thoughts and is able to verbalize a safety plan for whenever patient is feeling suicidal)  Outcome: Not Progressing Pt si- contracts for safety.
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
## 2014-10-26 NOTE — Plan of Care (Signed)
Problem: Ineffective individual coping Goal: LTG: Patient will report a decrease in negative feelings Outcome: Not Progressing Pt continues to reports passive si
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
## 2014-10-26 NOTE — Progress Notes (Signed)
D: Pt  Pt +ve SI/ HI - contracts for safety. denies AVH. Pt is pleasant and cooperative. Pt visible on the unit, pt interacting with peers. Pt appears sad, but pt talks to staff.   A: Pt was offered support and encouragement. Pt was given scheduled medications. Pt was encourage to attend groups. Q 15 minute checks were done for safety.    R:Pt attends groups and interacts well with peers and staff. Pt is taking medication. Pt has no complaints at this time .Pt receptive to treatment and safety maintained on unit.

## 2014-10-27 LAB — RAPID HIV SCREEN (HIV 1/2 AB+AG)
HIV 1/2 Antibodies: NONREACTIVE
HIV-1 P24 Antigen - HIV24: NONREACTIVE

## 2014-10-27 MED ORDER — GABAPENTIN 400 MG PO CAPS
400.0000 mg | ORAL_CAPSULE | Freq: Three times a day (TID) | ORAL | Status: DC
  Administered 2014-10-27 – 2014-10-30 (×10): 400 mg via ORAL
  Filled 2014-10-27 (×16): qty 1

## 2014-10-27 NOTE — Progress Notes (Signed)
Recreation Therapy Notes  Date: 09.21.2016 Time:  9:30am Location: 300 Hall Group Room   Group Topic: Stress Management  Goal Area(s) Addresses:  Patient will actively participate in stress management techniques presented during session.   Behavioral Response: Did not attend.   Laureen Ochs Blanchfield, LRT/CTRS        Blanchfield, Denise L 10/27/2014 2:45 PM

## 2014-10-27 NOTE — Clinical Social Work Note (Signed)
[facility] application faxed for review.   Tilden Fossa, MSW, [place] Worker [facility] [phone]

## 2014-10-27 NOTE — Plan of Care (Signed)
Problem: Alteration in mood Goal: LTG-Patient reports reduction in suicidal thoughts (Patient reports reduction in suicidal thoughts and is able to verbalize a safety plan for whenever patient is feeling suicidal)  Outcome: Not Progressing Pt SI/HI but contracts for safety.   Problem: Alteration in mood & ability to function due to Goal: STG-Patient will report withdrawal symptoms Outcome: Progressing Pt had no complaints this evening of any withdrawal Sx.

## 2014-10-27 NOTE — Progress Notes (Signed)
D:Per patient self inventory form pt reports she slept fair last night with the use of sleep medication. She rates a poor appetite, low energy level, poor concentration. She rates depression 7/10, hopelessness 8/10, anxiety 10/10- all on 0-10 scale, 10 being the worse. Pt reports passive SI, but is able to verbally contract for safety. Pt denies HI. Pt denies AVH. Pt reports neck and shoulder pain 8/10. Pt reports her goal for the day is "finding placement for continued care (long term 30, 60 days) medication worked out. Eat more, become more social." Pt noted socializing with peers on the hall, laughing and joking.  A:Special checks q 15 mins in place for safety. Medication administered per MD order (see emar) Encouragement and support provided. CIWA protocol in place.  R:Safety maintained. Compliant with medication regimen. Will continue to monitor.

## 2014-10-27 NOTE — [facility] Group Notes (Signed)
[facility] LCSW Aftercare Discharge Planning Group Note  10/27/2014  8:45 AM  Participation Quality: Did Not Attend. Patient invited to participate but declined.  Tilden Fossa, MSW, [place] Worker [facility] [phone]
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
## 2014-10-27 NOTE — BHH Group Notes (Signed)
[place] LCSW Group Therapy 10/27/2014  1:15 PM Type of Therapy: Group Therapy Participation Level: Active  Participation Quality: Attentive, Sharing and Supportive  Affect: Appropriate  Cognitive: Alert and Oriented  Insight: Developing/Improving and Engaged  Engagement in Therapy: Developing/Improving and Engaged  Modes of Intervention: Clarification, Confrontation, Discussion, Education, Exploration, Limit-setting, Orientation, Problem-solving, Rapport Building, Art therapist, Socialization and Support  Summary of Progress/Problems: The topic for group today was emotional regulation. This group focused on both positive and negative emotion identification and allowed group members to process ways to identify feelings, regulate negative emotions, and find healthy ways to manage internal/external emotions. Group members were asked to reflect on a time when their reaction to an emotion led to a negative outcome and explored how alternative responses using emotion regulation would have benefited them. Group members were also asked to discuss a time when emotion regulation was utilized when a negative emotion was experienced. Patient discussed having to stay strong emotionally for her family and children after her mother's suicide and her father became medically ill. She shared that she then turned to abusing substances as a way to numb her emotions. She reports that she is ready to take care of herself which includes facing her emotions sober. CSW and other group members provided patient with emotional support and encouragement.  Tilden Fossa, MSW, [place] Worker [facility] [phone]

## 2014-10-27 NOTE — Progress Notes (Signed)
Pt attended NA group this evening.  

## 2014-10-28 DIAGNOSIS — F909 Attention-deficit hyperactivity disorder, unspecified type: Secondary | ICD-10-CM

## 2014-10-28 LAB — RPR: RPR Ser Ql: NONREACTIVE

## 2014-10-28 LAB — GC/CHLAMYDIA PROBE AMP (~~LOC~~) NOT AT ARMC
CHLAMYDIA, DNA PROBE: NEGATIVE
NEISSERIA GONORRHEA: NEGATIVE

## 2014-10-28 MED ORDER — METHOCARBAMOL 500 MG PO TABS
500.0000 mg | ORAL_TABLET | Freq: Four times a day (QID) | ORAL | Status: DC | PRN
  Administered 2014-10-28 – 2014-10-30 (×5): 500 mg via ORAL
  Filled 2014-10-28 (×5): qty 1

## 2014-10-28 NOTE — BHH Group Notes (Signed)
[place] Group Notes:  (Nursing/MHT/Case Management/Adjunct)  Date:  10/28/2014  Time:  0900  Type of Therapy:  Nurse Education  Participation Level:  Did Not Attend  Participation Quality:    Affect:    Cognitive:    Insight:    Engagement in Group:    Modes of Intervention:    Summary of Progress/Problems: Patient was invited however elected not to attend.  Jamie Kato 10/28/2014, 9:36 AM

## 2014-10-28 NOTE — Progress Notes (Signed)
D: [name] has some passive SI but contracts for safety. Denies HI/AVH. She attended p.m but not a.m groups. Appears dysphoric. She rates depression 6 and hopelessness 7. She says her anxiety "goes up and down through the day. She voiced concerns about getting the necessary documents (license, etc.) to get needed aftercare. A: Meds given as ordered. Q15 safety checks maintained. Support/encouragement offered. R: Pt remains free from harm and continues with treatment. Will continue to monitor for needs/safety.
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
## 2014-10-28 NOTE — Clinical Social Work Note (Signed)
Message left for [facility] regarding application status. Awaiting return call.  Tilden Fossa, MSW, [place] Worker [facility] [phone]

## 2014-10-28 NOTE — Progress Notes (Signed)
D: Pt SI/HI- contracts for safety. Denies AVH at this time. Pt is pleasant and cooperative. Pt observed interacting with peers , mainly Lennette Bihari, talking on the phone frequently.   A: Pt was offered support and encouragement. Pt was given scheduled medications. Pt was encourage to attend groups. Q 15 minute checks were done for safety.   R:Pt attends groups and interacts well with peers and staff. Pt is taking medication. Pt has no complaints at this time.Pt receptive to treatment and safety maintained on unit.

## 2014-10-28 NOTE — Plan of Care (Signed)
Problem: Alteration in mood Goal: LTG-Patient reports reduction in suicidal thoughts (Patient reports reduction in suicidal thoughts and is able to verbalize a safety plan for whenever patient is feeling suicidal)  Outcome: Not Progressing SI/HI contracts for safety  Problem: Alteration in mood & ability to function due to Goal: LTG-Pt reports reduction in suicidal thoughts (Patient reports reduction in suicidal thoughts and is able to verbalize a safety plan for whenever patient is feeling suicidal)  Outcome: Not Progressing Pt continues to be SI-contracts for safety
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
## 2014-10-28 NOTE — Progress Notes (Signed)
Adult Psychoeducational Group Note  Date:  10/28/2014 Time:  10:22 PM  Group Topic/Focus:  Wrap-Up Group:   The focus of this group is to help patients review their daily goal of treatment and discuss progress on daily workbooks.  Additional Comments:   Pt attended [name], [name] 10/28/2014, 10:22 PM

## 2014-10-28 NOTE — BHH Counselor (Signed)
[place] LCSW Group Therapy  10/28/2014   1:15 PM   Type of Therapy:  Group Therapy  Participation Level:  Active  Participation Quality:  Attentive, Sharing and Supportive  Affect: Appropriate  Cognitive:  Alert and Oriented  Insight:  Developing/Improving and Engaged  Engagement in Therapy:  Developing/Improving and Engaged  Modes of Intervention:  Clarification, Confrontation, Discussion, Education, Exploration, Limit-setting, Orientation, Problem-solving, Rapport Building, Art therapist, Socialization and Support  Summary of Progress/Problems: The topic for group therapy was feelings about diagnosis.  Pt actively participated in group discussion on their past and current diagnosis and how they feel towards this.  Pt also identified how society and family members judge them, based on their diagnosis as well as stereotypes and stigmas.  When asked how she feels about her mental illness/addiction, patient identified "powerless" and "misunderstood". Patient shared that she wants others to trust her and believe what she tells them. She discussed her recent trauma with the group and discussed feelings of frustration at not being believed by Event organiser. Patient also shared that she wants people to see her as more than her addiction. CSW and other group members provided patient with emotional support and encouragement.  Tilden Fossa, MSW, [place] Worker [facility] [phone]

## 2014-10-29 NOTE — BHH Group Notes (Signed)
[place] LCSW Group Therapy 10/29/2014 1:15 PM Type of Therapy: Group Therapy Participation Level: Active  Participation Quality: Attentive, Sharing and Supportive  Affect: Appropriate  Cognitive: Alert and Oriented  Insight: Developing/Improving and Engaged  Engagement in Therapy: Developing/Improving and Engaged  Modes of Intervention: Clarification, Confrontation, Discussion, Education, Exploration, Limit-setting, Orientation, Problem-solving, Rapport Building, Art therapist, Socialization and Support  Summary of Progress/Problems: The topic for today was feelings about relapse. Pt discussed what relapse prevention is to them and identified triggers that they are on the path to relapse. Pt processed their feeling towards relapse and was able to relate to peers. Pt discussed coping skills that can be used for relapse prevention. Patient discussed experiencing "a thrill in the chase" for drugs. She discussed feelings of disappointment in letting others down when she was using. She identified her husband and negative thinking as triggers for relapse. Patient identified running, listening to music, and spending time with her children as positive coping skills. CSW and other group members provided patient with emotional support and encouragement.   Tilden Fossa, MSW, [place] Worker [facility] [phone]

## 2014-10-29 NOTE — Progress Notes (Signed)
[facility] MD Progress Note  10/29/2014 3:16 PM [name]  MRN:  [id number] Subjective:  Still trying to get herself together. Still trying to process and get to terms with all that has been going on. He still feels somewhat unstable. States she needs a long term program in which she can deal with the substances and also the trauma. She feels she is gradually getting there. There seems to be a long term program in [place] that will grant her an interview on Monday. She is afraid to be out there with no structure. Feels this is it for her Principal Problem: Substance induced mood disorder Diagnosis:   Patient Active Problem List   Diagnosis Date Noted  . Polysubstance dependence including opioid type drug, episodic abuse [F11.229] 10/20/2014  . Suicidal ideation [R45.851] 08/03/2014  . Substance abuse [F19.10] 08/03/2014  . Substance induced mood disorder [F19.94] 08/03/2014  . Polysubstance abuse [F19.10]   . Bipolar I disorder, most recent episode depressed [F31.30]   . Insomnia [G47.00]   . BV (bacterial vaginosis) [N76.0, A49.9]   . Cannabis use disorder, mild, abuse [F12.10] 06/18/2014  . Bipolar disorder [F31.9] 06/18/2014  . Seizure disorder [G40.909] 06/18/2014  . PTSD (post-traumatic stress disorder) [F43.10] 06/16/2014   Total Time spent with patient: 30 minutes   Past Medical History:  Past Medical History  Diagnosis Date  . Bipolar 1 disorder   . Depression   . PTSD (post-traumatic stress disorder)   . ADD (attention deficit disorder)   . ADHD (attention deficit hyperactivity disorder)   . Bipolar depression   . Arthritis   . Degenerative disc disease    History reviewed. No pertinent past surgical history. Family History:  Family History  Problem Relation Age of Onset  . Cancer Father    Social History:  History  Alcohol Use  . Yes    Comment: occasionally     History  Drug Use  . Yes  . Special: Marijuana, Cocaine    Comment: pt denies cocaine use, UDS  positive    Social History   Social History  . Marital Status: Married    Spouse Name: N/A  . Number of Children: N/A  . Years of Education: N/A   Social History Main Topics  . Smoking status: Current Every Day Smoker -- 0.50 packs/day for 17 years    Types: Cigarettes  . Smokeless tobacco: Never Used     Comment: last use 1.5 years ago.  . Alcohol Use: Yes     Comment: occasionally  . Drug Use: Yes    Special: Marijuana, Cocaine     Comment: pt denies cocaine use, UDS positive  . Sexual Activity: Yes    Birth Control/ Protection: IUD   Other Topics Concern  . None   Social History Narrative   Additional History:    Sleep: Fair  Appetite:  Fair   Assessment:   Musculoskeletal: Strength & Muscle Tone: within normal limits Gait & Station: normal Patient leans: normal   Psychiatric Specialty Exam: Physical Exam  Review of Systems  Constitutional: Negative.   HENT: Negative.   Eyes: Negative.   Respiratory: Negative.   Cardiovascular: Negative.   Gastrointestinal: Negative.   Genitourinary: Negative.   Musculoskeletal: Positive for myalgias.  Skin: Negative.   Neurological: Negative.   Endo/Heme/Allergies: Negative.   Psychiatric/Behavioral: Positive for depression and substance abuse. The patient is nervous/anxious.     Blood pressure 126/85, pulse 84, temperature 98.1 F (36.7 C), temperature source Oral, resp. rate  16, height 5\' 5"  (1.651 m), weight 67.586 kg (149 lb), last menstrual period 09/06/2014.Body mass index is 24.79 kg/(m^2).  General Appearance: Fairly Groomed  Engineer, water::  Fair  Speech:  Clear and Coherent  Volume:  fluctuates  Mood:  Anxious and worried  Affect:  Restricted  Thought Process:  Coherent and Goal Directed  Orientation:  Full (Time, Place, and Person)  Thought Content:  symptoms events worries concerns  Suicidal Thoughts:  No  Homicidal Thoughts:  No  Memory:  Immediate;   Fair Recent;   Fair Remote;   Fair   Judgement:  Fair  Insight:  Present  Psychomotor Activity:  Restlessness  Concentration:  Fair  Recall:  [employer] of Knowledge:Fair  Language: Fair  Akathisia:  No  Handed:  Right  AIMS (if indicated):     Assets:  Desire for Improvement  ADL's:  Intact  Cognition: WNL  Sleep:  Number of Hours: 5     Current Medications: Current Facility-Administered Medications  Medication Dose Route Frequency Provider Last Rate Last Dose  . acetaminophen (TYLENOL) tablet 650 mg  650 mg Oral Q6H PRN Laverle Hobby, PA-C   650 mg at 10/20/14 0538  . alum & mag hydroxide-simeth (MAALOX/MYLANTA) 200-200-20 MG/5ML suspension 30 mL  30 mL Oral Q4H PRN Laverle Hobby, PA-C      . amphetamine-dextroamphetamine (ADDERALL XR) 24 hr capsule 30 mg  30 mg Oral Daily Nicholaus Bloom, MD   30 mg at 10/29/14 0846  . gabapentin (NEURONTIN) capsule 400 mg  400 mg Oral TID Nicholaus Bloom, MD   400 mg at 10/29/14 1203  . hydrOXYzine (ATARAX/VISTARIL) tablet 50 mg  50 mg Oral Q6H PRN Laverle Hobby, PA-C   50 mg at 10/28/14 2031  . magic mouthwash  5 mL Oral QID PRN Kerrie Buffalo, NP   5 mL at 10/22/14 0814  . magnesium hydroxide (MILK OF MAGNESIA) suspension 30 mL  30 mL Oral Daily PRN Laverle Hobby, PA-C      . methocarbamol (ROBAXIN) tablet 500 mg  500 mg Oral Q6H PRN Nicholaus Bloom, MD   500 mg at 10/29/14 1047  . multivitamin with minerals tablet 1 tablet  1 tablet Oral Daily Kerrie Buffalo, NP   1 tablet at 10/29/14 0846  . nicotine polacrilex (NICORETTE) gum 2 mg  2 mg Oral PRN Nicholaus Bloom, MD   2 mg at 10/29/14 1048  . OXcarbazepine (TRILEPTAL) tablet 150 mg  150 mg Oral QID Nicholaus Bloom, MD   150 mg at 10/29/14 1203  . PARoxetine (PAXIL) tablet 20 mg  20 mg Oral Daily Nicholaus Bloom, MD   20 mg at 10/29/14 0846  . prazosin (MINIPRESS) capsule 1 mg  1 mg Oral QHS Nicholaus Bloom, MD   1 mg at 10/28/14 2159  . thiamine (VITAMIN B-1) tablet 100 mg  100 mg Oral Daily Kerrie Buffalo, NP   100 mg at 10/29/14  0847  . traMADol (ULTRAM) tablet 50 mg  50 mg Oral Q6H PRN Laverle Hobby, PA-C   50 mg at 10/24/14 2138  . traZODone (DESYREL) tablet 50 mg  50 mg Oral QHS,MR X 1 Nicholaus Bloom, MD   50 mg at 10/28/14 2159    Lab Results:  Results for orders placed or performed during the hospital encounter of 10/20/14 (from the past 48 hour(s))  RPR     Status: None   Collection Time: 10/27/14  7:22  PM  Result Value Ref Range   RPR Ser Ql Non Reactive Non Reactive    Comment: (NOTE) Performed At: [facility] [place], [place] [id number] Lindon Romp MD [id number] Performed at [facility]   Rapid HIV screen (HIV 1/2 Ab+Ag)     Status: None   Collection Time: 10/27/14  7:22 PM  Result Value Ref Range   HIV-1 P24 Antigen - HIV24 NON REACTIVE NON REACTIVE   HIV 1/2 Antibodies NON REACTIVE NON REACTIVE   Interpretation (HIV Ag Ab)      A non reactive test result means that HIV 1 or HIV 2 antibodies and HIV 1 p24 antigen were not detected in the specimen.    Comment: Performed at [facility]    Physical Findings: AIMS: Facial and Oral Movements Muscles of Facial Expression: None, normal Lips and Perioral Area: None, normal Jaw: None, normal Tongue: None, normal,Extremity Movements Upper (arms, wrists, hands, fingers): None, normal Lower (legs, knees, ankles, toes): None, normal, Trunk Movements Neck, shoulders, hips: None, normal, Overall Severity Severity of abnormal movements (highest score from questions above): None, normal Incapacitation due to abnormal movements: None, normal Patient's awareness of abnormal movements (rate only patient's report): No Awareness, Dental Status Current problems with teeth and/or dentures?: No Does patient usually wear dentures?: No  CIWA:  CIWA-Ar Total: 0 COWS:     Treatment Plan Summary: Daily contact with patient to assess and evaluate symptoms and progress in treatment and Medication  management Polysubstance dependence; continue to work a relapse prevention plan PTSD; continue to process the trauma continue the Paxil and the Prazosin Mood instability; continue the Trileptal 150 mg QID Pain; continue the Neurontin/Robaxin ADHD/dissociation; continue the Adderall XR 30 mg in AM Continue to explore residential treatment programs co morbid substance abuse/PTSD  Medical Decision Making:  Review of Psycho-Social Stressors (1) and Review of Medication Regimen & Side Effects (2)     Janika Jedlicka A 10/29/2014, 3:16 PM

## 2014-10-29 NOTE — BHH Group Notes (Signed)
[facility] LCSW Aftercare Discharge Planning Group Note  10/29/2014  8:45 AM  Participation Quality: Did Not Attend. Patient invited to participate but declined.  Tilden Fossa, MSW, [place] Worker [facility] [phone]
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
## 2014-10-29 NOTE — Progress Notes (Signed)
Recreation Therapy Notes  Date: 09.23.2016 Time: 9:30am Location: 300 Hall Group room   Group Topic: Stress Management  Goal Area(s) Addresses:  Patient will actively participate in stress management techniques presented during session.   Behavioral Response: Did not attend.   Laureen Ochs Blanchfield, LRT/CTRS        Blanchfield, Denise L 10/29/2014 1:47 PM

## 2014-10-29 NOTE — Plan of Care (Signed)
Problem: Ineffective individual coping Goal: STG: Patient will remain free from self harm Outcome: Progressing Pt denying si thoughts "at this moment."
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
## 2014-10-29 NOTE — Progress Notes (Signed)
Pt attended AA group this evening.  

## 2014-10-29 NOTE — Clinical Social Work Note (Signed)
Patient has telephone interview with [facility] scheduled for Monday 9/26 at [place], MSW, [place] Worker [facility] [phone]

## 2014-10-29 NOTE — Plan of Care (Signed)
Problem: Diagnosis: Increased Risk For Suicide Attempt Goal: STG-Patient Will Comply With Medication Regime Outcome: Progressing Client compliant with medication regime, AEB reporting pain levels and anxiety using 1-10 scale.

## 2014-10-30 MED ORDER — METHOCARBAMOL 750 MG PO TABS
750.0000 mg | ORAL_TABLET | Freq: Four times a day (QID) | ORAL | Status: DC | PRN
  Administered 2014-10-30 – 2014-11-02 (×8): 750 mg via ORAL
  Filled 2014-10-30 (×8): qty 1

## 2014-10-30 MED ORDER — GABAPENTIN 300 MG PO CAPS
600.0000 mg | ORAL_CAPSULE | Freq: Three times a day (TID) | ORAL | Status: DC
  Administered 2014-10-30 – 2014-11-04 (×16): 600 mg via ORAL
  Filled 2014-10-30 (×20): qty 2
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
## 2014-10-30 NOTE — Plan of Care (Signed)
Problem: Ineffective individual coping Goal: STG: Patient will participate in after care plan Outcome: Progressing Pt is considering going to a two year program in [place]

## 2014-10-30 NOTE — Progress Notes (Signed)
D . Pt pleasant on approach, complaint of continued anxiety and some insomnia.  Denies SI/HI/hallucinations at this time.  Positive for evening AA group, interacting appropriately with peers on the unit.  A.  Support and encouragement offered.  R.  Pt remains safe on unit, will continue to monitor.

## 2014-10-30 NOTE — Progress Notes (Signed)
Pt attended AA group this evening.  

## 2014-10-30 NOTE — Progress Notes (Signed)
Adult Psychoeducational Group Note  Date:  10/30/2014 Time:  0900  Group Topic/Focus:  Making Healthy Choices:   The focus of this group is to help patients identify negative/unhealthy choices they were using prior to admission and identify positive/healthier coping strategies to replace them upon discharge.  Participation Level:  Active  Participation Quality:  Appropriate  Affect:  Appropriate  Cognitive:  Appropriate  Insight: Appropriate  Engagement in Group:  Engaged  Modes of Intervention:  Discussion and Education  Additional Comments:    White, Patrice L 10/30/2014, 10:29 AM

## 2014-10-30 NOTE — BHH Group Notes (Signed)
[place] LCSW Group Therapy  10/30/2014  10:10 - 11:00 AM  Type of Therapy:  Group Therapy  Participation Level:  Active  Participation Quality:  Attentive and Sharing  Affect:  Appropriate  Cognitive:  Appropriate  Insight:  Engaged  Engagement in Therapy:  Engaged  Modes of Intervention:  Discussion, Exploration, Rapport Building, Art therapist and Socialization  Summary of Progress/Problems: The main focus of today's process group was for the patient to identify ways in which they have in the past sabotaged their own recovery. Motivational Interviewing was utilized to ask the group members what they get out of their substance use, and what reasons they may have for wanting to change. The Stages of Change were explained using a handout, and patients identified where they currently are with regard to stages of change. The patient expressed that she self sabotages by being around people  Who also associate with drug use. She was able to process decisions which she has made that have not been in her own best interest. Patient reports she is in preparation stage as she is actively pursuing treatment as evidenced by her willingness to apply for two year treatment program.     Lyla Glassing
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
## 2014-10-31 NOTE — Progress Notes (Signed)
D: Pt has appropriate affect and anxious mood.  She reports she is "nervous about tomorrow, I have an interview for a place in [place]."  Pt reports she is also interested in a placement at [facility] in [place].  She reports she had a "good" day and that her goal is to "stay positive, be grateful."  Pt denies SI/HI, denies hallucinations, denies pain.  Pt has been visible in milieu interacting with peers and staff appropriately.  Pt attended evening group.   A: Introduced self to pt.  Met with pt 1:1 and provided support and encouragement.  Actively listened to pt.  Medications administered per order.  PRN medication administered for anxiety. R: Pt is compliant with medications.  Pt verbally contracts for safety.  Will continue to monitor and assess.

## 2014-10-31 NOTE — Plan of Care (Signed)
Problem: Alteration in mood & ability to function due to Goal: STG-Patient will attend groups Outcome: Progressing Pt attended evening group on 10/31/14. Goal: STG-Patient will comply with prescribed medication regimen (Patient will comply with prescribed medication regimen)  Outcome: Progressing Pt has been compliant with medications this shift.
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
## 2014-10-31 NOTE — Progress Notes (Signed)
Patient did attend the evening speaker AA meeting.  

## 2014-10-31 NOTE — Plan of Care (Signed)
Problem: Ineffective individual coping Goal: STG: Patient will participate in after care plan Outcome: Progressing [name] reports that she has an interview for a 2 year program for substance abuse.  "I am looking forward to the interview and hope that it will be positive."
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
## 2014-11-01 NOTE — Progress Notes (Signed)
Recreation Therapy Notes  Date: 09.26.2016 Time: 9:30am Location: [address] Group Room   Group Topic: Stress Management  Goal Area(s) Addresses:  Patient will actively participate in stress management techniques presented during session.   Behavioral Response: Did not attend.   Denise L Blanchfield, LRT/CTRS        Blanchfield, Denise L 11/01/2014 1:13 PM 

## 2014-11-01 NOTE — Progress Notes (Signed)
Patient attended AA group meeting tonight.  
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
## 2014-11-02 LAB — PREGNANCY, URINE: Preg Test, Ur: NEGATIVE
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
## 2014-11-02 NOTE — Progress Notes (Signed)
Pt attended the evening AA speaker meeting. 

## 2014-11-02 NOTE — Progress Notes (Signed)
[facility] Group Notes:  (Nursing/MHT/Case Management/Adjunct)  Date:  11/02/2014  Time:  9:50 AM  Type of Therapy:  Psychoeducational Skills  Participation Level:  Did Not Attend  Participation Quality:  Did not attend  Affect:  Did not attend  Cognitive:  Did not attend  Insight:  None  Engagement in Group:  Did not attend   Modes of Intervention:  Did not attend  Summary of Progress/Problems: Pt did not attend group. Pt was notified of group session multiple times. Pt remained in bed sleep.  Abe People Brittini 11/02/2014, 9:50 AM
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
## 2014-11-02 NOTE — Progress Notes (Signed)
DAR Note: Patient is calm and pleasant.  Report poor sleep disturbance.  Denies pain, visual and auditory hallucinations.  Rates depression at 2/10, hopelessness and anxiety at 0/10.  Maintained on routine safety checks.  Medications given as prescribed.  Reports goal for today is "going to the shelter."  Patient attended and participated in therapy.  Observed socializing with roommate and peers on the unit.

## 2014-11-03 MED ORDER — BUTALBITAL-APAP-CAFFEINE 50-325-40 MG PO TABS
2.0000 | ORAL_TABLET | Freq: Four times a day (QID) | ORAL | Status: DC | PRN
  Administered 2014-11-03 – 2014-11-04 (×4): 2 via ORAL
  Filled 2014-11-03 (×4): qty 2

## 2014-11-03 MED ORDER — NICOTINE 21 MG/24HR TD PT24: 21.0000 mg | MEDICATED_PATCH | Freq: Every day | TRANSDERMAL | Status: DC | PRN

## 2014-11-03 MED ORDER — AMPHETAMINE-DEXTROAMPHET ER 30 MG PO CP24: 30.0000 mg | ORAL_CAPSULE | ORAL | Status: DC

## 2014-11-03 MED ORDER — TRAZODONE HCL 50 MG PO TABS: 50.0000 mg | ORAL_TABLET | Freq: Every evening | ORAL | Status: DC | PRN

## 2014-11-03 MED ORDER — TRAMADOL HCL 50 MG PO TABS: 50.0000 mg | ORAL_TABLET | Freq: Four times a day (QID) | ORAL | Status: DC | PRN

## 2014-11-03 MED ORDER — HYDROXYZINE HCL 25 MG PO TABS: 25.0000 mg | ORAL_TABLET | Freq: Four times a day (QID) | ORAL | Status: DC | PRN

## 2014-11-03 MED ORDER — OXCARBAZEPINE 150 MG PO TABS: 150.0000 mg | ORAL_TABLET | Freq: Four times a day (QID) | ORAL | Status: DC

## 2014-11-03 MED ORDER — CLONAZEPAM 1 MG PO TABS
1.0000 mg | ORAL_TABLET | Freq: Once | ORAL | Status: AC
  Administered 2014-11-03: 1 mg via ORAL
  Filled 2014-11-03: qty 1

## 2014-11-03 MED ORDER — GABAPENTIN 300 MG PO CAPS: 600.0000 mg | ORAL_CAPSULE | Freq: Three times a day (TID) | ORAL | Status: DC

## 2014-11-03 MED ORDER — PAROXETINE HCL 20 MG PO TABS: 20.0000 mg | ORAL_TABLET | Freq: Every day | ORAL | Status: DC

## 2014-11-03 MED ORDER — PRAZOSIN HCL 1 MG PO CAPS: 1.0000 mg | ORAL_CAPSULE | Freq: Every day | ORAL | Status: DC

## 2014-11-03 NOTE — Progress Notes (Signed)
Pt attended NA group this evening.  

## 2014-11-03 NOTE — Plan of Care (Signed)
Problem: Alteration in mood Goal: LTG-Patient reports reduction in suicidal thoughts (Patient reports reduction in suicidal thoughts and is able to verbalize a safety plan for whenever patient is feeling suicidal)  Outcome: Progressing Pt denies SI at this time     
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
## 2014-11-03 NOTE — BHH Group Notes (Signed)
[facility] LCSW Aftercare Discharge Planning Group Note   11/03/2014 10:20 AM  Participation Quality:  DID NOT ATTEND-invited but chose to remain in bed.    Smart, [employer]

## 2014-11-03 NOTE — Plan of Care (Signed)
Problem: Ineffective individual coping Goal: LTG: Patient will report a decrease in negative feelings Outcome: Not Progressing Pt stated she was very anxious today  Problem: Alteration in mood Goal: LTG-Patient reports reduction in suicidal thoughts (Patient reports reduction in suicidal thoughts and is able to verbalize a safety plan for whenever patient is feeling suicidal)  Outcome: Progressing Pt denies SI at this time
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
## 2014-11-04 MED ORDER — NICOTINE 21 MG/24HR TD PT24: 21.0000 mg | MEDICATED_PATCH | Freq: Every day | TRANSDERMAL | Status: DC | PRN

## 2014-11-04 MED ORDER — HYDROXYZINE HCL 25 MG PO TABS: 25.0000 mg | ORAL_TABLET | Freq: Four times a day (QID) | ORAL | Status: DC | PRN

## 2014-11-04 MED ORDER — OXCARBAZEPINE 150 MG PO TABS: 150.0000 mg | ORAL_TABLET | Freq: Four times a day (QID) | ORAL | Status: DC

## 2014-11-04 MED ORDER — PRAZOSIN HCL 1 MG PO CAPS: 1.0000 mg | ORAL_CAPSULE | Freq: Every day | ORAL | Status: DC

## 2014-11-04 MED ORDER — TRAZODONE HCL 50 MG PO TABS: 50.0000 mg | ORAL_TABLET | Freq: Every evening | ORAL | Status: DC | PRN

## 2014-11-04 MED ORDER — GABAPENTIN 300 MG PO CAPS: 600.0000 mg | ORAL_CAPSULE | Freq: Three times a day (TID) | ORAL | Status: DC

## 2014-11-04 MED ORDER — PAROXETINE HCL 20 MG PO TABS: 20.0000 mg | ORAL_TABLET | Freq: Every day | ORAL | Status: DC

## 2014-11-04 MED ORDER — AMPHETAMINE-DEXTROAMPHET ER 30 MG PO CP24: 30.0000 mg | ORAL_CAPSULE | ORAL | Status: DC

## 2014-11-04 NOTE — Progress Notes (Signed)
Found patient resting in bed this morning. Complaining of migraine pain of a 10/10. States she had similar headache yesterday. Patient forwarding little information due to pain. She is cooperative, behavior appropriate and has no other physical complaints. Discussed with MD, tx team. Medicated per orders and fiorcet prn given for pain. On reassess, patient is asleep. Patient for likely discharge later today pending interview at 1300. Patient is aware of plan. Denies SI/HI and remains safe. [name]
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
## 2014-11-04 NOTE — Progress Notes (Signed)
Patient verbalizes readiness for discharge. D/C plan explained and Rx's given (written adderall Rx added on by Dr. Sabra Heck). All belongings returned. Bus pass given along with directions to bus stop. She verbalizes understanding. Denies SI/HI and assures this Probation officer she will seek assistance should that status change. Patient discharged ambulatory and in stable condition. [name]
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
## 2014-11-04 NOTE — BHH Group Notes (Signed)
[place] Group Notes:  (Nursing/MHT/Case Management/Adjunct)  Date:  11/04/2014  Time:  0900  Type of Therapy:  Nurse Education  Participation Level:  Did Not Attend  Participation Quality:    Affect:    Cognitive:    Insight:    Engagement in Group:    Modes of Intervention:    Summary of Progress/Problems: Patient invited to group however remained in bed as she indicates she has a migraine.  Loletta Specter [facility] 11/04/2014, 0930

## 2015-07-16 ENCOUNTER — Emergency Department (HOSPITAL_COMMUNITY)
Admission: EM | Admit: 2015-07-16 | Discharge: 2015-07-17 | Disposition: A | Discharge status: Other Acute Inpatient Hospital | Payer: Medicaid Other | Attending: Emergency Medicine | Admitting: Emergency Medicine

## 2015-07-16 ENCOUNTER — Encounter (HOSPITAL_COMMUNITY): Payer: Self-pay | Admitting: Emergency Medicine

## 2015-07-16 DIAGNOSIS — S8011XA Contusion of right lower leg, initial encounter: Secondary | ICD-10-CM | POA: Insufficient documentation

## 2015-07-16 DIAGNOSIS — R45851 Suicidal ideations: Secondary | ICD-10-CM | POA: Diagnosis not present

## 2015-07-16 DIAGNOSIS — F1910 Other psychoactive substance abuse, uncomplicated: Secondary | ICD-10-CM | POA: Insufficient documentation

## 2015-07-16 DIAGNOSIS — Y999 Unspecified external cause status: Secondary | ICD-10-CM | POA: Diagnosis not present

## 2015-07-16 DIAGNOSIS — S8012XA Contusion of left lower leg, initial encounter: Secondary | ICD-10-CM | POA: Diagnosis not present

## 2015-07-16 DIAGNOSIS — Y939 Activity, unspecified: Secondary | ICD-10-CM | POA: Diagnosis not present

## 2015-07-16 DIAGNOSIS — F17210 Nicotine dependence, cigarettes, uncomplicated: Secondary | ICD-10-CM | POA: Insufficient documentation

## 2015-07-16 DIAGNOSIS — Z79899 Other long term (current) drug therapy: Secondary | ICD-10-CM | POA: Diagnosis not present

## 2015-07-16 DIAGNOSIS — T07XXXA Unspecified multiple injuries, initial encounter: Secondary | ICD-10-CM

## 2015-07-16 DIAGNOSIS — Y929 Unspecified place or not applicable: Secondary | ICD-10-CM | POA: Diagnosis not present

## 2015-07-16 DIAGNOSIS — F319 Bipolar disorder, unspecified: Secondary | ICD-10-CM | POA: Insufficient documentation

## 2015-07-16 DIAGNOSIS — S60221A Contusion of right hand, initial encounter: Secondary | ICD-10-CM | POA: Insufficient documentation

## 2015-07-16 DIAGNOSIS — Z008 Encounter for other general examination: Secondary | ICD-10-CM | POA: Diagnosis present

## 2015-07-16 LAB — CBC WITH DIFFERENTIAL/PLATELET
BASOS ABS: 0 10*3/uL (ref 0.0–0.1)
Basophils Relative: 0 %
EOS PCT: 2 %
Eosinophils Absolute: 0.1 10*3/uL (ref 0.0–0.7)
HEMATOCRIT: 40.8 % (ref 36.0–46.0)
Hemoglobin: 13.5 g/dL (ref 12.0–15.0)
LYMPHS PCT: 21 %
Lymphs Abs: 1.2 10*3/uL (ref 0.7–4.0)
MCH: 26.8 pg (ref 26.0–34.0)
MCHC: 33.1 g/dL (ref 30.0–36.0)
MCV: 81.1 fL (ref 78.0–100.0)
MONO ABS: 0.6 10*3/uL (ref 0.1–1.0)
Monocytes Relative: 11 %
NEUTROS ABS: 3.7 10*3/uL (ref 1.7–7.7)
Neutrophils Relative %: 66 %
Platelets: 278 10*3/uL (ref 150–400)
RBC: 5.03 MIL/uL (ref 3.87–5.11)
RDW: 13.2 % (ref 11.5–15.5)
WBC: 5.7 10*3/uL (ref 4.0–10.5)

## 2015-07-16 LAB — RAPID URINE DRUG SCREEN, HOSP PERFORMED
AMPHETAMINES: NOT DETECTED
BARBITURATES: NOT DETECTED
BENZODIAZEPINES: NOT DETECTED
COCAINE: POSITIVE — AB
Opiates: POSITIVE — AB
TETRAHYDROCANNABINOL: POSITIVE — AB

## 2015-07-16 LAB — COMPREHENSIVE METABOLIC PANEL
ALBUMIN: 4.4 g/dL (ref 3.5–5.0)
ALT: 16 U/L (ref 14–54)
AST: 23 U/L (ref 15–41)
Alkaline Phosphatase: 53 U/L (ref 38–126)
Anion gap: 6 (ref 5–15)
BILIRUBIN TOTAL: 0.7 mg/dL (ref 0.3–1.2)
BUN: 17 mg/dL (ref 6–20)
CHLORIDE: 105 mmol/L (ref 101–111)
CO2: 26 mmol/L (ref 22–32)
CREATININE: 0.8 mg/dL (ref 0.44–1.00)
Calcium: 9.1 mg/dL (ref 8.9–10.3)
GFR calc Af Amer: >60 mL/min (ref >60)
GLUCOSE: 106 mg/dL — AB (ref 65–99)
Potassium: 3.5 mmol/L (ref 3.5–5.1)
Sodium: 137 mmol/L (ref 135–145)
TOTAL PROTEIN: 7.4 g/dL (ref 6.5–8.1)

## 2015-07-16 LAB — ETHANOL

## 2015-07-16 LAB — PREGNANCY, URINE: Preg Test, Ur: NEGATIVE

## 2015-07-16 MED ORDER — ACETAMINOPHEN 325 MG PO TABS
650.0000 mg | ORAL_TABLET | ORAL | Status: DC | PRN
  Administered 2015-07-17: 650 mg via ORAL
  Filled 2015-07-16: qty 2

## 2015-07-16 MED ORDER — ZOLPIDEM TARTRATE 5 MG PO TABS
5.0000 mg | ORAL_TABLET | Freq: Every evening | ORAL | Status: DC | PRN
  Administered 2015-07-16: 5 mg via ORAL
  Filled 2015-07-16: qty 1

## 2015-07-16 MED ORDER — LORAZEPAM 1 MG PO TABS
1.0000 mg | ORAL_TABLET | Freq: Three times a day (TID) | ORAL | Status: DC | PRN
Start: 2015-07-16 — End: 2015-07-17

## 2015-07-16 MED ORDER — IBUPROFEN 400 MG PO TABS
600.0000 mg | ORAL_TABLET | Freq: Three times a day (TID) | ORAL | Status: DC | PRN
  Administered 2015-07-16: 600 mg via ORAL
  Filled 2015-07-16: qty 2

## 2015-07-16 NOTE — Progress Notes (Signed)
Disposition CSW completed patient referrals to the following inpatient facilities:   [facility]  CSW will continue to monitor patient for placement needs.  [place] Disposition CSW [phone]

## 2015-07-16 NOTE — ED Notes (Signed)
Patient called out asking for sleep aid.
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
## 2015-07-16 NOTE — ED Provider Notes (Signed)
Patient not in room  Pattricia Boss, MD 07/16/15 1408
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
## 2015-07-17 ENCOUNTER — Inpatient Hospital Stay (HOSPITAL_COMMUNITY)
Admission: AD | Admit: 2015-07-17 | Discharge: 2015-07-25 | DRG: 885 | Disposition: A | Discharge status: Home / Self Care | Payer: Medicaid Other | Source: Intra-hospital | Attending: Psychiatry | Admitting: Psychiatry

## 2015-07-17 ENCOUNTER — Encounter (HOSPITAL_COMMUNITY): Payer: Self-pay

## 2015-07-17 DIAGNOSIS — F3163 Bipolar disorder, current episode mixed, severe, without psychotic features: Secondary | ICD-10-CM | POA: Diagnosis present

## 2015-07-17 DIAGNOSIS — F4310 Post-traumatic stress disorder, unspecified: Secondary | ICD-10-CM | POA: Diagnosis present

## 2015-07-17 DIAGNOSIS — Z8659 Personal history of other mental and behavioral disorders: Secondary | ICD-10-CM

## 2015-07-17 DIAGNOSIS — F909 Attention-deficit hyperactivity disorder, unspecified type: Secondary | ICD-10-CM | POA: Diagnosis present

## 2015-07-17 DIAGNOSIS — G4700 Insomnia, unspecified: Secondary | ICD-10-CM | POA: Diagnosis not present

## 2015-07-17 DIAGNOSIS — Z808 Family history of malignant neoplasm of other organs or systems: Secondary | ICD-10-CM

## 2015-07-17 DIAGNOSIS — Z915 Personal history of self-harm: Secondary | ICD-10-CM | POA: Diagnosis not present

## 2015-07-17 DIAGNOSIS — F250 Schizoaffective disorder, bipolar type: Secondary | ICD-10-CM | POA: Diagnosis present

## 2015-07-17 DIAGNOSIS — Z91410 Personal history of adult physical and sexual abuse: Secondary | ICD-10-CM

## 2015-07-17 DIAGNOSIS — F1210 Cannabis abuse, uncomplicated: Secondary | ICD-10-CM | POA: Diagnosis not present

## 2015-07-17 DIAGNOSIS — Z9114 Patient's other noncompliance with medication regimen: Secondary | ICD-10-CM

## 2015-07-17 DIAGNOSIS — G40909 Epilepsy, unspecified, not intractable, without status epilepticus: Secondary | ICD-10-CM | POA: Diagnosis present

## 2015-07-17 DIAGNOSIS — F410 Panic disorder [episodic paroxysmal anxiety] without agoraphobia: Secondary | ICD-10-CM | POA: Diagnosis present

## 2015-07-17 DIAGNOSIS — R45851 Suicidal ideations: Secondary | ICD-10-CM | POA: Diagnosis present

## 2015-07-17 DIAGNOSIS — F1110 Opioid abuse, uncomplicated: Secondary | ICD-10-CM | POA: Diagnosis present

## 2015-07-17 DIAGNOSIS — Z818 Family history of other mental and behavioral disorders: Secondary | ICD-10-CM

## 2015-07-17 DIAGNOSIS — F1410 Cocaine abuse, uncomplicated: Secondary | ICD-10-CM | POA: Clinically undetermined

## 2015-07-17 DIAGNOSIS — F419 Anxiety disorder, unspecified: Secondary | ICD-10-CM | POA: Diagnosis not present

## 2015-07-17 DIAGNOSIS — F17210 Nicotine dependence, cigarettes, uncomplicated: Secondary | ICD-10-CM | POA: Diagnosis present

## 2015-07-17 HISTORY — DX: Anxiety disorder, unspecified: F41.9

## 2015-07-17 MED ORDER — TRAZODONE HCL 50 MG PO TABS
50.0000 mg | ORAL_TABLET | Freq: Every day | ORAL | Status: DC
  Administered 2015-07-17: 50 mg via ORAL
  Filled 2015-07-17 (×3): qty 1

## 2015-07-17 MED ORDER — CLONIDINE HCL 0.1 MG PO TABS
0.1000 mg | ORAL_TABLET | Freq: Every day | ORAL | Status: AC
  Administered 2015-07-23: 0.1 mg via ORAL
  Filled 2015-07-17 (×2): qty 1

## 2015-07-17 MED ORDER — MAGNESIUM HYDROXIDE 400 MG/5ML PO SUSP: 30.0000 mL | Freq: Every day | ORAL | Status: DC | PRN

## 2015-07-17 MED ORDER — HYDROXYZINE HCL 25 MG PO TABS
25.0000 mg | ORAL_TABLET | Freq: Four times a day (QID) | ORAL | Status: AC | PRN
  Administered 2015-07-17 – 2015-07-19 (×5): 25 mg via ORAL
  Filled 2015-07-17 (×5): qty 1

## 2015-07-17 MED ORDER — ALUM & MAG HYDROXIDE-SIMETH 200-200-20 MG/5ML PO SUSP: 30.0000 mL | ORAL | Status: DC | PRN

## 2015-07-17 MED ORDER — DICYCLOMINE HCL 20 MG PO TABS: 20.0000 mg | ORAL_TABLET | Freq: Four times a day (QID) | ORAL | Status: AC | PRN

## 2015-07-17 MED ORDER — NICOTINE POLACRILEX 2 MG MT GUM
2.0000 mg | CHEWING_GUM | OROMUCOSAL | Status: DC | PRN
  Administered 2015-07-18 – 2015-07-25 (×19): 2 mg via ORAL
  Filled 2015-07-17 (×13): qty 1

## 2015-07-17 MED ORDER — NAPROXEN 500 MG PO TABS
500.0000 mg | ORAL_TABLET | Freq: Two times a day (BID) | ORAL | Status: AC | PRN
  Administered 2015-07-17 – 2015-07-19 (×4): 500 mg via ORAL
  Filled 2015-07-17 (×4): qty 1

## 2015-07-17 MED ORDER — ONDANSETRON 4 MG PO TBDP: 4.0000 mg | ORAL_TABLET | Freq: Four times a day (QID) | ORAL | Status: AC | PRN

## 2015-07-17 MED ORDER — CLONIDINE HCL 0.1 MG PO TABS
0.1000 mg | ORAL_TABLET | ORAL | Status: AC
  Filled 2015-07-17 (×4): qty 1

## 2015-07-17 MED ORDER — CLONIDINE HCL 0.1 MG PO TABS
0.1000 mg | ORAL_TABLET | Freq: Four times a day (QID) | ORAL | Status: AC
  Administered 2015-07-17 – 2015-07-19 (×6): 0.1 mg via ORAL
  Filled 2015-07-17 (×12): qty 1

## 2015-07-17 MED ORDER — NICOTINE 21 MG/24HR TD PT24
21.0000 mg | MEDICATED_PATCH | Freq: Every day | TRANSDERMAL | Status: DC
  Filled 2015-07-17: qty 1

## 2015-07-17 MED ORDER — LOPERAMIDE HCL 2 MG PO CAPS: 2.0000 mg | ORAL_CAPSULE | ORAL | Status: AC | PRN

## 2015-07-17 MED ORDER — METHOCARBAMOL 500 MG PO TABS
500.0000 mg | ORAL_TABLET | Freq: Three times a day (TID) | ORAL | Status: AC | PRN
  Administered 2015-07-17 – 2015-07-22 (×10): 500 mg via ORAL
  Filled 2015-07-17 (×10): qty 1

## 2015-07-17 NOTE — ED Notes (Signed)
Patient aware of admission, verbalized understanding. Voluntary consent signed. [facility] called. Patient given phone to call family.

## 2015-07-17 NOTE — Progress Notes (Signed)
Patient did not attend the evening speaker [place] meeting.  Pt was newly admitted to unit and was in bed sleeping.

## 2015-07-17 NOTE — ED Provider Notes (Signed)
Patient stable overnight.  Accepted to Behavioral health.  EMTALA for signed  Pattricia Boss, MD 07/17/15 1454
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
## 2015-07-17 NOTE — ED Notes (Signed)
Patient given cordless phone per request.

## 2015-07-17 NOTE — ED Notes (Signed)
Patient requesting something for pain, body aches-PRN orders given.

## 2015-07-17 NOTE — ED Notes (Signed)
Pt has dozed and awakened requesting a snack. Crackers provided, Pt out of bed to BR returned to bed and now resting with eyes closed in her R side

## 2015-07-18 ENCOUNTER — Encounter (HOSPITAL_COMMUNITY): Payer: Self-pay | Admitting: Psychiatry

## 2015-07-18 DIAGNOSIS — F4310 Post-traumatic stress disorder, unspecified: Secondary | ICD-10-CM

## 2015-07-18 DIAGNOSIS — F1110 Opioid abuse, uncomplicated: Secondary | ICD-10-CM | POA: Clinically undetermined

## 2015-07-18 DIAGNOSIS — Z8659 Personal history of other mental and behavioral disorders: Secondary | ICD-10-CM

## 2015-07-18 DIAGNOSIS — F3163 Bipolar disorder, current episode mixed, severe, without psychotic features: Secondary | ICD-10-CM | POA: Diagnosis present

## 2015-07-18 DIAGNOSIS — R45851 Suicidal ideations: Secondary | ICD-10-CM

## 2015-07-18 DIAGNOSIS — G40909 Epilepsy, unspecified, not intractable, without status epilepticus: Secondary | ICD-10-CM

## 2015-07-18 DIAGNOSIS — F1210 Cannabis abuse, uncomplicated: Secondary | ICD-10-CM

## 2015-07-18 DIAGNOSIS — F1410 Cocaine abuse, uncomplicated: Secondary | ICD-10-CM | POA: Clinically undetermined

## 2015-07-18 LAB — LIPID PANEL
CHOL/HDL RATIO: 3.5 ratio
Cholesterol: 155 mg/dL (ref 0–200)
HDL: 44 mg/dL (ref >40)
LDL CALC: 90 mg/dL (ref 0–99)
Triglycerides: 105 mg/dL (ref <150)
VLDL: 21 mg/dL (ref 0–40)

## 2015-07-18 MED ORDER — TRAZODONE HCL 150 MG PO TABS
150.0000 mg | ORAL_TABLET | Freq: Every day | ORAL | Status: DC
  Administered 2015-07-18: 150 mg via ORAL
  Filled 2015-07-18 (×3): qty 1

## 2015-07-18 MED ORDER — CARBAMAZEPINE ER 100 MG PO TB12
100.0000 mg | ORAL_TABLET | Freq: Two times a day (BID) | ORAL | Status: DC
  Administered 2015-07-18 – 2015-07-22 (×8): 100 mg via ORAL
  Filled 2015-07-18 (×12): qty 1

## 2015-07-18 MED ORDER — LORAZEPAM 1 MG PO TABS
1.0000 mg | ORAL_TABLET | Freq: Four times a day (QID) | ORAL | Status: DC | PRN
  Administered 2015-07-18 – 2015-07-25 (×17): 1 mg via ORAL
  Filled 2015-07-18 (×17): qty 1

## 2015-07-18 MED ORDER — PRAZOSIN HCL 1 MG PO CAPS
1.0000 mg | ORAL_CAPSULE | Freq: Every day | ORAL | Status: DC
  Administered 2015-07-18 – 2015-07-24 (×7): 1 mg via ORAL
  Filled 2015-07-18 (×9): qty 1

## 2015-07-18 MED ORDER — LORAZEPAM 2 MG/ML IJ SOLN: 1.0000 mg | Freq: Four times a day (QID) | INTRAMUSCULAR | Status: DC | PRN

## 2015-07-18 MED ORDER — PAROXETINE HCL 20 MG PO TABS
20.0000 mg | ORAL_TABLET | Freq: Every day | ORAL | Status: DC
  Administered 2015-07-18 – 2015-07-20 (×3): 20 mg via ORAL
  Filled 2015-07-18 (×5): qty 1

## 2015-07-18 MED ORDER — GABAPENTIN 300 MG PO CAPS
300.0000 mg | ORAL_CAPSULE | Freq: Three times a day (TID) | ORAL | Status: DC
  Administered 2015-07-18 – 2015-07-20 (×6): 300 mg via ORAL
  Filled 2015-07-18 (×11): qty 1

## 2015-07-18 NOTE — BHH Suicide Risk Assessment (Signed)
[facility] Admission Suicide Risk Assessment   Nursing information obtained from:    Demographic factors:    Current Mental Status:    Loss Factors:    Historical Factors:    Risk Reduction Factors:     Total Time spent with patient: 30 minutes Principal Problem: Bipolar disorder, curr episode mixed, severe, w/o psychotic features ([place]) Diagnosis:   Patient Active Problem List   Diagnosis Date Noted  . Bipolar disorder, curr episode mixed, severe, w/o psychotic features ([place]) [F31.63] 07/18/2015  . Cocaine use disorder, mild, abuse [F14.10] 07/18/2015  . Opioid use disorder, mild, abuse [F11.10] 07/18/2015  . History of ADHD [Z86.59] 07/18/2015  . BV (bacterial vaginosis) [N76.0, A49.9]   . Cannabis use disorder, mild, abuse [F12.10] 06/18/2014  . Seizure disorder ([place]) [id number] 06/18/2014  . PTSD (post-traumatic stress disorder) [F43.10] 06/16/2014   Subjective Data: Please see H&P.   Continued Clinical Symptoms:  Alcohol Use Disorder Identification Test Final Score (AUDIT): 0 The "Alcohol Use Disorders Identification Test", Guidelines for Use in Primary Care, Second Edition.  World Pharmacologist [facility]). Score between 0-7:  no or low risk or alcohol related problems. Score between 8-15:  moderate risk of alcohol related problems. Score between 16-19:  high risk of alcohol related problems. Score 20 or above:  warrants further diagnostic evaluation for alcohol dependence and treatment.   CLINICAL FACTORS:   Severe Anxiety and/or Agitation Panic Attacks Bipolar Disorder:   Mixed State Alcohol/Substance Abuse/Dependencies Unstable or Poor Therapeutic Relationship Previous Psychiatric Diagnoses and Treatments Medical Diagnoses and Treatments/Surgeries   Musculoskeletal: Strength & Muscle Tone: within normal limits Gait & Station: normal Patient leans: N/A  Psychiatric Specialty Exam: Physical Exam  Nursing note and vitals reviewed.   Review of Systems   Psychiatric/Behavioral: Positive for depression, suicidal ideas and substance abuse. The patient is nervous/anxious and has insomnia.   All other systems reviewed and are negative.   Blood pressure 108/56, pulse 70, temperature 97.8 F (36.6 C), temperature source Oral, resp. rate 16, height 5\' 5"  (1.651 m), weight 78 kg (171 lb 15.3 oz), last menstrual period 06/22/2015, SpO2 99 %.Body mass index is 28.62 kg/(m^2).                  Please see H&P.                                         COGNITIVE FEATURES THAT CONTRIBUTE TO RISK:  Closed-mindedness, Polarized thinking and Thought constriction (tunnel vision)    SUICIDE RISK:   Severe:  Frequent, intense, and enduring suicidal ideation, specific plan, no subjective intent, but some objective markers of intent (i.e., choice of lethal method), the method is accessible, some limited preparatory behavior, evidence of impaired self-control, severe dysphoria/symptomatology, multiple risk factors present, and few if any protective factors, particularly a lack of social support.  PLAN OF CARE: Please see H&P.   I certify that inpatient services furnished can reasonably be expected to improve the patient's condition.   Covey Baller, MD 07/18/2015, 11:54 AM

## 2015-07-18 NOTE — BHH Group Notes (Signed)
[facility] LCSW Aftercare Discharge Planning Group Note   07/18/2015 11:04 AM  Participation Quality:  Invited. DID NOT ATTEND. Pt chose to remain in bed.   Smart, Jonus Coble LCSW

## 2015-07-18 NOTE — Progress Notes (Signed)
Recreation Therapy Notes  Date: 06.12.2047 Time: 9:30am Location: [address] Hall Group Room   Group Topic: Stress Management  Goal Area(s) Addresses:  Patient will actively participate in stress management techniques presented during session.   Behavioral Response: Did not attend.   Laureen Ochs Aretta Stetzel, LRT/CTRS        Capone Schwinn L 07/18/2015 10:15 AM
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
## 2015-07-18 NOTE — Progress Notes (Signed)
D: Pt was isolative and withdrawn to room; endorsed moderate anxiety and depression. Pt also endorsed moderate generalized body pain. Pt at the time of assessment denied SI, HI or AVH. Pt was cooperative and pleasant. A: Medications offered as prescribed.  Support, encouragement, and safe environment provided.  15-minute safety checks continue. R: Pt was med compliant.  Pt did not attend AA group. Safety checks continue

## 2015-07-18 NOTE — Progress Notes (Signed)
Patient ID: [name], female   DOB: 1981-01-19, 35 y.o.   MRN: [id number]  DAR: Pt. Denies SI/HI and A/V Hallucinations to writer during assessment. However on patient's self inventory she reports SI sometimes, she is able to contract for safety. Patient does report pain that is generalized and muscle spasms in back from time to time. She is receiving PRN medication for this. She reports sleep is poor, energy level is low, appetite is fair, and concentration is poor. She rates depression, anxiety, and hopelessness 10/10. Support and encouragement provided to the patient. Scheduled medications administered to patient per physician's orders. Patient is minimal and isolated to her room for most of the day. However, she did come into the milieu more as the evening continued on. She is seen speaking to a female peer in the day room this evening and remains appropriate in the milieu. Plan of care is continued. Q15 minute checks are maintained for safety.

## 2015-07-19 LAB — HEMOGLOBIN A1C
Hgb A1c MFr Bld: 5.2 % (ref 4.8–5.6)
Mean Plasma Glucose: 103 mg/dL

## 2015-07-19 LAB — PROLACTIN: Prolactin: 26.2 ng/mL — ABNORMAL HIGH (ref 4.8–23.3)

## 2015-07-19 MED ORDER — QUETIAPINE FUMARATE 25 MG PO TABS
25.0000 mg | ORAL_TABLET | Freq: Three times a day (TID) | ORAL | Status: DC | PRN
Start: 2015-07-19 — End: 2015-07-22
  Administered 2015-07-19 – 2015-07-22 (×7): 25 mg via ORAL
  Filled 2015-07-19 (×7): qty 1

## 2015-07-19 MED ORDER — TRAZODONE HCL 100 MG PO TABS
200.0000 mg | ORAL_TABLET | Freq: Every day | ORAL | Status: DC
  Administered 2015-07-19 – 2015-07-21 (×3): 200 mg via ORAL
  Filled 2015-07-19 (×7): qty 2
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
## 2015-07-19 NOTE — Progress Notes (Signed)
Patient ID: [name], female   DOB: Aug 20, 1980, 35 y.o.   MRN: [id number]  DAR: Pt. Denies HI and A/V Hallucinations. She does report passive SI but is able to contract for safety. She reports sleep is poor, appetite is fair, energy level is low, and concentration is poor. She rates depression, anxiety, and hopelessness 10/10. Patient continues to report mild withdrawal symptoms including anxiety and chills and pain which she is receiving PRN medication for. Support and encouragement provided to the patient. Scheduled medications administered to patient per physician's orders. Patient is minimal during the morning and stays mostly in her room but as the day goes on she is seen more in the milieu. She can be medication seeking at times. Q15 minute checks are maintained for safety.

## 2015-07-19 NOTE — Progress Notes (Signed)
Referral to [facility] faxed today. Pt encouraged to call them at [phone] this afternoon to check status of referral and complete phone screening if necessary.   Maxie Better, MSW, LCSW Clinical Social Worker 07/19/2015 11:10 AM
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
## 2015-07-19 NOTE — BHH Group Notes (Signed)
[facility] Group Notes:  (Nursing/MHT/Case Management/Adjunct)  Date:  07/19/2015  Time:  4:26 PM  Type of Therapy:  Psychoeducational Skills  Participation Level:  Did Not Attend  Participation Quality:  N/A  Affect:  N/A  Cognitive:  N/A  Insight:  None  Engagement in Group:  None  Modes of Intervention:  Discussion and Education  Summary of Progress/Problems: Patient was invited to group but did not attend.

## 2015-07-19 NOTE — Progress Notes (Signed)
Recreation Therapy Notes  Animal-Assisted Activity (AAA) Program Checklist/Progress Notes Patient Eligibility Criteria Checklist & Daily Group note for Rec Tx Intervention  Date: 06.13.2017 Time: 2:45pm Location: [address]    AAA/T Program Assumption of Risk Form signed by Patient/ or Parent Legal Guardian Yes  Patient is free of allergies or sever asthma Yes  Patient reports no fear of animals Yes  Patient reports no history of cruelty to animals Yes  Patient understands his/her participation is voluntary Yes  Patient washes hands before animal contact Yes  Patient washes hands after animal contact Yes  Behavioral Response: Engaged, Attentive  Education: Contractor, Appropriate Animal Interaction   Education Outcome: Acknowledges education.   Clinical Observations/Feedback: Patient interacted appropriately with therapy dog, petting him appropriately and asking appropriate questions about therapy dog and his training. Additionally patient interacted appropriately with peers during session.  [name] [place] Youngblood, LRT/CTRS        Cledith Kamiya L 07/19/2015 3:03 PM

## 2015-07-19 NOTE — BHH Group Notes (Signed)
[place] LCSW Group Therapy  07/19/2015 11:36 AM  Type of Therapy:  Group Therapy  Participation Level:  Active  Participation Quality:  Attentive  Affect:  Appropriate  Cognitive:  Alert and Oriented  Insight:  Engaged  Engagement in Therapy:  Improving  Modes of Intervention:  Confrontation, Discussion, Education, Exploration, Problem-solving, Rapport Building, Socialization and Support  Summary of Progress/Problems: [facility] Speaker came to talk about his personal journey with substance abuse and addiction. The pt processed ways by which to relate to the speaker. [place] speaker provided handouts and educational information pertaining to groups and services offered by the [facility]. CSW PROVIDED [employer] PAMPHLET AND REVIEWED INFORMATION/SUPPORT GROUPS WITH PATIENTS. CSW ALSO PROVIDED PATIENTS WITH IRC/PATH PROGRAM INFORMATION, SHELTER INFORMATION, AND OTHER RESOURCES FOR AA/NA/NAMI. Sherian was attentive and engaged during today's processing group. She was receptive to information and resources provided. Jamyah shared that she completed phone screening with [facility] and has to speak with the MD about changing some medications.   Smart, Jobeth Pangilinan LCSW 07/19/2015, 11:36 AM

## 2015-07-19 NOTE — BHH Group Notes (Signed)
Adult Psychoeducational Group Note  Date:  07/19/2015 Time:  10:03 PM  Group Topic/Focus:  AA Meeting  Participation Level:  Minimal  Participation Quality:  Attentive  Affect:  Appropriate  Cognitive:  Alert  Insight: Limited  Engagement in Group:  Limited  Modes of Intervention:  Discussion and Education  Additional Comments:  Pt attended [place] meeting.  [name] A 07/19/2015, 10:03 PM

## 2015-07-20 MED ORDER — PAROXETINE HCL 20 MG PO TABS
30.0000 mg | ORAL_TABLET | Freq: Every day | ORAL | Status: DC
  Administered 2015-07-21 – 2015-07-25 (×5): 30 mg via ORAL
  Filled 2015-07-20 (×6): qty 1

## 2015-07-20 MED ORDER — GABAPENTIN 400 MG PO CAPS
400.0000 mg | ORAL_CAPSULE | Freq: Three times a day (TID) | ORAL | Status: DC
  Administered 2015-07-20 – 2015-07-22 (×7): 400 mg via ORAL
  Filled 2015-07-20 (×12): qty 1

## 2015-07-20 NOTE — Progress Notes (Signed)
D: Patient continues to have passive SI.  She contracts for safety on the unit.  She rates her depression, anxiety and hopelessness as a 10.  She reports minimal withdrawal symptoms.  She is more visible on the unit today.  She has flat, blunted affect with depressed mood.  She is attending groups.  She is sleeping fair; her energy and concentration are poor.  She denies HI/AVH. A: Continue to monitor medication management and MD orders.  Safety checks completed every 15 minutes per protocol. Offer support and encouragement as needed. R: Patient is receptive to staff; her behavior is appropriate.

## 2015-07-20 NOTE — Progress Notes (Signed)
Patient attended N/A group tonight.  

## 2015-07-20 NOTE — BHH Group Notes (Signed)
[place] LCSW Group Therapy  07/20/2015 11:29 AM  Type of Therapy:  Group Therapy  Participation Level:  Minimal  Participation Quality:  Drowsy  Affect:  Blunted  Cognitive:  Lacking  Insight:  Limited  Engagement in Therapy:  Lacking  Modes of Intervention:  Confrontation, Discussion, Education, Exploration, Problem-solving, Rapport Building, Socialization and Support  Summary of Progress/Problems: Today's Topic: Overcoming Obstacles. Patients identified one short term goal and potential obstacles in reaching this goal. Patients processed barriers involved in overcoming these obstacles. Patients identified steps necessary for overcoming these obstacles and explored motivation (internal and external) for facing these difficulties head on. Addysen was drowsy during today's group. She stated that she is struggling with low BP and dizziness. "I can't focus on anything but that right now." At this time, she is not making progress in the group setting.   Smart, Tinia Oravec  LCSW  07/20/2015, 11:29 AM
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
## 2015-07-20 NOTE — Plan of Care (Signed)
Problem: Self-Concept: Goal: Level of anxiety will decrease Outcome: Not Progressing Patient continues to voice severe anxiety.

## 2015-07-20 NOTE — BHH Group Notes (Signed)
[facility] LCSW Aftercare Discharge Planning Group Note   07/20/2015 10:45 AM  Participation Quality:  Invited. DID NOT ATTEND. Pt chose to rest in room.   Smart, Winnona Wargo LCSW

## 2015-07-20 NOTE — Progress Notes (Signed)
D. Pt has been up and visible in milieu this evening, attended and participated in group activity. Pt spoke about going to a [place] based shelter when she leaves from here and spoke about getting as far away as possible from her husband as possible. Pt did endorse some pain and anxiety and did receive all bedtime medications without incident. A. Support and encouragement provided. R. Safety maintained, will continue to monitor.

## 2015-07-20 NOTE — Progress Notes (Signed)
Recreation Therapy Notes  Date: 06.14.2017 Time: 9:30am Location: 300 Hall Group Room   Group Topic: Stress Management  Goal Area(s) Addresses:  Patient will actively participate in stress management techniques presented during session.   Behavioral Response: Did not attend.   Laureen Ochs Derreck Wiltsey, LRT/CTRS        Lane Hacker 07/20/2015 1:43 PM

## 2015-07-21 DIAGNOSIS — G4700 Insomnia, unspecified: Secondary | ICD-10-CM

## 2015-07-21 DIAGNOSIS — F419 Anxiety disorder, unspecified: Secondary | ICD-10-CM

## 2015-07-21 LAB — CARBAMAZEPINE LEVEL, TOTAL: Carbamazepine Lvl: 3 ug/mL — ABNORMAL LOW (ref 4.0–12.0)

## 2015-07-21 NOTE — BHH Suicide Risk Assessment (Signed)
[facility] INPATIENT:  Family/Significant Other Suicide Prevention Education  Suicide Prevention Education:  Patient Refusal for Family/Significant Other Suicide Prevention Education: The patient [name] has refused to provide written consent for family/significant other to be provided Family/Significant Other Suicide Prevention Education during admission and/or prior to discharge.  Physician notified.  SPE completed with pt, as pt refused to consent to family contact. SPI pamphlet provided to pt and pt was encouraged to share information with support network, ask questions, and talk about any concerns relating to SPE. Pt denies access to guns/firearms and verbalized understanding of information provided. Mobile Crisis information also provided to pt.   Smart, Brailyn Killion LCSW 07/21/2015, 11:55 AM
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
## 2015-07-21 NOTE — Progress Notes (Signed)
D: Patient continues to endorse passive SI.  She contracts for safety on the unit.  She rates her depression as a 10; hopelessness and anxiety as a 9.  She denies HI/AVH.  Patient's goal today is to "try to manage stress, hopelessness and anxiety.  Patient requests her ativan and seroquel together due to severe anxiety.  She has been interacting with peers; she has been observed laughing and joking around with one of the female peers on the unit. A: Continue to monitor medication management and MD orders.  Safety checks completed every 15 minutes per protocol. Offer support and encouragement as needed. R: Patient is receptive to staff; her behavior is appropriate.

## 2015-07-21 NOTE — Progress Notes (Signed)
CSW spoke with admissions office at [facility]. They are holding her bed but need her to be off trazodone and neurontin. No sleep medications allowed and many psychiatric meds are not allowed. CSW to notify MD/NP.   Maxie Better, MSW, LCSW Clinical Social Worker 07/21/2015 1:02 PM

## 2015-07-21 NOTE — BHH Group Notes (Signed)
[place] LCSW Group Therapy  07/21/2015 3:46 PM  Type of Therapy:  Group Therapy  Participation Level:  Active  Participation Quality:  Attentive   Affect:  Appropriate  Cognitive:  Alert and Oriented  Insight:  Improving  Engagement in Therapy:  Improving  Modes of Intervention:  Confrontation, Discussion, Education, Exploration, Problem-solving, Rapport Building, Socialization and Support  Summary of Progress/Problems: [facility] Speaker came to talk about his personal journey with substance abuse and addiction. The pt processed ways by which to relate to the speaker. Miles speaker provided handouts and educational information pertaining to groups and services offered by the [facility].   Smart, Hesham Womac LCSW 07/21/2015, 3:46 PM

## 2015-07-21 NOTE — Progress Notes (Signed)
Adult Psychoeducational Group Note  Date:  07/21/2015 Time:  10:26 PM  Group Topic/Focus:  Wrap-Up Group:   The focus of this group is to help patients review their daily goal of treatment and discuss progress on daily workbooks.  Participation Level:  Active  Participation Quality:  Appropriate  Affect:  Appropriate  Cognitive:  Alert  Insight: Appropriate  Engagement in Group:  Engaged  Modes of Intervention:  Discussion  Additional Comments:  Patient states, "my day was better". Patient goal for the day was to get out of bed. Patient met goal.   Chelcy Bolda L Berdie Malter 07/21/2015, 10:26 PM
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
## 2015-07-22 MED ORDER — CARBAMAZEPINE ER 200 MG PO TB12
200.0000 mg | ORAL_TABLET | Freq: Every day | ORAL | Status: DC
  Administered 2015-07-22 – 2015-07-24 (×3): 200 mg via ORAL
  Filled 2015-07-22 (×4): qty 1

## 2015-07-22 MED ORDER — CARBAMAZEPINE ER 100 MG PO TB12
100.0000 mg | ORAL_TABLET | Freq: Every morning | ORAL | Status: DC
  Administered 2015-07-23 – 2015-07-25 (×3): 100 mg via ORAL
  Filled 2015-07-22 (×3): qty 1
  Filled 2015-07-22: qty 90
  Filled 2015-07-22: qty 1

## 2015-07-22 MED ORDER — QUETIAPINE FUMARATE 25 MG PO TABS
25.0000 mg | ORAL_TABLET | Freq: Three times a day (TID) | ORAL | Status: DC | PRN
  Administered 2015-07-22 – 2015-07-23 (×4): 25 mg via ORAL
  Filled 2015-07-22 (×5): qty 1
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
## 2015-07-22 NOTE — Progress Notes (Signed)
Recreation Therapy Notes  Date: 06.16.2017 Time: 9:30am Location: 300 Hall Group Room   Group Topic: Stress Management  Goal Area(s) Addresses:  Patient will actively participate in stress management techniques presented during session.   Behavioral Response: Did not attend.   Laureen Ochs Ransom Nickson, LRT/CTRS        Makya Yurko L 07/22/2015 10:26 AM

## 2015-07-22 NOTE — BHH Group Notes (Signed)
[place] LCSW Group Therapy  07/22/2015 1:29 PM  Type of Therapy:  Group Therapy  Participation Level:  Did Not Attend-pt sleeping in room. Asked to remain in bed. Reports BP is still low, causing low energy and some dizziness.   Modes of Intervention:  Confrontation, Discussion, Education, Exploration, Problem-solving, Rapport Building, Socialization and Support  Summary of Progress/Problems: Feelings around Relapse. Group members discussed the meaning of relapse and shared personal stories of relapse, how it affected them and others, and how they perceived themselves during this time. Group members were encouraged to identify triggers, warning signs and coping skills used when facing the possibility of relapse. Social supports were discussed and explored in detail. Post Acute Withdrawal Syndrome (handout provided) was introduced and examined. Pt's were encouraged to ask questions, talk about key points associated with PAWS, and process this information in terms of relapse prevention.   Smart, Jadalyn Oliveri LCSW 07/22/2015, 1:29 PM

## 2015-07-22 NOTE — Progress Notes (Signed)
Patient did attend the evening speaker AA meeting.  
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
## 2015-07-22 NOTE — Progress Notes (Signed)
Patient has remained in bed most of the day, even for lunch. Continues to complain of back pain and anxiety though is observed sleeping at intervals. Affect anxious with congruent mood. Minimal information forwarded.   Medicated per orders and education given. Seroquel and ativan prn given per her request to manage anxiety. Emotional support offered. Encouraged to complete self inventory, to push fluids as BP was low and fall precautions reviewed. Also encouraged to participate in activities.   Patient verbalizes understanding. Observed ambulating and gait is stead. Prn's helping with anxiety. She denies SI/HI and remains safe on level III obs.

## 2015-07-22 NOTE — BHH Group Notes (Signed)
[facility] LCSW Aftercare Discharge Planning Group Note   07/22/2015 9:17 AM  Participation Quality:  Invited. DID NOT ATTEND. Pt chose to remain in bed this morning.   Smart, Vandell Kun LCSW

## 2015-07-22 NOTE — Plan of Care (Signed)
Problem: Activity: Goal: Interest or engagement in leisure activities will improve Outcome: Not Progressing Patient has been resting in bed all day.  Problem: Education: Goal: Knowledge of the prescribed therapeutic regimen will improve Outcome: Progressing Receptive to med education which was provided.

## 2015-07-23 ENCOUNTER — Encounter (HOSPITAL_COMMUNITY): Payer: Self-pay | Admitting: Registered Nurse

## 2015-07-23 MED ORDER — QUETIAPINE FUMARATE 25 MG PO TABS
25.0000 mg | ORAL_TABLET | Freq: Two times a day (BID) | ORAL | Status: DC | PRN
Start: 2015-07-23 — End: 2015-07-25
  Administered 2015-07-24 – 2015-07-25 (×2): 25 mg via ORAL
  Filled 2015-07-23 (×3): qty 1

## 2015-07-23 MED ORDER — QUETIAPINE FUMARATE 50 MG PO TABS
50.0000 mg | ORAL_TABLET | Freq: Every day | ORAL | Status: DC
  Administered 2015-07-23 – 2015-07-24 (×2): 50 mg via ORAL
  Filled 2015-07-23 (×4): qty 1

## 2015-07-23 NOTE — Progress Notes (Signed)
D: Pt is alert and oriented x4. Pt with endorsed severe anxiety and depression; states, "some of the medications I still need have been stopped; I don't know if I will be able to sleep now; Pt could be med seeking." Pt denied HI, SI, AVH and pain.  A: Medications offered as prescribed.  Support, encouragement, and safe environment provided.  15-minute safety checks continue. R: Pt was med compliant.  Pt attended group. Safety checks continue.
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
## 2015-07-23 NOTE — Progress Notes (Signed)
Patient did attend the evening speaker [place] meeting. Pt exited group 15 min before it was over and returned to room.
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
## 2015-07-23 NOTE — Progress Notes (Signed)
Nursing Progress Note: 7-7p  D- Mood is depressed and anxious,rates anxiety at 1010.Pt c/o med's are not enough and would like Seroquel increased. " You know they changed my medication because I'm going to a program that won't let me have them. I didn't sleep last night without my trazodone.' Affect is blunted and appropriate. Pt is able to contract for safety. Continues to have difficulty staying asleep. Goal for today is work on discharge plan and get med's increased.  A - Observed pt interacting with select female peer and in the milieu.Support and encouragement offered, safety maintained with Q 15 minute.   R-Contracts for safety and continues to follow treatment plan, working on learning new coping skills.

## 2015-07-24 NOTE — BHH Group Notes (Signed)
[facility] Group Notes:  (Nursing/MHT/Case Management/Adjunct)  Date:  07/24/2015  Time:  4:34 PM  Type of Therapy:  Psychoeducational Skills  Participation Level:  Active  Participation Quality:  Appropriate  Affect:  Appropriate  Cognitive:  Appropriate  Insight:  Appropriate  Engagement in Group:  Engaged  Modes of Intervention:  Problem-solving  Summary of Progress/Problems:  Discussed the important of choosing healthy leisure activities. Group encouraged to surround themselves with positive and healthy group/support system when changing to a healthy life style.   [name] 07/24/2015, 4:34 PM

## 2015-07-24 NOTE — Progress Notes (Signed)
Patient did attend the first half of the evening speaker [place] meeting.

## 2015-07-24 NOTE — Progress Notes (Signed)
Patient ID: [name], female   DOB: 1980-02-11, 35 y.o.   MRN: [id number] [facility] MD Progress Note  07/24/2015 1:44 PM [name]  MRN:  [id number]  Subjective:  "I feel a lot better to day; but still feel like my paxil needs to be increasedl"  Objective :  Patient seen by this provider and chart reviewed 07/24/2015.  On evaluation:  [name] reports that Seroquel helped with anxiety and irritability; States that depression is better.  Patient has concerns about transportation to [employer].  States that she has spoken to Education officer, museum but no one has gotten back to her about assissting her getting there.  Reports that she is eating and sleeping without difficulty; and tolerating medications without adverse reactions. At this time patient denies suicidal ideation; psychosis, and paranoia    Principal Problem: Bipolar disorder, curr episode mixed, severe, w/o psychotic features ([place])  Diagnosis:   Patient Active Problem List   Diagnosis Date Noted  . Bipolar disorder, curr episode mixed, severe, w/o psychotic features ([place]) [F31.63] 07/18/2015  . Cocaine use disorder, mild, abuse [F14.10] 07/18/2015  . Opioid use disorder, mild, abuse [F11.10] 07/18/2015  . History of ADHD [Z86.59] 07/18/2015  . BV (bacterial vaginosis) [N76.0, A49.9]   . Cannabis use disorder, mild, abuse [F12.10] 06/18/2014  . Seizure disorder ([place]) [id number] 06/18/2014  . PTSD (post-traumatic stress disorder) [F43.10] 06/16/2014   Total Time spent with patient: 15 minutes   Past Psychiatric History: see Admission H and P  Past Medical History:  Past Medical History  Diagnosis Date  . Bipolar 1 disorder ([place])   . Depression   . PTSD (post-traumatic stress disorder)   . ADD (attention deficit disorder)   . ADHD (attention deficit hyperactivity disorder)   . Bipolar depression ([place])   . Arthritis   . Degenerative disc disease   . Anxiety    History reviewed. No pertinent past surgical  history.  Family History:  Family History  Problem Relation Age of Onset  . Cancer Father   . Cancer Mother   . Mental illness Mother    Family Psychiatric  History: H&P  Social History:  History  Alcohol Use No    Comment: occasionally     History  Drug Use  . Yes  . Special: Marijuana, Cocaine    Comment: admitted relapsing to use cocaine, Suboxone, and pot    Social History   Social History  . Marital Status: Married    Spouse Name: N/A  . Number of Children: N/A  . Years of Education: N/A   Social History Main Topics  . Smoking status: Current Every Day Smoker -- 0.50 packs/day for 17 years    Types: Cigarettes  . Smokeless tobacco: Never Used     Comment: last use 1.5 years ago.  . Alcohol Use: No     Comment: occasionally  . Drug Use: Yes    Special: Marijuana, Cocaine     Comment: admitted relapsing to use cocaine, Suboxone, and pot  . Sexual Activity: Yes    Birth Control/ Protection: IUD   Other Topics Concern  . None   Social History Narrative   Additional Social History:   Sleep: Good  Appetite:  Good  Current Medications: Current Facility-Administered Medications  Medication Dose Route Frequency Provider Last Rate Last Dose  . alum & mag hydroxide-simeth (MAALOX/MYLANTA) 200-200-20 MG/5ML suspension 30 mL  30 mL Oral Q4H PRN Encarnacion Slates, NP      .  carbamazepine (TEGRETOL XR) 12 hr tablet 100 mg  100 mg Oral q morning - 10a Jenne Campus, MD   100 mg at 07/24/15 0950  . carbamazepine (TEGRETOL XR) 12 hr tablet 200 mg  200 mg Oral QHS Jenne Campus, MD   200 mg at 07/23/15 2117  . LORazepam (ATIVAN) tablet 1 mg  1 mg Oral Q6H PRN Ursula Alert, MD   1 mg at 07/24/15 0950   Or  . LORazepam (ATIVAN) injection 1 mg  1 mg Intramuscular Q6H PRN Saramma Eappen, MD      . magnesium hydroxide (MILK OF MAGNESIA) suspension 30 mL  30 mL Oral Daily PRN Encarnacion Slates, NP      . nicotine polacrilex (NICORETTE) gum 2 mg  2 mg Oral PRN Derrill Center, NP   2 mg at 07/24/15 0825  . PARoxetine (PAXIL) tablet 30 mg  30 mg Oral Daily Kerrie Buffalo, NP   30 mg at 07/24/15 0825  . prazosin (MINIPRESS) capsule 1 mg  1 mg Oral QHS Ursula Alert, MD   1 mg at 07/23/15 2117  . QUEtiapine (SEROQUEL) tablet 25 mg  25 mg Oral BID PRN Shuvon B Rankin, NP   25 mg at 07/24/15 0825  . QUEtiapine (SEROQUEL) tablet 50 mg  50 mg Oral QHS Shuvon B Rankin, NP   50 mg at 07/23/15 2117    Lab Results:  No results found for this or any previous visit (from the past 48 hour(s)). Physical Findings: AIMS: Facial and Oral Movements Muscles of Facial Expression: None, normal Lips and Perioral Area: None, normal Jaw: None, normal Tongue: None, normal,Extremity Movements Upper (arms, wrists, hands, fingers): None, normal Lower (legs, knees, ankles, toes): None, normal, Trunk Movements Neck, shoulders, hips: None, normal, Overall Severity Severity of abnormal movements (highest score from questions above): None, normal Incapacitation due to abnormal movements: None, normal Patient's awareness of abnormal movements (rate only patient's report): No Awareness, Dental Status Current problems with teeth and/or dentures?: No Does patient usually wear dentures?: No  CIWA:    COWS:  COWS Total Score: 3  Musculoskeletal: Strength & Muscle Tone: within normal limits Gait & Station: normal Patient leans: normal  Psychiatric Specialty Exam: Review of Systems  Psychiatric/Behavioral: Positive for depression. The patient is nervous/anxious.   All other systems reviewed and are negative. No seizures on unit   Blood pressure 91/48, pulse 91, temperature 97.6 F (36.4 C), temperature source Oral, resp. rate 16, height 5\' 5"  (1.651 m), weight 78 kg (171 lb 15.3 oz), last menstrual period 06/22/2015, SpO2 99 %.Body mass index is 28.62 kg/(m^2).  General Appearance: improved groooming   Eye Contact::  Good   Speech:  Clear and Coherent  Volume:  Normal  Mood: less  depressed   Affect:   Appropriate , more reactive   Thought Process:  Goal Directed  Orientation:  Full (Time, Place, and Person)  Thought Content:  Denies hallucinations, delusions, and does not appear to be  internally preoccupied   Suicidal Thoughts:  No  Homicidal Thoughts:  No  Memory:   Recent and remote grossly intact   Judgement:  Improving   Insight:  Present  Psychomotor Activity:  Normal  Concentration:  Good  Recall:  Good  Fund of Knowledge:Good  Language: Good  Akathisia:  No  Handed:  Right  AIMS (if indicated):     Assets:  Desire for Improvement  ADL's:  Intact  Cognition: WNL  Sleep:  Number of  Hours: 6   Treatment Plan Summary: Daily contact with patient to assess and evaluate symptoms and progress in treatment and Medication management Continue to encourage group and milieu participation  Continue to encourage efforts to work on sobriety and relapse prevention  D/C Neurontin D/C Trazodone - see rationale above  Increase Tegretol to 100 mgr QAM and 200 mgrs QHS  Continue Hydroxyzine 25 mgrs Q 6 hours PRN for anxiety as needed  Continue Minipress 1 mgr QHS for PTSD related nightmares  Mood Control:  Changed Seroquel 25 mg Bid prn, and increased 50 mg Q hs . Continue Paxil 30 mgrs QHS for depression, PTSD , and anxiety Patient interested in going to [facility]   Rankin, [place],  07/24/2015, 1:44 PM I agreed with findings and treatment plan of this patient

## 2015-07-24 NOTE — Progress Notes (Signed)
D: Pt presents with depressed affect and mood.  She describes her day as "a little depressing."  Pt reports "my dad's been dead a couple years, no mom, no dad."  Pt denies SI/HI, denies hallucinations.  Pt has been interacting appropriately with staff and peers.  She attended part of evening group.  Pt reports she is discharging tomorrow.  She reports she feels safe to discharge.    A: Actively listened to pt and offered support and encouragement.  Medication administered per order.  Safety maintained on Q15 minute checks.    R: Pt is compliant with medications.  She verbally contracts for safety.  Will continue to monitor and assess.
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
## 2015-07-24 NOTE — Progress Notes (Signed)
D: Pt presents with anxious affect and mood.  She was in the hallway upon initial approach.  Pt reports she is "doing better."  Pt reports the best part of her day was "seeing my son just now."  Her reported goal is to "get out of bed, go to every group possible which I have."  Pt denies SI/HI, denies hallucinations.  She has been visible in the milieu interacting with peers and staff appropriately.  Pt attended evening group.  A: Introduced self to pt.  Actively listened to pt and offered support and encouragement.  Medication administered per order.  PRN medication administered for severe anxiety.  PO fluids encouraged and provided.  R: Pt is compliant with medications.  She verbally contracts for safety.  Will continue to monitor and assess.

## 2015-07-25 MED ORDER — NICOTINE 21 MG/24HR TD PT24
21.0000 mg | MEDICATED_PATCH | Freq: Every day | TRANSDERMAL | Status: DC
  Filled 2015-07-25 (×2): qty 30

## 2015-07-25 MED ORDER — CARBAMAZEPINE ER 100 MG PO TB12: ORAL_TABLET | ORAL | Status: DC

## 2015-07-25 MED ORDER — QUETIAPINE FUMARATE 50 MG PO TABS
25.0000 mg | ORAL_TABLET | Freq: Two times a day (BID) | ORAL | Status: DC
  Filled 2015-07-25 (×2): qty 60

## 2015-07-25 MED ORDER — QUETIAPINE FUMARATE 50 MG PO TABS: ORAL_TABLET | ORAL | Status: DC

## 2015-07-25 MED ORDER — ACETAMINOPHEN ER 650 MG PO TBCR: 650.0000 mg | EXTENDED_RELEASE_TABLET | Freq: Three times a day (TID) | ORAL | Status: DC | PRN

## 2015-07-25 MED ORDER — ACETAMINOPHEN 325 MG PO TABS
650.0000 mg | ORAL_TABLET | Freq: Four times a day (QID) | ORAL | Status: DC | PRN
  Administered 2015-07-25: 650 mg via ORAL
  Filled 2015-07-25: qty 2

## 2015-07-25 MED ORDER — NICOTINE 21 MG/24HR TD PT24: 21.0000 mg | MEDICATED_PATCH | Freq: Every day | TRANSDERMAL | Status: DC | PRN

## 2015-07-25 MED ORDER — PAROXETINE HCL 30 MG PO TABS: 30.0000 mg | ORAL_TABLET | Freq: Every day | ORAL | Status: DC

## 2015-07-25 MED ORDER — PRAZOSIN HCL 1 MG PO CAPS: 1.0000 mg | ORAL_CAPSULE | Freq: Every day | ORAL | Status: DC

## 2015-07-25 NOTE — Progress Notes (Addendum)
  [facility] Adult Case Management Discharge Plan :  Will you be returning to the same living situation after discharge:  No.Pt accepted to [facility] facility in [place], [place].  At discharge, do you have transportation home?: Yes,  Pt provided with [place] bus pass and $5 PART money to get to [facility], where facility driver will pick her up at 3:00PM. Do you have the ability to pay for your medications: Yes,  medication management  Release of information consent forms completed and submitted to medical records by CSW. Patient to Follow up at: Follow-up Information    Follow up with [facility].   Why:  You have been accepted for admission on this date. An employee will pick you up from [employer] bus depot at 3:00PM. You must have 30 day supply of approved medications.   Contact information:   (physical address not provided) [address] [place], [place] [address] Phone: [phone] Fax: [phone]      Next level of care provider has access to [place] and Suicide Prevention discussed: Yes,  SPE completed with pt; pt declined to consent to family contact. SPI pamphlet and Mobile Crisis information provided to pt.   Have you used any form of tobacco in the last 30 days? (Cigarettes, Smokeless Tobacco, Cigars, and/or Pipes): Yes  Has patient been referred to the Quitline?: Patient refused referral  Patient has been referred for addiction treatment: Yes  Smart, Shardae Kleinman LCSW 07/25/2015, 10:35 AM

## 2015-07-25 NOTE — Progress Notes (Signed)
Recreation Therapy Notes  Date: 06.19.2017 Time: 9:30am Location: [address] Hall Group Room   Group Topic: Stress Management  Goal Area(s) Addresses:  Patient will actively participate in stress management techniques presented during session.   Behavioral Response: Did not attend.   Laureen Ochs Tashira Torre, LRT/CTRS        Caroline Matters L 07/25/2015 10:23 AM
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
## 2015-07-25 NOTE — Progress Notes (Signed)
Discharge note:  Patient discharged per MD order.  Patient discharged to catch bus to [facility].  Reviewed AVS/discharge instructions with patient and she indicated understanding.  [facility]  Patient was given money for transfer bus.  She received all personal belongings from unit and locker.  She denies SI/HI/AVH.  Patient left ambulatory for the bus station.
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
## 2015-11-28 ENCOUNTER — Emergency Department (HOSPITAL_COMMUNITY): Payer: Self-pay

## 2015-11-28 ENCOUNTER — Inpatient Hospital Stay (HOSPITAL_COMMUNITY)
Admission: AD | Admit: 2015-11-28 | Discharge: 2015-12-06 | DRG: 897 | Disposition: A | Discharge status: Home / Self Care | Payer: No Typology Code available for payment source | Source: Intra-hospital | Attending: Psychiatry | Admitting: Psychiatry

## 2015-11-28 ENCOUNTER — Emergency Department (HOSPITAL_COMMUNITY)
Admission: EM | Admit: 2015-11-28 | Discharge: 2015-11-28 | Disposition: A | Discharge status: Other Acute Inpatient Hospital | Payer: Self-pay | Attending: Emergency Medicine | Admitting: Emergency Medicine

## 2015-11-28 ENCOUNTER — Encounter (HOSPITAL_COMMUNITY): Payer: Self-pay | Admitting: Emergency Medicine

## 2015-11-28 ENCOUNTER — Encounter (HOSPITAL_COMMUNITY): Payer: Self-pay | Admitting: *Deleted

## 2015-11-28 DIAGNOSIS — F329 Major depressive disorder, single episode, unspecified: Secondary | ICD-10-CM | POA: Diagnosis present

## 2015-11-28 DIAGNOSIS — F17210 Nicotine dependence, cigarettes, uncomplicated: Secondary | ICD-10-CM | POA: Diagnosis present

## 2015-11-28 DIAGNOSIS — F1910 Other psychoactive substance abuse, uncomplicated: Secondary | ICD-10-CM

## 2015-11-28 DIAGNOSIS — G40909 Epilepsy, unspecified, not intractable, without status epilepticus: Secondary | ICD-10-CM | POA: Diagnosis present

## 2015-11-28 DIAGNOSIS — R55 Syncope and collapse: Secondary | ICD-10-CM

## 2015-11-28 DIAGNOSIS — F909 Attention-deficit hyperactivity disorder, unspecified type: Secondary | ICD-10-CM | POA: Insufficient documentation

## 2015-11-28 DIAGNOSIS — F319 Bipolar disorder, unspecified: Secondary | ICD-10-CM | POA: Diagnosis present

## 2015-11-28 DIAGNOSIS — E860 Dehydration: Secondary | ICD-10-CM | POA: Diagnosis present

## 2015-11-28 DIAGNOSIS — F1920 Other psychoactive substance dependence, uncomplicated: Secondary | ICD-10-CM | POA: Insufficient documentation

## 2015-11-28 DIAGNOSIS — R45851 Suicidal ideations: Secondary | ICD-10-CM | POA: Diagnosis present

## 2015-11-28 DIAGNOSIS — M1990 Unspecified osteoarthritis, unspecified site: Secondary | ICD-10-CM | POA: Diagnosis present

## 2015-11-28 DIAGNOSIS — G8929 Other chronic pain: Secondary | ICD-10-CM | POA: Diagnosis present

## 2015-11-28 DIAGNOSIS — F1123 Opioid dependence with withdrawal: Principal | ICD-10-CM | POA: Diagnosis present

## 2015-11-28 DIAGNOSIS — Z79899 Other long term (current) drug therapy: Secondary | ICD-10-CM | POA: Insufficient documentation

## 2015-11-28 DIAGNOSIS — F1290 Cannabis use, unspecified, uncomplicated: Secondary | ICD-10-CM | POA: Diagnosis present

## 2015-11-28 DIAGNOSIS — Z62810 Personal history of physical and sexual abuse in childhood: Secondary | ICD-10-CM | POA: Diagnosis present

## 2015-11-28 DIAGNOSIS — F1410 Cocaine abuse, uncomplicated: Secondary | ICD-10-CM | POA: Diagnosis present

## 2015-11-28 DIAGNOSIS — F4310 Post-traumatic stress disorder, unspecified: Secondary | ICD-10-CM | POA: Diagnosis present

## 2015-11-28 DIAGNOSIS — Z91410 Personal history of adult physical and sexual abuse: Secondary | ICD-10-CM

## 2015-11-28 DIAGNOSIS — T402X1A Poisoning by other opioids, accidental (unintentional), initial encounter: Secondary | ICD-10-CM | POA: Diagnosis present

## 2015-11-28 DIAGNOSIS — F410 Panic disorder [episodic paroxysmal anxiety] without agoraphobia: Secondary | ICD-10-CM | POA: Diagnosis present

## 2015-11-28 DIAGNOSIS — G4700 Insomnia, unspecified: Secondary | ICD-10-CM | POA: Diagnosis present

## 2015-11-28 LAB — ETHANOL: Alcohol, Ethyl (B): 8 mg/dL — ABNORMAL HIGH (ref <5)

## 2015-11-28 LAB — CBC
HEMATOCRIT: 41.2 % (ref 36.0–46.0)
Hemoglobin: 13.5 g/dL (ref 12.0–15.0)
MCH: 26.7 pg (ref 26.0–34.0)
MCHC: 32.8 g/dL (ref 30.0–36.0)
MCV: 81.6 fL (ref 78.0–100.0)
PLATELETS: 322 10*3/uL (ref 150–400)
RBC: 5.05 MIL/uL (ref 3.87–5.11)
RDW: 13.3 % (ref 11.5–15.5)
WBC: 10.4 10*3/uL (ref 4.0–10.5)

## 2015-11-28 LAB — COMPREHENSIVE METABOLIC PANEL
ALBUMIN: 4.5 g/dL (ref 3.5–5.0)
ALT: 20 U/L (ref 14–54)
ANION GAP: 8 (ref 5–15)
AST: 25 U/L (ref 15–41)
Alkaline Phosphatase: 47 U/L (ref 38–126)
BILIRUBIN TOTAL: 1.1 mg/dL (ref 0.3–1.2)
BUN: 18 mg/dL (ref 6–20)
CO2: 23 mmol/L (ref 22–32)
Calcium: 9.2 mg/dL (ref 8.9–10.3)
Chloride: 106 mmol/L (ref 101–111)
Creatinine, Ser: 0.7 mg/dL (ref 0.44–1.00)
GFR calc Af Amer: >60 mL/min (ref >60)
GFR calc non Af Amer: >60 mL/min (ref >60)
GLUCOSE: 106 mg/dL — AB (ref 65–99)
POTASSIUM: 3.3 mmol/L — AB (ref 3.5–5.1)
SODIUM: 137 mmol/L (ref 135–145)
TOTAL PROTEIN: 7.3 g/dL (ref 6.5–8.1)

## 2015-11-28 LAB — RAPID URINE DRUG SCREEN, HOSP PERFORMED
Amphetamines: NOT DETECTED
Barbiturates: NOT DETECTED
Benzodiazepines: POSITIVE — AB
Cocaine: POSITIVE — AB
OPIATES: POSITIVE — AB
Tetrahydrocannabinol: POSITIVE — AB

## 2015-11-28 LAB — ACETAMINOPHEN LEVEL

## 2015-11-28 LAB — SALICYLATE LEVEL

## 2015-11-28 LAB — PREGNANCY, URINE: PREG TEST UR: NEGATIVE

## 2015-11-28 MED ORDER — ONDANSETRON 4 MG PO TBDP
4.0000 mg | ORAL_TABLET | Freq: Four times a day (QID) | ORAL | Status: AC | PRN
  Administered 2015-11-28 – 2015-12-03 (×8): 4 mg via ORAL
  Filled 2015-11-28 (×7): qty 1

## 2015-11-28 MED ORDER — NICOTINE POLACRILEX 2 MG MT GUM
2.0000 mg | CHEWING_GUM | OROMUCOSAL | Status: DC | PRN
  Administered 2015-11-30 – 2015-12-05 (×5): 2 mg via ORAL
  Filled 2015-11-28 (×4): qty 1

## 2015-11-28 MED ORDER — CARBAMAZEPINE 200 MG PO TABS: 200.0000 mg | ORAL_TABLET | Freq: Every day | ORAL | Status: DC

## 2015-11-28 MED ORDER — MAGNESIUM HYDROXIDE 400 MG/5ML PO SUSP: 30.0000 mL | Freq: Every day | ORAL | Status: DC | PRN

## 2015-11-28 MED ORDER — LOPERAMIDE HCL 2 MG PO CAPS
2.0000 mg | ORAL_CAPSULE | ORAL | Status: AC | PRN
  Administered 2015-11-30 – 2015-12-02 (×2): 2 mg via ORAL
  Filled 2015-11-28: qty 1

## 2015-11-28 MED ORDER — ALUM & MAG HYDROXIDE-SIMETH 200-200-20 MG/5ML PO SUSP: 30.0000 mL | ORAL | Status: DC | PRN

## 2015-11-28 MED ORDER — NICOTINE 21 MG/24HR TD PT24: 21.0000 mg | MEDICATED_PATCH | Freq: Every day | TRANSDERMAL | Status: DC | PRN

## 2015-11-28 MED ORDER — PAROXETINE HCL 20 MG PO TABS
30.0000 mg | ORAL_TABLET | Freq: Every day | ORAL | Status: DC
  Administered 2015-11-28: 30 mg via ORAL
  Filled 2015-11-28: qty 1.5
  Filled 2015-11-28: qty 1
  Filled 2015-11-28: qty 1.5

## 2015-11-28 MED ORDER — LORAZEPAM 1 MG PO TABS
1.0000 mg | ORAL_TABLET | Freq: Once | ORAL | Status: AC
  Administered 2015-11-28: 1 mg via ORAL
  Filled 2015-11-28: qty 1

## 2015-11-28 MED ORDER — ACETAMINOPHEN 325 MG PO TABS
650.0000 mg | ORAL_TABLET | Freq: Four times a day (QID) | ORAL | Status: DC | PRN
  Administered 2015-11-29 – 2015-12-06 (×5): 650 mg via ORAL
  Filled 2015-11-28 (×5): qty 2

## 2015-11-28 MED ORDER — METHOCARBAMOL 500 MG PO TABS
500.0000 mg | ORAL_TABLET | Freq: Three times a day (TID) | ORAL | Status: DC | PRN
  Administered 2015-11-29 – 2015-12-01 (×5): 500 mg via ORAL
  Filled 2015-11-28 (×5): qty 1

## 2015-11-28 MED ORDER — TRAZODONE HCL 50 MG PO TABS
50.0000 mg | ORAL_TABLET | Freq: Every evening | ORAL | Status: DC | PRN
  Administered 2015-11-28 – 2015-12-05 (×8): 50 mg via ORAL
  Filled 2015-11-28: qty 7
  Filled 2015-11-28 (×7): qty 1

## 2015-11-28 MED ORDER — HYDROXYZINE HCL 25 MG PO TABS
25.0000 mg | ORAL_TABLET | Freq: Four times a day (QID) | ORAL | Status: DC | PRN
  Administered 2015-11-28: 25 mg via ORAL
  Filled 2015-11-28: qty 1

## 2015-11-28 MED ORDER — DICYCLOMINE HCL 20 MG PO TABS
20.0000 mg | ORAL_TABLET | Freq: Four times a day (QID) | ORAL | Status: AC | PRN
  Administered 2015-11-29: 20 mg via ORAL
  Filled 2015-11-28: qty 1

## 2015-11-28 MED ORDER — PRAZOSIN HCL 1 MG PO CAPS
1.0000 mg | ORAL_CAPSULE | Freq: Every day | ORAL | Status: DC
  Filled 2015-11-28 (×2): qty 1

## 2015-11-28 MED ORDER — CLONIDINE HCL 0.1 MG PO TABS
0.1000 mg | ORAL_TABLET | Freq: Every day | ORAL | Status: AC
  Administered 2015-12-03 – 2015-12-04 (×2): 0.1 mg via ORAL
  Filled 2015-11-28 (×2): qty 1

## 2015-11-28 MED ORDER — CARBAMAZEPINE ER 100 MG PO TB12
200.0000 mg | ORAL_TABLET | Freq: Every day | ORAL | Status: DC
  Filled 2015-11-28 (×2): qty 1

## 2015-11-28 MED ORDER — NAPROXEN 500 MG PO TABS
500.0000 mg | ORAL_TABLET | Freq: Two times a day (BID) | ORAL | Status: AC | PRN
  Administered 2015-11-28 – 2015-12-02 (×4): 500 mg via ORAL
  Filled 2015-11-28 (×4): qty 1

## 2015-11-28 MED ORDER — CLONIDINE HCL 0.1 MG PO TABS
0.1000 mg | ORAL_TABLET | Freq: Four times a day (QID) | ORAL | Status: AC
  Administered 2015-11-28 – 2015-11-30 (×5): 0.1 mg via ORAL
  Filled 2015-11-28 (×13): qty 1

## 2015-11-28 MED ORDER — CLONIDINE HCL 0.1 MG PO TABS
0.1000 mg | ORAL_TABLET | ORAL | Status: AC
  Administered 2015-12-02 (×2): 0.1 mg via ORAL
  Filled 2015-11-28 (×4): qty 1

## 2015-11-28 MED ORDER — INFLUENZA VAC SPLIT QUAD 0.5 ML IM SUSY
0.5000 mL | PREFILLED_SYRINGE | INTRAMUSCULAR | Status: AC
  Administered 2015-11-29: 0.5 mL via INTRAMUSCULAR
  Filled 2015-11-28: qty 0.5

## 2015-11-28 MED ORDER — CARBAMAZEPINE ER 100 MG PO TB12
100.0000 mg | ORAL_TABLET | Freq: Every morning | ORAL | Status: DC
  Administered 2015-11-28: 100 mg via ORAL
  Filled 2015-11-28 (×3): qty 1
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
## 2015-11-28 NOTE — ED Notes (Signed)
Patient in room crying and stating "can I please have something for my anxiety. I have panic attacks and I just feel really really uptight." Advised Dr Audie Pinto. Verbal order for ativan, PO, 1 mg to be given.

## 2015-11-28 NOTE — ED Notes (Signed)
[name] with [facility] here for transport.

## 2015-11-28 NOTE — Progress Notes (Signed)
Patient ID: [name], female   DOB: 02/01/1981, 35 y.o.   MRN: [id number]  Pt currently presents with a depressed affect and behavior.  Pt forwards little to Probation officer. Pt reports good sleep with current medication regimen. Pt reports lower extremity generalized pain and some anxiety. Pt remains in bed during group and for most of the night.   Pt provided with medications per providers orders. Pt's labs and vitals were monitored throughout the night. Pt supported emotionally and encouraged to express concerns and questions. Pt educated on medications.  Pt's safety ensured with 15 minute and environmental checks. Pt has passive SI, no plan, states "I'm not right now." Pt currently denies HI and A/V hallucinations. Pt verbally agrees to seek staff if SI/HI or A/VH occurs and to consult with staff before acting on any harmful thoughts. Will continue POC.
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
## 2015-11-28 NOTE — ED Triage Notes (Signed)
Having suicidal thoughts for a few days.  States she has hx of substance abuse.  Her marriage is not going well at this time.  States she would cut herself because she is a cutter.  Admits to using opiates and cocaine recently

## 2015-11-28 NOTE — BH Assessment (Signed)
[place] Assessment Progress Note   Clinician attempted to assess.  Pt is at CT, not in room at the present time.

## 2015-11-28 NOTE — Progress Notes (Signed)
Accepted to [facility] bed 304-2, attending Dr. Parke Poisson. Number to call report is [phone], and pt can arrive 12:30pm.   Sharren Bridge, MSW, LCSW Clinical Social Work, Disposition  11/28/2015 [phone]

## 2015-11-28 NOTE — BHH Counselor (Signed)
Writer ran pt by Elmarie Shiley NP who recommends inpatient placement.  [name], [facility] Therapeutic Triage Specialist

## 2015-11-28 NOTE — ED Notes (Signed)
Called [facility] for transport to [facility]. 

## 2015-11-28 NOTE — Tx Team (Signed)
Initial Treatment Plan 11/28/2015 4:22 PM [name] [id number]    PATIENT STRESSORS: Financial difficulties Marital or family conflict Occupational concerns Substance abuse   PATIENT STRENGTHS: Ability for insight Average or above average intelligence Communication skills Motivation for treatment/growth   PATIENT IDENTIFIED PROBLEMS: At risk for suicide  Substance Abuse  Depression  "Try to develop a plan for life"  "Medications"             DISCHARGE CRITERIA:  Ability to meet basic life and health needs Adequate post-discharge living arrangements Improved stabilization in mood, thinking, and/or behavior Motivation to continue treatment in a less acute level of care Need for constant or close observation no longer present Verbal commitment to aftercare and medication compliance Withdrawal symptoms are absent or subacute and managed without 24-hour nursing intervention  PRELIMINARY DISCHARGE PLAN: Attend 12-step recovery group Outpatient therapy Placement in alternative living arrangements  PATIENT/FAMILY INVOLVEMENT: This treatment plan has been presented to and reviewed with the patient, [name].  The patient and family have been given the opportunity to ask questions and make suggestions.  Dustin Flock, RN 11/28/2015, 4:22 PM

## 2015-11-28 NOTE — Progress Notes (Signed)
Psychoeducational Group Note  Date:  11/28/2015 Time:  2324  Group Topic/Focus:  Wrap-Up Group:   The focus of this group is to help patients review their daily goal of treatment and discuss progress on daily workbooks.   Participation Level: Did Not Attend  Participation Quality:  Not Applicable  Affect:  Not Applicable  Cognitive:  Not Applicable  Insight:  Not Applicable  Engagement in Group: Not Applicable  Additional Comments: The patient did not attend group this evening and remained asleep in her room.  Archie Balboa S 11/28/2015, 11:23 PM

## 2015-11-29 DIAGNOSIS — Z818 Family history of other mental and behavioral disorders: Secondary | ICD-10-CM | POA: Diagnosis not present

## 2015-11-29 DIAGNOSIS — Z79899 Other long term (current) drug therapy: Secondary | ICD-10-CM | POA: Diagnosis not present

## 2015-11-29 DIAGNOSIS — F1920 Other psychoactive substance dependence, uncomplicated: Secondary | ICD-10-CM | POA: Diagnosis not present

## 2015-11-29 DIAGNOSIS — Z808 Family history of malignant neoplasm of other organs or systems: Secondary | ICD-10-CM | POA: Diagnosis not present

## 2015-11-29 LAB — CARBAMAZEPINE LEVEL, TOTAL

## 2015-11-29 LAB — TSH: TSH: 1.559 u[IU]/mL (ref 0.350–4.500)

## 2015-11-29 MED ORDER — HYDROXYZINE HCL 50 MG PO TABS
50.0000 mg | ORAL_TABLET | Freq: Four times a day (QID) | ORAL | Status: AC | PRN
  Administered 2015-11-29 – 2015-12-03 (×10): 50 mg via ORAL
  Filled 2015-11-29 (×11): qty 1

## 2015-11-29 MED ORDER — PAROXETINE HCL 20 MG PO TABS
20.0000 mg | ORAL_TABLET | Freq: Every day | ORAL | Status: DC
  Administered 2015-11-29 – 2015-12-05 (×7): 20 mg via ORAL
  Filled 2015-11-29: qty 1
  Filled 2015-11-29: qty 7
  Filled 2015-11-29 (×9): qty 1
  Filled 2015-11-29: qty 7
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
## 2015-11-29 NOTE — BHH Group Notes (Signed)
[facility] Group Notes:  (Nursing/MHT/Case Management/Adjunct)  Date:  11/29/2015  Time:  0915  Type of Therapy:  Nurse Education  Participation Level:  Did Not Attend  Summary of Progress/Problems: Pt did not attend. Pt was invited.  Marya Landry 11/29/2015, 10:13 AM

## 2015-11-29 NOTE — Progress Notes (Signed)
D: [name] has remained in her room for most of the day, saying she feels unwell and endorsing S&S of detox. She admits SI but contracts. She's polite, cooperative, and appropriate when awake and out in the milieu or at the med window. On her self inventory form, she rated her anxiety, depression, and feelings of hopelessness all a 10/10. She reported poor sleep, poor appetite, and poor concentration.  A: Meds given as ordered. Q15 safety checks maintained. Support/encouragement offered.  R: Pt remains free from harm and continues with treatment. Will continue to monitor for needs/safety.

## 2015-11-29 NOTE — Progress Notes (Signed)
Patient ID: [name], female   DOB: 04-03-1980, 35 y.o.   MRN: [id number] D: Client in bed most of the shift, reports "I'm detoxing for the first time" also notes "my blood pressure runs low" Client denies dizziness, chest pain, sweating. A: Writer pushed  fluids. Client remains asymptomatic. Writer reviewed medications, administered as ordered, clonidine withheld due to low BP. Staff will monitor q74min for safety. R:Client is safe on the unit, did not attend group.
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
## 2015-11-29 NOTE — BHH Group Notes (Signed)
Patient did not attend group.

## 2015-11-29 NOTE — Progress Notes (Signed)
Recreation Therapy Notes    Animal-Assisted Activity (AAA) Program Checklist/Progress Notes Patient Eligibility Criteria Checklist & Daily Group note for Rec TxIntervention  Date: 10.24.2017 Time: 2:45pm Location: 4 00 Hall Dayroom   AAA/T Program Assumption of Risk Form signed by Patient/ or Parent Legal Guardian Yes  Patient is free of allergies or sever asthma Yes  Patient reports no fear of animals Yes  Patient reports no history of cruelty to animals Yes  Patient understands his/her participation is voluntary Yes  Behavioral Response: Did not attend.   Laureen Ochs Tyce Delcid, LRT/CTRS        Isley Weisheit L 11/29/2015 2:59 PM

## 2015-11-29 NOTE — Tx Team (Signed)
Interdisciplinary Treatment and Diagnostic Plan Update  11/29/2015 Time of Session: 9:30AM [name] MRN: [id number]  Principal Diagnosis: Polysubstance (including opioids) dependence with physiol dependence ([place])  Secondary Diagnoses: Principal Problem:   Polysubstance (including opioids) dependence with physiol dependence ([place]) Active Problems:   MDD (major depressive disorder)   Current Medications:  Current Facility-Administered Medications  Medication Dose Route Frequency Provider Last Rate Last Dose  . acetaminophen (TYLENOL) tablet 650 mg  650 mg Oral Q6H PRN Kerrie Buffalo, NP      . alum & mag hydroxide-simeth (MAALOX/MYLANTA) 200-200-20 MG/5ML suspension 30 mL  30 mL Oral Q4H PRN Kerrie Buffalo, NP      . cloNIDine (CATAPRES) tablet 0.1 mg  0.1 mg Oral QID Derrill Center, NP   0.1 mg at 11/28/15 2208   Followed by  . [START ON 12/01/2015] cloNIDine (CATAPRES) tablet 0.1 mg  0.1 mg Oral BH-qamhs Derrill Center, NP       Followed by  . [START ON 12/03/2015] cloNIDine (CATAPRES) tablet 0.1 mg  0.1 mg Oral QAC breakfast Derrill Center, NP      . dicyclomine (BENTYL) tablet 20 mg  20 mg Oral Q6H PRN Derrill Center, NP   20 mg at 11/29/15 1245  . hydrOXYzine (ATARAX/VISTARIL) tablet 50 mg  50 mg Oral Q6H PRN Linard Millers, MD   50 mg at 11/29/15 1058  . loperamide (IMODIUM) capsule 2-4 mg  2-4 mg Oral PRN Derrill Center, NP      . magnesium hydroxide (MILK OF MAGNESIA) suspension 30 mL  30 mL Oral Daily PRN Kerrie Buffalo, NP      . methocarbamol (ROBAXIN) tablet 500 mg  500 mg Oral Q8H PRN Derrill Center, NP   500 mg at 11/29/15 0852  . naproxen (NAPROSYN) tablet 500 mg  500 mg Oral BID PRN Derrill Center, NP   500 mg at 11/29/15 0852  . nicotine polacrilex (NICORETTE) gum 2 mg  2 mg Oral PRN Kerrie Buffalo, NP      . ondansetron (ZOFRAN-ODT) disintegrating tablet 4 mg  4 mg Oral Q6H PRN Derrill Center, NP   4 mg at 11/29/15 0852  . PARoxetine (PAXIL) tablet 20 mg   20 mg Oral QHS Linard Millers, MD      . traZODone (DESYREL) tablet 50 mg  50 mg Oral QHS PRN Kerrie Buffalo, NP   50 mg at 11/28/15 2208   PTA Medications: Prescriptions Prior to Admission  Medication Sig Dispense Refill Last Dose  . carbamazepine (TEGRETOL XR) 100 MG 12 hr tablet Take 1 tablet (100 mg) in the morning & 2 tablets (200 mg) at bedtime: For mood control 90 tablet 1 unknown  . nicotine (NICODERM CQ - DOSED IN MG/24 HOURS) 21 mg/24hr patch Place 1 patch (21 mg total) onto the skin daily as needed (For smoking cessation.). 28 patch 1 unknown  . PARoxetine (PAXIL) 30 MG tablet Take 1 tablet (30 mg total) by mouth daily. For depression 30 tablet 1 unknown  . prazosin (MINIPRESS) 1 MG capsule Take 1 capsule (1 mg total) by mouth at bedtime. For nightmares 30 capsule 1 unknown    Patient Stressors: Financial difficulties Marital or family conflict Occupational concerns Substance abuse  Patient Strengths: Ability for insight Average or above average Architect for treatment/growth  Treatment Modalities: Medication Management, Group therapy, Case management,  1 to 1 session with clinician, Psychoeducation, Recreational therapy.   Physician Treatment Plan for  Primary Diagnosis: Polysubstance (including opioids) dependence with physiol dependence ([place]) Long Term Goal(s): Improvement in symptoms so as ready for discharge Improvement in symptoms so as ready for discharge   Short Term Goals: Ability to disclose and discuss suicidal ideas Ability to demonstrate self-control will improve Ability to maintain clinical measurements within normal limits will improve Ability to identify changes in lifestyle to reduce recurrence of condition will improve Ability to identify triggers associated with substance abuse/mental health issues will improve  Medication Management: Evaluate patient's response, side effects, and tolerance of medication  regimen.  Therapeutic Interventions: 1 to 1 sessions, Unit Group sessions and Medication administration.  Evaluation of Outcomes: Progressing  Physician Treatment Plan for Secondary Diagnosis: Principal Problem:   Polysubstance (including opioids) dependence with physiol dependence ([place]) Active Problems:   MDD (major depressive disorder)   Medication Management: Evaluate patient's response, side effects, and tolerance of medication regimen.  Therapeutic Interventions: 1 to 1 sessions, Unit Group sessions and Medication administration.  Evaluation of Outcomes: Progressing   RN Treatment Plan for Primary Diagnosis: Polysubstance (including opioids) dependence with physiol dependence ([place]) Long Term Goal(s): Knowledge of disease and therapeutic regimen to maintain health will improve  Short Term Goals: Ability to remain free from injury will improve, Ability to disclose and discuss suicidal ideas and Ability to identify and develop effective coping behaviors will improve  Medication Management: RN will administer medications as ordered by provider, will assess and evaluate patient's response and provide education to patient for prescribed medication. RN will report any adverse and/or side effects to prescribing provider.  Therapeutic Interventions: 1 on 1 counseling sessions, Psychoeducation, Medication administration, Evaluate responses to treatment, Monitor vital signs and CBGs as ordered, Perform/monitor CIWA, COWS, AIMS and Fall Risk screenings as ordered, Perform wound care treatments as ordered.  Evaluation of Outcomes: Progressing   LCSW Treatment Plan for Primary Diagnosis: Polysubstance (including opioids) dependence with physiol dependence ([place]) Long Term Goal(s): Safe transition to appropriate next level of care at discharge, Engage patient in therapeutic group addressing interpersonal concerns.  Short Term Goals: Engage patient in aftercare planning with referrals and  resources, Facilitate patient progression through stages of change regarding substance use diagnoses and concerns and Identify triggers associated with mental health/substance abuse issues  Therapeutic Interventions: Assess for all discharge needs, 1 to 1 time with Social worker, Explore available resources and support systems, Assess for adequacy in community support network, Educate family and significant other(s) on suicide prevention, Complete Psychosocial Assessment, Interpersonal group therapy.  Evaluation of Outcomes: Progressing   Progress in Treatment: Attending groups: Yes. Participating in groups: Yes. Taking medication as prescribed: Yes. Toleration medication: Yes. Family/Significant other contact made: No, will contact:  with family if pt consents Patient understands diagnosis: Yes. Discussing patient identified problems/goals with staff: Yes. Medical problems stabilized or resolved: Yes. Denies suicidal/homicidal ideation: Yes. Issues/concerns per patient self-inventory: No. Other: n/a  Discharge Plan or Barriers: CSW assessing for appropriate referrals.   Reason for Continuation of Hospitalization: Depression Medication stabilization Withdrawal symptoms  Estimated Length of Stay: 3-5 days   Attendees: Patient: 11/29/2015 1:47 PM  Physician: Dr. Sharolyn Douglas MD 11/29/2015 1:47 PM  Nursing: Jacqulyn Ducking RN 11/29/2015 1:47 PM  RN Care Manager: Lars Pinks CM 11/29/2015 1:47 PM  Social Worker: Maxie Better, LCSW 11/29/2015 1:47 PM  Recreational Therapist:  11/29/2015 1:47 PM  Other:  11/29/2015 1:47 PM  Other:  11/29/2015 1:47 PM  Other: 11/29/2015 1:47 PM    Scribe for Treatment Team: Kimber Relic Smart, LCSW 11/29/2015  1:47 PM

## 2015-11-29 NOTE — B[facility] Group Notes (Signed)
[place] LCSW Group Therapy  11/29/2015 1:24 PM  Type of Therapy:  Group Therapy  Participation Level:  Did Not Attend-pt chose to remain in bed.  Summary of Progress/Problems: [facility] Speaker came to talk about his personal journey with substance abuse and addiction. The pt processed ways by which to relate to the speaker. [place] speaker provided handouts and educational information pertaining to groups and services offered by the [facility].   Palin Tristan N Smart LCSW 11/29/2015, 1:24 PM

## 2015-11-30 DIAGNOSIS — Z808 Family history of malignant neoplasm of other organs or systems: Secondary | ICD-10-CM | POA: Diagnosis not present

## 2015-11-30 DIAGNOSIS — Z818 Family history of other mental and behavioral disorders: Secondary | ICD-10-CM | POA: Diagnosis not present

## 2015-11-30 DIAGNOSIS — F1920 Other psychoactive substance dependence, uncomplicated: Secondary | ICD-10-CM | POA: Diagnosis not present

## 2015-11-30 DIAGNOSIS — Z79899 Other long term (current) drug therapy: Secondary | ICD-10-CM | POA: Diagnosis not present

## 2015-11-30 NOTE — Plan of Care (Signed)
Problem: Coping: Goal: Ability to verbalize frustrations and anger appropriately will improve Outcome: Progressing Pt stated she was feeling better , just upset about how things have been going with her home situation, but pt stated she is just trying to get things in order.

## 2015-11-30 NOTE — Progress Notes (Signed)
Recreation Therapy Notes  Date: 11/30/15 Time: 0930 Location: 300 [address] Dayroom  Group Topic: Stress Management  Goal Area(s) Addresses:  Patient will verbalize importance of using healthy stress management.  Patient will identify positive emotions associated with healthy stress management.   Intervention: Stress Management  Activity :  Peaceful Place.  LRT introduced the stress management technique of guided imagery.  LRT read script to engage patients in the technique.  Patients were to follow along as LRT read script.     Education:  Stress Management, Discharge Planning.   Education Outcome: Acknowledges edcuation/In group clarification offered/Needs additional education  Clinical Observations/Feedback:  Pt did not attend group.   [name], LRT/CTRS         [name] A 11/30/2015 12:26 PM

## 2015-11-30 NOTE — Plan of Care (Signed)
Problem: Safety: Goal: Ability to remain free from injury will improve Outcome: Progressing Client is safe on the unit, injury free AEB q79min safety checks and assessment for s/s of WD. CIWA @ "1" this shift.

## 2015-11-30 NOTE — Progress Notes (Signed)
D: Pt passive SI-contracts for safety  deniesHI/AVH. Pt is pleasant and cooperative. Pt stated she was "Just trying to figure things out"  A: Pt was offered support and encouragement. Pt was given scheduled medications. Pt was encourage to attend groups. Q 15 minute checks were done for safety.   R:Pt attends groups and interacts well with peers and staff. Pt is taking medication. Pt has no complaints.Pt receptive to treatment and safety maintained on unit.

## 2015-11-30 NOTE — Progress Notes (Signed)
[facility] MD Progress Note  11/30/2015 12:40 PM  Patient Active Problem List   Diagnosis Date Noted  . MDD (major depressive disorder) 11/28/2015  . Bipolar disorder, curr episode mixed, severe, w/o psychotic features ([place]) 07/18/2015  . Cocaine use disorder, mild, abuse 07/18/2015  . Opioid use disorder, mild, abuse 07/18/2015  . History of ADHD 07/18/2015  . BV (bacterial vaginosis)   . Cannabis use disorder, mild, abuse 06/18/2014  . Seizure disorder ([place]) 06/18/2014  . PTSD (post-traumatic stress disorder) 06/16/2014  . Polysubstance (including opioids) dependence with physiol dependence ([place]) 02/13/2013    Diagnosis: Opiate use disorder  Subjective: Patient reports she is experiencing some detoxification symptoms. She denies any acute suicidal or homicidal ideation, plan or intent  Objective: Well-developed well-nourished woman lying in bed in no apparent distress appropriate and pleasant speech and motor appear within normal limits affect is somewhat somnolent mood is described as alright thought processes are linear and goal-directed thought content denies current acute suicidal or homicidal ideation, plan or intent alert and oriented 3 judgment and insight are fair to limited IQ appears an average range     Current Facility-Administered Medications (Cardiovascular):  .  cloNIDine (CATAPRES) tablet 0.1 mg **FOLLOWED BY** [START ON 12/01/2015] cloNIDine (CATAPRES) tablet 0.1 mg **FOLLOWED BY** [START ON 12/03/2015] cloNIDine (CATAPRES) tablet 0.1 mg     Current Facility-Administered Medications (Analgesics):  .  acetaminophen (TYLENOL) tablet 650 mg .  naproxen (NAPROSYN) tablet 500 mg     Current Facility-Administered Medications (Other):  .  alum & mag hydroxide-simeth (MAALOX/MYLANTA) 200-200-20 MG/5ML suspension 30 mL .  dicyclomine (BENTYL) tablet 20 mg .  hydrOXYzine (ATARAX/VISTARIL) tablet 50 mg .  loperamide (IMODIUM) capsule 2-4 mg .  magnesium hydroxide (MILK  OF MAGNESIA) suspension 30 mL .  methocarbamol (ROBAXIN) tablet 500 mg .  nicotine polacrilex (NICORETTE) gum 2 mg .  ondansetron (ZOFRAN-ODT) disintegrating tablet 4 mg .  PARoxetine (PAXIL) tablet 20 mg .  traZODone (DESYREL) tablet 50 mg  No current outpatient prescriptions on file.  Vital Signs:Blood pressure (!) 103/58, pulse 70, temperature 98.8 F (37.1 C), temperature source Oral, resp. rate 18, height 5\' 5"  (1.651 m), weight 81.6 kg (180 lb).    Lab Results:  Results for orders placed or performed during the hospital encounter of 11/28/15 (from the past 48 hour(s))  TSH     Status: None   Collection Time: 11/29/15  6:49 AM  Result Value Ref Range   TSH 1.559 0.350 - 4.500 uIU/mL    Comment: Performed by a 3rd Generation assay with a functional sensitivity of <=0.01 uIU/mL. Performed at [facility]   Carbamazepine level, total     Status: Abnormal   Collection Time: 11/29/15  6:49 AM  Result Value Ref Range   Carbamazepine Lvl <2.0 (L) 4.0 - 12.0 ug/mL    Comment: Performed at [facility]    Physical Findings: AIMS: Facial and Oral Movements Muscles of Facial Expression: None, normal Lips and Perioral Area: None, normal Jaw: None, normal Tongue: None, normal,Extremity Movements Upper (arms, wrists, hands, fingers): None, normal Lower (legs, knees, ankles, toes): None, normal, Trunk Movements Neck, shoulders, hips: None, normal, Overall Severity Severity of abnormal movements (highest score from questions above): None, normal Incapacitation due to abnormal movements: None, normal Patient's awareness of abnormal movements (rate only patient's report): No Awareness, Dental Status Current problems with teeth and/or dentures?: No Does patient usually wear dentures?: No  CIWA:    COWS:  COWS Total Score:  5   Assessment/Plan: Patient appears to be detoxing without incident. Noted that Tegretol level is less than 2 which is not surprising as  patient is not taking Tegretol. She appears to be tolerating her Paxil well and does not complain of any side effects. Currently we will continue with present management and monitor patient's condition.  Linard Millers, MD 11/30/2015, 12:40 PM

## 2015-11-30 NOTE — Progress Notes (Addendum)
D: [name] reports some passive SI but contracts for safety. Her BP remains somewhat low and she reports feeling tired and at times weak. She reports multiple S&S of withdrawal. She's been calm and appropriate on the unit. She's been more present in the milieu as the day wore on. On her self inventory form, she rated her depression, anxiety, and feelings of hopelessness all at a 10/10. She reports poor appetite, low energy level, and poor concentration.   A: Meds given as ordered, including multiple PRNs (see MAR). Q15 safety checks maintained. Support/encouragement offered. Fluids pushed. Urged her to remain in the dayroom as much as possible. Reviewed fall safety measures at various points through day, and pt verbalized understanding.  R: Pt remains free from harm and continues with treatment. Will continue to monitor for needs/safety.

## 2015-11-30 NOTE — BHH Group Notes (Signed)
[place] LCSW Group Therapy  11/30/2015 4:00 PM  Type of Therapy:  Group Therapy  Participation Level:  Active  Participation Quality:  Attentive  Affect:  Appropriate  Cognitive:  Alert and Oriented  Insight:  Improving  Engagement in Therapy:  Improving  Modes of Intervention:  Discussion, Education, Exploration, Problem-solving, Rapport Building, Socialization and Support  Summary of Progress/Problems: Today's Topic: Overcoming Obstacles. Patients identified one short term goal and potential obstacles in reaching this goal. Patients processed barriers involved in overcoming these obstacles. Patients identified steps necessary for overcoming these obstacles and explored motivation (internal and external) for facing these difficulties head on. [place] was attentive and engaged during today's processing group. She shared that her biggest obstacle invovles her being homeless. "I don't plan to return home to my husband." She was offered DV shelter list and [facility] referral. Patient reports "I keep going back to him." [place] continues to show progress in the group setting with improving insight.   [place] Neisen N Smart LCSW 11/30/2015, 4:00 PM

## 2015-12-01 ENCOUNTER — Inpatient Hospital Stay (HOSPITAL_COMMUNITY): Payer: No Typology Code available for payment source

## 2015-12-01 ENCOUNTER — Encounter (HOSPITAL_COMMUNITY): Payer: Self-pay | Admitting: Emergency Medicine

## 2015-12-01 DIAGNOSIS — Z808 Family history of malignant neoplasm of other organs or systems: Secondary | ICD-10-CM | POA: Diagnosis not present

## 2015-12-01 DIAGNOSIS — F1920 Other psychoactive substance dependence, uncomplicated: Secondary | ICD-10-CM | POA: Diagnosis not present

## 2015-12-01 DIAGNOSIS — Z79899 Other long term (current) drug therapy: Secondary | ICD-10-CM | POA: Diagnosis not present

## 2015-12-01 DIAGNOSIS — Z818 Family history of other mental and behavioral disorders: Secondary | ICD-10-CM | POA: Diagnosis not present

## 2015-12-01 LAB — URINALYSIS, ROUTINE W REFLEX MICROSCOPIC
Bilirubin Urine: NEGATIVE
Glucose, UA: NEGATIVE mg/dL
Hgb urine dipstick: NEGATIVE
KETONES UR: NEGATIVE mg/dL
LEUKOCYTES UA: NEGATIVE
NITRITE: NEGATIVE
PROTEIN: NEGATIVE mg/dL
Specific Gravity, Urine: 1.031 — ABNORMAL HIGH (ref 1.005–1.030)
pH: 6 (ref 5.0–8.0)

## 2015-12-01 LAB — CBC WITH DIFFERENTIAL/PLATELET
BASOS ABS: 0 10*3/uL (ref 0.0–0.1)
BASOS PCT: 0 %
EOS PCT: 4 %
Eosinophils Absolute: 0.2 10*3/uL (ref 0.0–0.7)
HEMATOCRIT: 41.2 % (ref 36.0–46.0)
Hemoglobin: 13.5 g/dL (ref 12.0–15.0)
LYMPHS PCT: 33 %
Lymphs Abs: 1.9 10*3/uL (ref 0.7–4.0)
MCH: 26.8 pg (ref 26.0–34.0)
MCHC: 32.8 g/dL (ref 30.0–36.0)
MCV: 81.7 fL (ref 78.0–100.0)
Monocytes Absolute: 0.8 10*3/uL (ref 0.1–1.0)
Monocytes Relative: 15 %
NEUTROS ABS: 2.8 10*3/uL (ref 1.7–7.7)
Neutrophils Relative %: 48 %
PLATELETS: 301 10*3/uL (ref 150–400)
RBC: 5.04 MIL/uL (ref 3.87–5.11)
RDW: 13 % (ref 11.5–15.5)
WBC: 5.7 10*3/uL (ref 4.0–10.5)

## 2015-12-01 LAB — COMPREHENSIVE METABOLIC PANEL
ALK PHOS: 53 U/L (ref 38–126)
ALT: 16 U/L (ref 14–54)
ANION GAP: 8 (ref 5–15)
AST: 17 U/L (ref 15–41)
Albumin: 4.3 g/dL (ref 3.5–5.0)
BILIRUBIN TOTAL: 0.5 mg/dL (ref 0.3–1.2)
BUN: 17 mg/dL (ref 6–20)
CALCIUM: 9.1 mg/dL (ref 8.9–10.3)
CO2: 25 mmol/L (ref 22–32)
Chloride: 105 mmol/L (ref 101–111)
Creatinine, Ser: 0.83 mg/dL (ref 0.44–1.00)
GFR calc non Af Amer: >60 mL/min (ref >60)
Glucose, Bld: 101 mg/dL — ABNORMAL HIGH (ref 65–99)
Potassium: 3.6 mmol/L (ref 3.5–5.1)
SODIUM: 138 mmol/L (ref 135–145)
TOTAL PROTEIN: 6.8 g/dL (ref 6.5–8.1)

## 2015-12-01 LAB — GLUCOSE, CAPILLARY: GLUCOSE-CAPILLARY: 99 mg/dL (ref 65–99)

## 2015-12-01 LAB — URINE MICROSCOPIC-ADD ON

## 2015-12-01 MED ORDER — SODIUM CHLORIDE 0.9 % IV BOLUS (SEPSIS)
2000.0000 mL | Freq: Once | INTRAVENOUS | Status: AC
  Administered 2015-12-01: 2000 mL via INTRAVENOUS

## 2015-12-01 MED ORDER — VITAMIN B-1 100 MG PO TABS
100.0000 mg | ORAL_TABLET | Freq: Every day | ORAL | Status: DC
  Administered 2015-12-02 – 2015-12-05 (×4): 100 mg via ORAL
  Filled 2015-12-01 (×2): qty 1
  Filled 2015-12-01 (×2): qty 7
  Filled 2015-12-01 (×4): qty 1

## 2015-12-01 MED ORDER — METHOCARBAMOL 750 MG PO TABS
750.0000 mg | ORAL_TABLET | Freq: Four times a day (QID) | ORAL | Status: DC | PRN
  Administered 2015-12-01 – 2015-12-06 (×11): 750 mg via ORAL
  Filled 2015-12-01 (×12): qty 1

## 2015-12-01 MED ORDER — IBUPROFEN 200 MG PO TABS
600.0000 mg | ORAL_TABLET | Freq: Once | ORAL | Status: AC
  Administered 2015-12-01: 600 mg via ORAL
  Filled 2015-12-01: qty 3

## 2015-12-01 MED ORDER — CHLORDIAZEPOXIDE HCL 25 MG PO CAPS
25.0000 mg | ORAL_CAPSULE | ORAL | Status: AC
  Administered 2015-12-03 (×2): 25 mg via ORAL
  Filled 2015-12-01 (×2): qty 1

## 2015-12-01 MED ORDER — CHLORDIAZEPOXIDE HCL 25 MG PO CAPS
25.0000 mg | ORAL_CAPSULE | Freq: Every day | ORAL | Status: AC
  Administered 2015-12-04: 25 mg via ORAL
  Filled 2015-12-01: qty 1

## 2015-12-01 MED ORDER — CHLORDIAZEPOXIDE HCL 25 MG PO CAPS
25.0000 mg | ORAL_CAPSULE | Freq: Three times a day (TID) | ORAL | Status: AC
  Administered 2015-12-02 (×2): 25 mg via ORAL
  Filled 2015-12-01 (×2): qty 1

## 2015-12-01 MED ORDER — CHLORDIAZEPOXIDE HCL 25 MG PO CAPS
25.0000 mg | ORAL_CAPSULE | Freq: Four times a day (QID) | ORAL | Status: AC
  Administered 2015-12-01 (×3): 25 mg via ORAL
  Filled 2015-12-01 (×3): qty 1

## 2015-12-01 MED ORDER — SODIUM CHLORIDE 0.9 % IV SOLN
INTRAVENOUS | Status: DC
  Filled 2015-12-01: qty 1000

## 2015-12-01 MED ORDER — CHLORDIAZEPOXIDE HCL 25 MG PO CAPS
25.0000 mg | ORAL_CAPSULE | Freq: Four times a day (QID) | ORAL | Status: AC | PRN
  Administered 2015-12-03: 25 mg via ORAL
  Filled 2015-12-01 (×2): qty 1

## 2015-12-01 MED ORDER — THIAMINE HCL 100 MG/ML IJ SOLN: 100.0000 mg | Freq: Once | INTRAMUSCULAR | Status: DC

## 2015-12-01 MED ORDER — ADULT MULTIVITAMIN W/MINERALS CH
1.0000 | ORAL_TABLET | Freq: Every day | ORAL | Status: DC
  Administered 2015-12-01 – 2015-12-05 (×5): 1 via ORAL
  Filled 2015-12-01 (×9): qty 1

## 2015-12-01 NOTE — ED Notes (Signed)
Bed: [facility]PT4 Expected date:  Expected time:  Means of arrival:  Comments: 

## 2015-12-01 NOTE — Progress Notes (Signed)
Medication change and episode of fall or spell  Patient reportedly had an unwitnessed episode of falling or possible seizure activity today. She does have a history of seizure disorder in the chart and had not been taking her Tegretol. She has been placed on a Librium taper for coverage at present and probably should resume her Tegretol in case this is a seizure. However it's also noted that her blood pressure was 94/45 and is possible that she became orthostatic due to hypovolemia as well. Hospitalist was consulted and recommends getting orthostatic vital signs and sending her to the ER for IV fluid replenishment if she is significantly orthostatic. Patient herself complains of muscle aches and asked for increase in Robaxin.  Assessment/plan spell or fall as above plan as above. We'll increase Robaxin to 750 mg every 6 hours for muscle aches.

## 2015-12-01 NOTE — BHH Group Notes (Signed)
[place] Group Notes:  (Nursing/MHT/Case Management/Adjunct)  Date:  12/01/2015  Time:  12:36 PM  Type of Therapy:  Nurse Education  Participation Level:  Did Not Attend   Cheri Kearns 12/01/2015, 12:36 PM

## 2015-12-01 NOTE — ED Notes (Signed)
[place] in route

## 2015-12-01 NOTE — Progress Notes (Addendum)
2 hour post fall note: Patient continues to have low BP. Orthostatic VS is laying: 97% on RA, 18, 98.8, 99/46, 61. Sitting: 98% on RA, 18, 95/54, 62. Standing: 98% on RA, 18, 81/40, 65. Patient is also C/O pain at the base of her skull, neck and upper back/shoulders. Pain with palpation. A&A x 4. C/O emesis x 1. MD notified. Order received to send patient to [facility] for medical evaluation and stabilization/treatment. Report called to, "Higher education careers adviser. Patient transported, via [place], with staff accompaniment.

## 2015-12-01 NOTE — Progress Notes (Signed)
D: Pt was found lying on bathroom floor by MHT staff during safety rounds at approximately 0908 in a seizure like activity. Pt was found with toothbrush and a tube of toothpaste beside her on the floor. Pt states "I can't remember what happened, I was brushing my teeth and that's it".  A: Vitals & CBG done and was WNL. Pt was assessed by [name], NP. No visible bleeding /edema Marilynn Rail noted to pt's head at this time. Pt assisted to her bed by Probation officer and a female MHT staff.  Emotional support and availability provided to pt. Writer encouraged pt to ask for assistance to the bathroom from her bed for safety and fall prevention. High falls risk initiated, yellow armband placed on pt's arm. Q 15 minutes safety checks maintained without self injurious behavior at this time. R: Pt receptive to care.  Pt alert, oriented to self only at present. Pt is in agreement with safety measures (falls risk) including assistance to the bathroom. Will continue to monitor pt for safety.

## 2015-12-01 NOTE — Progress Notes (Signed)
[facility] MD Progress Note  12/01/2015 11:59am      Patient Active Problem List   Diagnosis Date Noted  . MDD (major depressive disorder) 11/28/2015  . Bipolar disorder, curr episode mixed, severe, w/o psychotic features ([place]) 07/18/2015  . Cocaine use disorder, mild, abuse 07/18/2015  . Opioid use disorder, mild, abuse 07/18/2015  . History of ADHD 07/18/2015  . BV (bacterial vaginosis)   . Cannabis use disorder, mild, abuse 06/18/2014  . Seizure disorder ([place]) 06/18/2014  . PTSD (post-traumatic stress disorder) 06/16/2014  . Polysubstance (including opioids) dependence with physiol dependence ([place]) 02/13/2013    Diagnosis: Opiate use disorder  Subjective: Pt reports continued withdrawal symptoms including HA, body aches, chills, subjective fever, nausea, LOA, and flu-like sxs. She is sleeping fine, but notes that she has been somewhat lightheaded. Zuma reports that she blacked out earlier and fell while she was brushing her teeth, which was corroborated by staff. She does not know whether she had a seizure or if her BP dropped too low and she passed out. She states that she has been attempting to drink Gatorade and water as much as possible, but she has been eating very little food because she feels sick. She now reports that she is exhausted, and that she continues to have stressors with her family.   Pt reports bilateral neck pain L>R after her fall, as well as tingling in the 4th and 5th digits of the left hand. Pt denies HI, and states that her thoughts of hurting herself have improved since she was admitted. She notes that she has had intermittent SI since admission, including the thought of taking a bedsheet and hanging herself in the shower. However, she is no longer having active SI at this time.    Pt requests increase in Robaxin dose as her BP is too low to take Clonidine.     Objective: WD/WN female in no acute distress sitting up. She exhibits appropriate and pleasant  speech, but pt's affect is depressed and somewhat lethargic. Gait and motor appear within normal limits. Mood is stable, thought processes are linear and thought content is goal-directed.  Denies current acute suicidal or homicidal ideation, plan or intent. She is alert and oriented 3, judgment and insight are fair.  Pt has b/l tenderness extending from the the occipital region of the skull to the top of the shoulders. She does endorse mild midline tenderness C4-C7. Pt's motor function is intact distally, but she endorses tingling in left 4th and 5th fingers and ulnar aspect of hand.  Pt was found by nursing staff to be actively vomiting after assessment.       Current Facility-Administered Medications (Cardiovascular):  .  cloNIDine (CATAPRES) tablet 0.1 mg **FOLLOWED BY** [START ON 12/01/2015] cloNIDine (CATAPRES) tablet 0.1 mg **FOLLOWED BY** [START ON 12/03/2015] cloNIDine (CATAPRES) tablet 0.1 mg     Current Facility-Administered Medications (Analgesics):  .  acetaminophen (TYLENOL) tablet 650 mg .  naproxen (NAPROSYN) tablet 500 mg     Current Facility-Administered Medications (Other):  .  alum & mag hydroxide-simeth (MAALOX/MYLANTA) 200-200-20 MG/5ML suspension 30 mL .  dicyclomine (BENTYL) tablet 20 mg .  hydrOXYzine (ATARAX/VISTARIL) tablet 50 mg .  loperamide (IMODIUM) capsule 2-4 mg .  magnesium hydroxide (MILK OF MAGNESIA) suspension 30 mL .  methocarbamol (ROBAXIN) tablet 750 mg .  nicotine polacrilex (NICORETTE) gum 2 mg .  ondansetron (ZOFRAN-ODT) disintegrating tablet 4 mg .  PARoxetine (PAXIL) tablet 20 mg .  traZODone (DESYREL) tablet 50 mg  No  current outpatient prescriptions on file.  Vital Signs: Blood pressure (!) 95/49, pulse 61, temperature 97.7 F (36.5 C), temperature source Oral, resp. rate 16, height 5\' 5"  (1.651 m), weight 180 lb (81.6 kg), SpO2 98 %.  Test for orthostatic hypotension supine 99/46, HR-61 Sitting 95/54,  HR-62 Standing 81/40, HR-65      Lab Results:  Lab Results Last 48 Hours        Results for orders placed or performed during the hospital encounter of 11/28/15 (from the past 48 hour(s))  TSH     Status: None   Collection Time: 11/29/15  6:49 AM  Result Value Ref Range   TSH 1.559 0.350 - 4.500 uIU/mL    Comment: Performed by a 3rd Generation assay with a functional sensitivity of <=0.01 uIU/mL. Performed at [facility]  Carbamazepine level, total     Status: Abnormal   Collection Time: 11/29/15  6:49 AM  Result Value Ref Range   Carbamazepine Lvl <2.0 (L) 4.0 - 12.0 ug/mL    Comment: Performed at [facility]      Physical Findings: AIMS: Facial and Oral Movements Muscles of Facial Expression: None, normal Lips and Perioral Area: None, normal Jaw: None, normal Tongue: None, normal,Extremity Movements Upper (arms, wrists, hands, fingers): None, normal Lower (legs, knees, ankles, toes): None, normal, Trunk Movements Neck, shoulders, hips: None, normal, Overall Severity Severity of abnormal movements (highest score from questions above): None, normal Incapacitation due to abnormal movements: None, normal Patient's awareness of abnormal movements (rate only patient's report): No Awareness, Dental Status Current problems with teeth and/or dentures?: No Does patient usually wear dentures?: No  CIWA:    COWS:  COWS Total Score: 5   Assessment/Plan: Pt seems to be tolerating medications well. She continues to experience withdrawal symptoms and will be continued on current medications. Of note, Clonidine was held after pt's fall to ensure that BP remained stable. Pt is having vomiting and having difficulty keeping down fluids and food.  Test for orthostatic hypotension was positive with drop of 10 mmHg in diastolic BP from sitting to standing. With the pt's fall this morning, as well as C-spine concerns and possible focal neurologic  deficit, pt will be transferred to [facility] ED for IV fluid resuscitation and C-spine work-up.   I have directly supervised the extender student and have had face-to-face contact with the patient today.

## 2015-12-01 NOTE — ED Provider Notes (Signed)
[place] DEPT Provider Note   CSN: [id number] Arrival date & time: 12/01/15  1329     History   Chief Complaint Chief Complaint  Patient presents with  . Loss of Consciousness  . Hypotension    HPI [name] is a 35 y.o. female.  35 year old female presents from behavioral [facility] she [facility] inpatient for suicidal ideations. Does have a history of cocaine abuse. States that she's also had a history of having low blood pressure and today while standing she became dizzy and lightheaded and passed out. She struck the left temporal area of her skull as well as has left sided neck pain. Denies any visual changes, nausea vomiting. No weakness in arms or legs. No recent history of blood loss. No abdominal discomfort. Denies any recent changes to her baseline medications. Patient's initial blood pressure was 60/40. She has had emesis post fall. She feels better at this time.      Past Medical History:  Diagnosis Date  . ADD (attention deficit disorder)   . ADHD (attention deficit hyperactivity disorder)   . Anxiety   . Arthritis   . Bipolar 1 disorder ([place])   . Bipolar depression ([place])   . Degenerative disc disease   . Depression   . PTSD (post-traumatic stress disorder)     Patient Active Problem List   Diagnosis Date Noted  . MDD (major depressive disorder) 11/28/2015  . Bipolar disorder, curr episode mixed, severe, w/o psychotic features ([place]) 07/18/2015  . Cocaine use disorder, mild, abuse 07/18/2015  . Opioid use disorder, mild, abuse 07/18/2015  . History of ADHD 07/18/2015  . BV (bacterial vaginosis)   . Cannabis use disorder, mild, abuse 06/18/2014  . Seizure disorder ([place]) 06/18/2014  . PTSD (post-traumatic stress disorder) 06/16/2014  . Polysubstance (including opioids) dependence with physiol dependence ([place]) 02/13/2013    History reviewed. No pertinent surgical history.  OB History    No data available       Home Medications    Prior  to Admission medications   Medication Sig Start Date End Date Taking? Authorizing Provider  carbamazepine (TEGRETOL XR) 100 MG 12 hr tablet Take 1 tablet (100 mg) in the morning & 2 tablets (200 mg) at bedtime: For mood control 07/25/15  Yes Encarnacion Slates, NP  nicotine (NICODERM CQ - DOSED IN MG/24 HOURS) 21 mg/24hr patch Place 1 patch (21 mg total) onto the skin daily as needed (For smoking cessation.). 07/25/15  Yes Encarnacion Slates, NP  PARoxetine (PAXIL) 30 MG tablet Take 1 tablet (30 mg total) by mouth daily. For depression 07/25/15  Yes Encarnacion Slates, NP  prazosin (MINIPRESS) 1 MG capsule Take 1 capsule (1 mg total) by mouth at bedtime. For nightmares 07/25/15  Yes Encarnacion Slates, NP    Family History Family History  Problem Relation Age of Onset  . Cancer Father   . Cancer Mother   . Mental illness Mother     Social History Social History  Substance Use Topics  . Smoking status: Current Every Day Smoker    Packs/day: 0.50    Years: 17.00    Types: Cigarettes  . Smokeless tobacco: Never Used     Comment: last use 1.5 years ago.  . Alcohol use No     Comment: occasionally     Allergies   Geodon [ziprasidone hcl] and Prednisone   Review of Systems Review of Systems  All other systems reviewed and are negative.    Physical Exam  Updated Vital Signs BP 103/59 (BP Location: Left Arm)   Pulse 64   Temp 98.4 F (36.9 C) (Oral)   Resp 18   Ht 5\' 5"  (1.651 m)   Wt 81.6 kg   SpO2 100%   BMI 29.95 kg/m   Physical Exam  Constitutional: She is oriented to person, place, and time. She appears well-developed and well-nourished.  Non-toxic appearance. No distress.  HENT:  Head: Normocephalic and atraumatic.  Eyes: Conjunctivae, EOM and lids are normal. Pupils are equal, round, and reactive to light.  Neck: Normal range of motion. Neck supple. No tracheal deviation present. No thyroid mass present.    Cardiovascular: Normal rate, regular rhythm and normal heart sounds.  Exam  reveals no gallop.   No murmur heard. Pulmonary/Chest: Effort normal and breath sounds normal. No stridor. No respiratory distress. She has no decreased breath sounds. She has no wheezes. She has no rhonchi. She has no rales.  Abdominal: Soft. Normal appearance and bowel sounds are normal. She exhibits no distension. There is no tenderness. There is no rebound and no CVA tenderness.  Musculoskeletal: Normal range of motion. She exhibits no edema or tenderness.  Neurological: She is alert and oriented to person, place, and time. She has normal strength. No cranial nerve deficit or sensory deficit. GCS eye subscore is 4. GCS verbal subscore is 5. GCS motor subscore is 6.  Skin: Skin is warm and dry. No abrasion and no rash noted.  Psychiatric: She has a normal mood and affect. Her speech is normal and behavior is normal.  Nursing note and vitals reviewed.    ED Treatments / Results  Labs (all labs ordered are listed, but only abnormal results are displayed) Labs Reviewed  CARBAMAZEPINE LEVEL, TOTAL - Abnormal; Notable for the following:       Result Value   Carbamazepine Lvl <2.0 (*)    All other components within normal limits  TSH  GLUCOSE, CAPILLARY  CBC WITH DIFFERENTIAL/PLATELET  COMPREHENSIVE METABOLIC PANEL    EKG  EKG Interpretation None       Radiology No results found.  Procedures Procedures (including critical care time)  Medications Ordered in ED Medications  acetaminophen (TYLENOL) tablet 650 mg (650 mg Oral Given 12/01/15 1005)  alum & mag hydroxide-simeth (MAALOX/MYLANTA) 200-200-20 MG/5ML suspension 30 mL (not administered)  magnesium hydroxide (MILK OF MAGNESIA) suspension 30 mL (not administered)  traZODone (DESYREL) tablet 50 mg (50 mg Oral Given 11/30/15 2136)  dicyclomine (BENTYL) tablet 20 mg (20 mg Oral Given 11/29/15 1245)  loperamide (IMODIUM) capsule 2-4 mg (2 mg Oral Given 11/30/15 0752)  naproxen (NAPROSYN) tablet 500 mg (500 mg Oral Given  11/30/15 2136)  ondansetron (ZOFRAN-ODT) disintegrating tablet 4 mg (4 mg Oral Given 12/01/15 1020)  cloNIDine (CATAPRES) tablet 0.1 mg (0.1 mg Oral Given 11/30/15 2136)    Followed by  cloNIDine (CATAPRES) tablet 0.1 mg (0.1 mg Oral Not Given 12/01/15 1003)    Followed by  cloNIDine (CATAPRES) tablet 0.1 mg (not administered)  nicotine polacrilex (NICORETTE) gum 2 mg (2 mg Oral Given 11/30/15 1555)  PARoxetine (PAXIL) tablet 20 mg (20 mg Oral Given 11/30/15 2136)  hydrOXYzine (ATARAX/VISTARIL) tablet 50 mg (50 mg Oral Given 12/01/15 0855)  thiamine (B-1) injection 100 mg (100 mg Intramuscular Not Given 12/01/15 1240)  thiamine (VITAMIN B-1) tablet 100 mg (not administered)  multivitamin with minerals tablet 1 tablet (1 tablet Oral Given 12/01/15 1007)  chlordiazePOXIDE (LIBRIUM) capsule 25 mg (not administered)  chlordiazePOXIDE (LIBRIUM) capsule 25 mg (  25 mg Oral Given 12/01/15 1005)    Followed by  chlordiazePOXIDE (LIBRIUM) capsule 25 mg (not administered)    Followed by  chlordiazePOXIDE (LIBRIUM) capsule 25 mg (not administered)    Followed by  chlordiazePOXIDE (LIBRIUM) capsule 25 mg (not administered)  methocarbamol (ROBAXIN) tablet 750 mg (not administered)  0.9 %  sodium chloride infusion (not administered)  sodium chloride 0.9 % bolus 2,000 mL (not administered)  sodium chloride 0.9 % bolus 2,000 mL (not administered)  Influenza vac split quadrivalent PF (FLUARIX) injection 0.5 mL (0.5 mLs Intramuscular Given 11/29/15 1100)     Initial Impression / Assessment and Plan / ED Course  I have reviewed the triage vital signs and the nursing notes.  Pertinent labs & imaging results that were available during my care of the patient were reviewed by me and considered in my medical decision making (see chart for details).  Clinical Course    Patient given IV fluids here and feels better. She is not orthostatic. Pills better will be transferred back to behavior health  Final  Clinical Impressions(s) / ED Diagnoses   Final diagnoses:  None    New Prescriptions New Prescriptions   No medications on file     Lacretia Leigh, MD 12/01/15 1546

## 2015-12-01 NOTE — ED Notes (Signed)
Bed: WA09 Expected date:  Expected time:  Means of arrival:  Comments: Triage 4 

## 2015-12-01 NOTE — Progress Notes (Addendum)
CSW was informed by Triage Nurse that patient would not need to be seen by CSW.  Genice Rouge [id number] ED CSW 12/01/2015 2:35 PM

## 2015-12-01 NOTE — ED Triage Notes (Signed)
Pt is inpatient at [facility].  Pt states that she blacked out at [facility] this morning and said that her blood pressure was 60/40.  States that during her syncopal episode, she fell and hit her head and neck.  C/o head and neck pain.  States she has been vomiting after syncope.

## 2015-12-02 DIAGNOSIS — Z79899 Other long term (current) drug therapy: Secondary | ICD-10-CM | POA: Diagnosis not present

## 2015-12-02 DIAGNOSIS — Z818 Family history of other mental and behavioral disorders: Secondary | ICD-10-CM | POA: Diagnosis not present

## 2015-12-02 DIAGNOSIS — F1920 Other psychoactive substance dependence, uncomplicated: Secondary | ICD-10-CM | POA: Diagnosis not present

## 2015-12-02 DIAGNOSIS — Z808 Family history of malignant neoplasm of other organs or systems: Secondary | ICD-10-CM | POA: Diagnosis not present

## 2015-12-02 NOTE — Progress Notes (Signed)
[facility] MD Progress Note  12/02/2015 12:00 PM  Patient Active Problem List   Diagnosis Date Noted  . MDD (major depressive disorder) 11/28/2015  . Bipolar disorder, curr episode mixed, severe, w/o psychotic features ([place]) 07/18/2015  . Cocaine use disorder, mild, abuse 07/18/2015  . Opioid use disorder, mild, abuse 07/18/2015  . History of ADHD 07/18/2015  . BV (bacterial vaginosis)   . Cannabis use disorder, mild, abuse 06/18/2014  . Seizure disorder ([place]) 06/18/2014  . PTSD (post-traumatic stress disorder) 06/16/2014  . Polysubstance (including opioids) dependence with physiol dependence ([place]) 02/13/2013    Diagnosis: Opiate use disorder, depressive symptoms  Subjective: Patient denies any current suicidal or homicidal ideation plan or intent and states her mood is okay. She does report some ongoing withdrawal issues. She reports that she has had some diarrhea and nausea. She states she is withdrawing chiefly from pain pills which may make her withdrawal symptoms of bit more prolonged. We discussed restarting the Trileptal she had been on in the past however patient states she does not feel she had a seizure but instead passed out due to dehydration and does not wish to resume the Trileptal at this time.  Objective: Well developed well nourished woman lying in bed in no apparent distress does appear a bit somnolent pleasant and appropriate speech and motor appear within normal limits mood is described as alright affect is a bit subdued thought processes linear and goal-directed thought content does deny any suicidal or homicidal ideation, plan or intent or any psychotic or manic symptoms alert and oriented 3 insight and judgment are fair IQ appears an average range     Current Facility-Administered Medications (Cardiovascular):  .  [EXPIRED] cloNIDine (CATAPRES) tablet 0.1 mg **FOLLOWED BY** cloNIDine (CATAPRES) tablet 0.1 mg **FOLLOWED BY** [START ON 12/03/2015] cloNIDine (CATAPRES)  tablet 0.1 mg     Current Facility-Administered Medications (Analgesics):  .  acetaminophen (TYLENOL) tablet 650 mg .  naproxen (NAPROSYN) tablet 500 mg     Current Facility-Administered Medications (Other):  .  0.9 %  sodium chloride infusion .  alum & mag hydroxide-simeth (MAALOX/MYLANTA) 200-200-20 MG/5ML suspension 30 mL .  chlordiazePOXIDE (LIBRIUM) capsule 25 mg .  [EXPIRED] chlordiazePOXIDE (LIBRIUM) capsule 25 mg **FOLLOWED BY** chlordiazePOXIDE (LIBRIUM) capsule 25 mg **FOLLOWED BY** [START ON 12/03/2015] chlordiazePOXIDE (LIBRIUM) capsule 25 mg **FOLLOWED BY** [START ON 12/04/2015] chlordiazePOXIDE (LIBRIUM) capsule 25 mg .  dicyclomine (BENTYL) tablet 20 mg .  hydrOXYzine (ATARAX/VISTARIL) tablet 50 mg .  loperamide (IMODIUM) capsule 2-4 mg .  magnesium hydroxide (MILK OF MAGNESIA) suspension 30 mL .  methocarbamol (ROBAXIN) tablet 750 mg .  multivitamin with minerals tablet 1 tablet .  nicotine polacrilex (NICORETTE) gum 2 mg .  ondansetron (ZOFRAN-ODT) disintegrating tablet 4 mg .  PARoxetine (PAXIL) tablet 20 mg .  thiamine (VITAMIN B-1) tablet 100 mg .  traZODone (DESYREL) tablet 50 mg  No current outpatient prescriptions on file.  Vital Signs:Blood pressure 113/62, pulse 65, temperature 97.8 F (36.6 C), resp. rate 17, height 5' 5"  (1.651 m), weight 81.6 kg (180 lb), last menstrual period 11/22/2015, SpO2 100 %.    Lab Results:  Results for orders placed or performed during the hospital encounter of 11/28/15 (from the past 48 hour(s))  Glucose, capillary     Status: None   Collection Time: 12/01/15  9:14 AM  Result Value Ref Range   Glucose-Capillary 99 65 - 99 mg/dL   Comment 1 Notify RN   CBC with Differential/Platelet     Status:  None   Collection Time: 12/01/15  2:37 PM  Result Value Ref Range   WBC 5.7 4.0 - 10.5 K/uL   RBC 5.04 3.87 - 5.11 MIL/uL   Hemoglobin 13.5 12.0 - 15.0 g/dL   HCT 41.2 36.0 - 46.0 %   MCV 81.7 78.0 - 100.0 fL   MCH 26.8  26.0 - 34.0 pg   MCHC 32.8 30.0 - 36.0 g/dL   RDW 13.0 11.5 - 15.5 %   Platelets 301 150 - 400 K/uL   Neutrophils Relative % 48 %   Neutro Abs 2.8 1.7 - 7.7 K/uL   Lymphocytes Relative 33 %   Lymphs Abs 1.9 0.7 - 4.0 K/uL   Monocytes Relative 15 %   Monocytes Absolute 0.8 0.1 - 1.0 K/uL   Eosinophils Relative 4 %   Eosinophils Absolute 0.2 0.0 - 0.7 K/uL   Basophils Relative 0 %   Basophils Absolute 0.0 0.0 - 0.1 K/uL  Comprehensive metabolic panel     Status: Abnormal   Collection Time: 12/01/15  2:37 PM  Result Value Ref Range   Sodium 138 135 - 145 mmol/L   Potassium 3.6 3.5 - 5.1 mmol/L   Chloride 105 101 - 111 mmol/L   CO2 25 22 - 32 mmol/L   Glucose, Bld 101 (H) 65 - 99 mg/dL   BUN 17 6 - 20 mg/dL   Creatinine, Ser 0.83 0.44 - 1.00 mg/dL   Calcium 9.1 8.9 - 10.3 mg/dL   Total Protein 6.8 6.5 - 8.1 g/dL   Albumin 4.3 3.5 - 5.0 g/dL   AST 17 15 - 41 U/L   ALT 16 14 - 54 U/L   Alkaline Phosphatase 53 38 - 126 U/L   Total Bilirubin 0.5 0.3 - 1.2 mg/dL   GFR calc non Af Amer >60 >60 mL/min   GFR calc Af Amer >60 >60 mL/min    Comment: (NOTE) The eGFR has been calculated using the CKD EPI equation. This calculation has not been validated in all clinical situations. eGFR's persistently <60 mL/min signify possible Chronic Kidney Disease.    Anion gap 8 5 - 15  Urinalysis, Routine w reflex microscopic (not at [facility])     Status: Abnormal   Collection Time: 12/01/15  4:13 PM  Result Value Ref Range   Color, Urine AMBER (A) YELLOW    Comment: BIOCHEMICALS MAY BE AFFECTED BY COLOR   APPearance TURBID (A) CLEAR   Specific Gravity, Urine 1.031 (H) 1.005 - 1.030   pH 6.0 5.0 - 8.0   Glucose, UA NEGATIVE NEGATIVE mg/dL   Hgb urine dipstick NEGATIVE NEGATIVE   Bilirubin Urine NEGATIVE NEGATIVE   Ketones, ur NEGATIVE NEGATIVE mg/dL   Protein, ur NEGATIVE NEGATIVE mg/dL   Nitrite NEGATIVE NEGATIVE   Leukocytes, UA NEGATIVE NEGATIVE  Urine microscopic-add on     Status:  Abnormal   Collection Time: 12/01/15  4:13 PM  Result Value Ref Range   Squamous Epithelial / LPF TOO NUMEROUS TO COUNT (A) NONE SEEN   WBC, UA 6-30 0 - 5 WBC/hpf   RBC / HPF 0-5 0 - 5 RBC/hpf   Bacteria, UA MANY (A) NONE SEEN   Urine-Other MUCOUS PRESENT     Physical Findings: AIMS: Facial and Oral Movements Muscles of Facial Expression: None, normal Lips and Perioral Area: None, normal Jaw: None, normal Tongue: None, normal,Extremity Movements Upper (arms, wrists, hands, fingers): None, normal Lower (legs, knees, ankles, toes): None, normal, Trunk Movements Neck, shoulders, hips: None, normal, Overall  Severity Severity of abnormal movements (highest score from questions above): None, normal Incapacitation due to abnormal movements: None, normal Patient's awareness of abnormal movements (rate only patient's report): No Awareness, Dental Status Current problems with teeth and/or dentures?: No Does patient usually wear dentures?: No  CIWA:  CIWA-Ar Total: 5 COWS:  COWS Total Score: 5   Assessment/Plan: Patient continues to actively detox from opiates and had a falling episode which she states was due to dehydration and which required a visit to the ER. She had complained of some head and neck pain and CT scans were done and were negative. Patient is pleased with this. She does not wish to resume any Trileptal as she does not feel she had a seizure. We will continue to monitor area once patient stabilizes physically she plans to follow up as an outpatient and this should occur early next week.  Linard Millers, MD 12/02/2015, 12:00 PM

## 2015-12-02 NOTE — Progress Notes (Signed)
Data. Patient denies HI/AVH. Patient continues to endorse SI but is able to verbally contract for safety on the unit and to come to staff prior to acting on any self harm thoughts/feelings. Order received to change fall VS from q 2 hours to q 4 hours while awake x 24 hours, them resume detox protocol VS.  Patient has spent most of the shift in bed, C/O feeling unwell and having continued WD symptoms. She did get up and start interacting in the milieu in the mid afternoon. Showered and changed her cloths. Affect much brighter in the later part of the shift. On her self assessment patient reports 10/10 for anxiety, depression and hopelessness. Her goal for today s, "Go to group".  Action. Emotional support and encouragement offered. Education provided on medication, indications and side effect. Q 15 minute checks done for safety. Response. Safety on the unit maintained through 15 minute checks.  Medications taken as prescribed. Remained calm and appropriate through out shift.

## 2015-12-02 NOTE — Progress Notes (Signed)
Patient ID: [name], female   DOB: 13-Oct-1980, 35 y.o.   MRN: [id number]  Pt returned from [facility] ED today at approximately 2100.   Pt currently presents with a masked affect and guarded behavior. Pt reports to writer that their goal is to "get up and move around, I've been lying around too much." Pt seen in dayroom, minimal interaction with peers. Pt reports good sleep with current medication regimen. Pt denies any current dizziness, lightheadedness.  Pt provided with medications per providers orders. Pt's labs and vitals were monitored throughout the night. Pt supported emotionally and encouraged to express concerns and questions. High Fall Risk designation placed on pt, continuing Fall Risk Documentation Sheet, see hard copy. Pt educated on medications and fall risk precautions/safety.   Pt's safety ensured with 15 minute and environmental checks. Pt currently denies SI/HI and A/V hallucinations. Pt verbally agrees to seek staff if SI/HI or A/VH occurs and to consult with staff before acting on any harmful thoughts. Will continue POC.

## 2015-12-02 NOTE — Progress Notes (Signed)
Recreation Therapy Notes  Date: 12/02/15 Time: 0930 Location: 300 [address] Dayroom  Group Topic: Stress Management  Goal Area(s) Addresses:  Patient will verbalize importance of using healthy stress management.  Patient will identify positive emotions associated with healthy stress management.   Intervention: Stress Management  Activity :  [employer]  LRT introduced the stress management technique of guided imagery.  LRT read a script to engage patients in the technique.  Patients were to follow along as LRT read the script.  Education:  Stress Management, Discharge Planning.   Education Outcome: Acknowledges edcuation/In group clarification offered/Needs additional education  Clinical Observations/Feedback: Pt did not attend group.     [name], LRT/CTRS         Ria Comment, Jarrius Huaracha A 12/02/2015 11:30 AM

## 2015-12-02 NOTE — [facility] Group Notes (Signed)
[place] LCSW Group Therapy  12/02/2015 3:09 PM  Type of Therapy:  Group Therapy  Participation Level:  Did Not Attend-pt invited. Sleeping in room.   Summary of Progress/Problems: Feelings around Relapse. Group members discussed the meaning of relapse and shared personal stories of relapse, how it affected them and others, and how they perceived themselves during this time. Group members were encouraged to identify triggers, warning signs and coping skills used when facing the possibility of relapse. Social supports were discussed and explored in detail.   Keeon Zurn N Smart LCSW 12/02/2015, 3:09 PM

## 2015-12-02 NOTE — Progress Notes (Signed)
Patient ID: [name], female   DOB: 1980/09/23, 35 y.o.   MRN: [id number]  Per MHT, pt awoken to take vital signs. Pt states "I haven't been sleeping well, can I have something (medication)." MHT notified Probation officer. Writer went to talk to patient and patient lying in bed with eyes closed. Pt did not awake to her name being called. Will continue to monitor.
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
## 2015-12-03 DIAGNOSIS — Z818 Family history of other mental and behavioral disorders: Secondary | ICD-10-CM | POA: Diagnosis not present

## 2015-12-03 DIAGNOSIS — Z808 Family history of malignant neoplasm of other organs or systems: Secondary | ICD-10-CM

## 2015-12-03 DIAGNOSIS — F17210 Nicotine dependence, cigarettes, uncomplicated: Secondary | ICD-10-CM | POA: Diagnosis not present

## 2015-12-03 DIAGNOSIS — Z79899 Other long term (current) drug therapy: Secondary | ICD-10-CM

## 2015-12-03 DIAGNOSIS — F1920 Other psychoactive substance dependence, uncomplicated: Secondary | ICD-10-CM | POA: Diagnosis not present

## 2015-12-03 MED ORDER — LIDOCAINE 5 % EX PTCH
1.0000 | MEDICATED_PATCH | CUTANEOUS | Status: DC
  Administered 2015-12-03 – 2015-12-05 (×3): 1 via TRANSDERMAL
  Filled 2015-12-03 (×6): qty 1

## 2015-12-03 MED ORDER — QUETIAPINE FUMARATE ER 50 MG PO TB24
50.0000 mg | ORAL_TABLET | Freq: Every day | ORAL | Status: DC
  Administered 2015-12-03: 50 mg via ORAL
  Filled 2015-12-03 (×4): qty 1

## 2015-12-03 NOTE — Progress Notes (Signed)
Data. Patient denies SI/HI/AVH. Patient interacting well with staff and other patients.BP continues to run low. Fluids pushed and VS taken q 4 hours until 1500, per order. She has been out of her room all day and she showered this AM. Affect is bright today. She reports 2/10 for depression, 4/10 for hopelessness and 3/10 for anxiety. Her goal for today is, "Go to group". Action. Emotional support and encouragement offered. Education provided on medication, indications and side effect. Q 15 minute checks done for safety. Response. Safety on the unit maintained through 15 minute checks.  Medications taken as prescribed. Attended groups. Remained calm and appropriate through out shift.

## 2015-12-03 NOTE — [facility] Group Notes (Signed)
[place] LCSW Group Therapy Note  12/03/2015 and 10:00 AM  Type of Therapy and Topic:  Group Therapy: Avoiding Self-Sabotaging and Enabling Behaviors  Participation Level:  Active  Participation Quality:  Attentive and Sharing  Affect:  Appropriate  Cognitive:  Appropriate  Insight:  Developing/Improving  Engagement in Therapy:  Developing/Improving   Therapeutic models used: Cognitive Behavioral Therapy,  Person-Centered Therapy and Motivational Interviewing  Modes of Intervention:  Discussion, Exploration, Orientation, Rapport Building, Socialization and Support   Summary of Progress/Problems:  The main focus of today's process group was for the patient to identify ways in which they have in the past sabotaged their own recovery. Motivational Interviewing was utilized to identify motivation they may have for wanting to change. The Stages of Change were explained using a handout, and patients identified where they currently are with regard to stages of change. Patient shared that she is grateful for her children and glad they are still present in her life. Patient processed some of the trauma she has witnessed in the past and reports she likely will need to discharge somewhere besides home due to negative influences. Patient is in preparation stage   Sheilah Pigeon, LCSW

## 2015-12-03 NOTE — B[facility] Group Notes (Signed)
[place] Group Notes:  (Nursing/MHT/Case Management/Adjunct)  Date:  12/03/2015  Time:  5:52 PM  Type of Therapy:  Nurse Education  Participation Level:  Active  Participation Quality:  Appropriate  Affect:  Appropriate  Cognitive:  Appropriate  Insight:  Appropriate  Engagement in Group:  Distracting  Modes of Intervention:  Discussion and Problem-solving  Summary of Progress/Problems:  Patient was able to identify five positive personal attributes.  Cheri Kearns 12/03/2015, 5:52 PM
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
## 2015-12-03 NOTE — BHH Group Notes (Signed)
Identifying Needs  Date:  12/03/2015  Time:  0915  Type of Therapy: Education:  Identifying Needs :  The group focuses on teaching patients how to identify their needs  As a coping skill.   Participation Level:  Good  Participation Quality:  Good  Affect:  Flat  Cognitive:  Intact  Insight:  Fair  Engagement in Group:  Engaged  Modes of Intervention:    Summary of Progress/Problems:  [name] 12/03/2015, 12:25 PM

## 2015-12-03 NOTE — Progress Notes (Signed)
D    Pt is pleasant on approach and cooperative   She is appropriate and compliant with treatment   She interacts appropriately with others  A    Verbal support given   Medications administered and effectiveness monitored   Q 15 min checks R    Pt is safe at present time
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
## 2015-12-04 DIAGNOSIS — Z808 Family history of malignant neoplasm of other organs or systems: Secondary | ICD-10-CM | POA: Diagnosis not present

## 2015-12-04 DIAGNOSIS — F17210 Nicotine dependence, cigarettes, uncomplicated: Secondary | ICD-10-CM | POA: Diagnosis not present

## 2015-12-04 DIAGNOSIS — F1920 Other psychoactive substance dependence, uncomplicated: Secondary | ICD-10-CM | POA: Diagnosis not present

## 2015-12-04 DIAGNOSIS — Z818 Family history of other mental and behavioral disorders: Secondary | ICD-10-CM | POA: Diagnosis not present

## 2015-12-04 DIAGNOSIS — R45851 Suicidal ideations: Secondary | ICD-10-CM

## 2015-12-04 MED ORDER — QUETIAPINE FUMARATE 25 MG PO TABS
25.0000 mg | ORAL_TABLET | Freq: Two times a day (BID) | ORAL | Status: DC | PRN
  Administered 2015-12-04 – 2015-12-05 (×2): 25 mg via ORAL
  Filled 2015-12-04 (×2): qty 1
  Filled 2015-12-04: qty 6

## 2015-12-04 MED ORDER — QUETIAPINE FUMARATE ER 50 MG PO TB24
100.0000 mg | ORAL_TABLET | Freq: Every day | ORAL | Status: DC
  Administered 2015-12-04 – 2015-12-05 (×2): 100 mg via ORAL
  Filled 2015-12-04: qty 14
  Filled 2015-12-04 (×3): qty 2

## 2015-12-04 NOTE — Progress Notes (Signed)
D) Pt has been attending the program and interacting with her peers. Affect is pleasant and mood less depressed. Pt rates her depression at a 4, hopelessness at a 3 and her anxiety at a 10. Admits to thoughts of SI while in the hospital.  A) Given support reassurance and praise. Encouragement given. In a 1:1 Pt talked about the fact that she still felt a suicidal but felt safe within the hospital. Contracted for safety R) Given support. Remains Suicidal but is contracting for her safety while in the hospital.
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
## 2015-12-04 NOTE — Progress Notes (Signed)
D    Pt is pleasant on approach and cooperative   She is appropriate and compliant with treatment   She interacts appropriately with others   Pt said she met her goal today of attending all of her group meetings A    Verbal support given   Medications administered and effectiveness monitored   Q 15 min checks R    Pt is safe at present time

## 2015-12-05 DIAGNOSIS — Z818 Family history of other mental and behavioral disorders: Secondary | ICD-10-CM | POA: Diagnosis not present

## 2015-12-05 DIAGNOSIS — Z79899 Other long term (current) drug therapy: Secondary | ICD-10-CM | POA: Diagnosis not present

## 2015-12-05 DIAGNOSIS — Z808 Family history of malignant neoplasm of other organs or systems: Secondary | ICD-10-CM | POA: Diagnosis not present

## 2015-12-05 DIAGNOSIS — F1920 Other psychoactive substance dependence, uncomplicated: Secondary | ICD-10-CM | POA: Diagnosis not present

## 2015-12-05 MED ORDER — TRAZODONE HCL 50 MG PO TABS: 50.0000 mg | ORAL_TABLET | Freq: Every evening | ORAL | 0 refills | Status: DC | PRN

## 2015-12-05 MED ORDER — THIAMINE HCL 100 MG PO TABS: 100.0000 mg | ORAL_TABLET | Freq: Every day | ORAL | 0 refills | Status: DC

## 2015-12-05 MED ORDER — QUETIAPINE FUMARATE 25 MG PO TABS: 25.0000 mg | ORAL_TABLET | Freq: Two times a day (BID) | ORAL | 0 refills | Status: DC | PRN

## 2015-12-05 MED ORDER — QUETIAPINE FUMARATE ER 50 MG PO TB24: 100.0000 mg | ORAL_TABLET | Freq: Every day | ORAL | 0 refills | Status: DC

## 2015-12-05 MED ORDER — NICOTINE POLACRILEX 2 MG MT GUM: 2.0000 mg | CHEWING_GUM | OROMUCOSAL | 0 refills | Status: DC | PRN

## 2015-12-05 MED ORDER — PAROXETINE HCL 20 MG PO TABS: 20.0000 mg | ORAL_TABLET | Freq: Every day | ORAL | 0 refills | Status: DC

## 2015-12-05 NOTE — [facility] Suicide Risk Assessment (Signed)
[place] INPATIENT:  Family/Significant Other Suicide Prevention Education  Suicide Prevention Education:  Patient Refusal for Family/Significant Other Suicide Prevention Education: The patient [name] has refused to provide written consent for family/significant other to be provided Family/Significant Other Suicide Prevention Education during admission and/or prior to discharge.  Physician notified. SPE reviewed with patient and brochure provided. Patient encouraged to return to hospital if having suicidal thoughts, patient verbalized his/her understanding and has no further questions at this time.   Fujie Dickison L Jull Harral 12/05/2015, 10:13 AM

## 2015-12-05 NOTE — [facility] Group Notes (Signed)
[name] was invited to attend group.   Did not attend      spiritual care group on grief and loss facilitated by chaplain Jerene Pitch   Group opened with brief discussion and psycho-social ed around grief and loss in relationships and in relation to self - identifying life patterns, circumstances, changes that cause losses. Established group norm of speaking from own life experience. Group goal of establishing open and affirming space for members to share loss and experience with grief, normalize grief experience and provide psycho social education and grief support.

## 2015-12-05 NOTE — Progress Notes (Signed)
  [facility] Adult Case Management Discharge Plan :  Will you be returning to the same living situation after discharge:  Yes,  patient plans to return home At discharge, do you have transportation home?: Yes,  friends or family Do you have the ability to pay for your medications: Yes,  patient will be provided with prescriptions at discharge  Release of information consent forms completed and in the chart;  Patient's signature needed at discharge.  Patient to Follow up at: Follow-up Information    [facility] .   Why:  Referral faxed: 11/30/15. If interested in treatment, please contact admissions coordinator every 2-3 days regarding bed availability.  Contact information: [address] [place], [place] [address] Phone: [phone] Fax: [phone]       [facility] Follow up on 12/07/2015.   Why:  Hospital follow up assessment for outpatient therapy and medication services on Weds Nov. 1st. Walk-in between 8-9am and bring ID, social security card, proof of household [employer] cards. Call office if you need to reschedule.  Contact information: [address] [place] [address] [phone]           Next level of care provider has access to [place] and Suicide Prevention discussed: Yes,  with patient   Have you used any form of tobacco in the last 30 days? (Cigarettes, Smokeless Tobacco, Cigars, and/or Pipes): Yes  Has patient been referred to the Quitline?: Patient refused referral  Patient has been referred for addiction treatment: Yes  Katheline Brendlinger L Aisea Bouldin 12/05/2015, 10:22 AM

## 2015-12-05 NOTE — Progress Notes (Signed)
D:  Patient's self inventory sheet, patient has fair sleep, sleep medication not helpful.  Poor appetite, low energy level, poor concentration.  Rated depression 7, hopeless and anxiety 10.  Withdrawals, tremors, chilling, nausea.  SI, contracts, plans to cut herself if discharged.  Upset over discharge.  Neck/back spasms.  No where to go, upset over discharge.   A:  Medications administered per MD orders.  Emotional support and encouragement given patient. R:  SI at lunch, denied SI this morning with nurse, contracts for safety.  Denied HI.  Plans to cut self if discharged today.  Denied A/V hallucinations.  Safety maintained with 15 minute checks.
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
## 2015-12-05 NOTE — Progress Notes (Signed)
Patient stated she is suicidal if she has to discharge today, no where to go.  MD informed.  Will not hurt herself while a patient at [facility], contracts for safety.  Plans to cut herself if discharged.  SW to talk with patient also.
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
## 2015-12-05 NOTE — Progress Notes (Signed)
[facility] MD Progress Note  12/05/2015 2:00 PM  Patient Active Problem List   Diagnosis Date Noted  . MDD (major depressive disorder) 11/28/2015  . Bipolar disorder, curr episode mixed, severe, w/o psychotic features ([place]) 07/18/2015  . Cocaine use disorder, mild, abuse 07/18/2015  . Opioid use disorder, mild, abuse 07/18/2015  . History of ADHD 07/18/2015  . BV (bacterial vaginosis)   . Cannabis use disorder, mild, abuse 06/18/2014  . Seizure disorder ([place]) 06/18/2014  . PTSD (post-traumatic stress disorder) 06/16/2014  . Polysubstance (including opioids) dependence with physiol dependence ([place]) 02/13/2013    Diagnosis: Opiate use disorder severe  Subjective: Patient was scheduled to be released today but reports that she is having feelings of cutting if she is released and does not feel ready to go. We discussed that she is having anxiety about separating from the unit and it is unclear whether staying another day would resolve that. Patient however has just started working with a new Education officer, museum and they are working on setting up some placement for follow-up plans which would help the patient feel more secure and should be in place by tomorrow. At present patient does not feel safe to be released.  Objective: Well-developed well-nourished man female in no apparent distress pleasant and cooperative mood is anxious affect is congruent thought processes linear and goal directed thought content reports not increased anxiety and thoughts of cutting if released, alert and oriented 3 insight and judgment are limited IQ appears an average range         Current Facility-Administered Medications (Analgesics):  .  acetaminophen (TYLENOL) tablet 650 mg     Current Facility-Administered Medications (Other):  .  0.9 %  sodium chloride infusion .  alum & mag hydroxide-simeth (MAALOX/MYLANTA) 200-200-20 MG/5ML suspension 30 mL .  lidocaine (LIDODERM) 5 % 1 patch .  magnesium hydroxide (MILK  OF MAGNESIA) suspension 30 mL .  methocarbamol (ROBAXIN) tablet 750 mg .  multivitamin with minerals tablet 1 tablet .  nicotine polacrilex (NICORETTE) gum 2 mg .  PARoxetine (PAXIL) tablet 20 mg .  QUEtiapine (SEROQUEL XR) 24 hr tablet 100 mg .  QUEtiapine (SEROQUEL) tablet 25 mg .  thiamine (VITAMIN B-1) tablet 100 mg .  traZODone (DESYREL) tablet 50 mg  Current Outpatient Prescriptions (Other):  .  nicotine polacrilex (NICORETTE) 2 MG gum, Take 1 each (2 mg total) by mouth as needed for smoking cessation. Marland Kitchen  PARoxetine (PAXIL) 20 MG tablet, Take 1 tablet (20 mg total) by mouth at bedtime. Marland Kitchen  QUEtiapine (SEROQUEL XR) 50 MG TB24 24 hr tablet, Take 2 tablets (100 mg total) by mouth at bedtime. Marland Kitchen  QUEtiapine (SEROQUEL) 25 MG tablet, Take 1 tablet (25 mg total) by mouth 2 (two) times daily as needed (anxiety). Derrill Memo ON 12/06/2015] thiamine 100 MG tablet, Take 1 tablet (100 mg total) by mouth daily. .  traZODone (DESYREL) 50 MG tablet, Take 1 tablet (50 mg total) by mouth at bedtime as needed for sleep.  Vital Signs:Blood pressure (!) 93/50, pulse 82, temperature 98.4 F (36.9 C), temperature source Oral, resp. rate 18, height 5\' 5"  (1.651 m), weight 81.6 kg (180 lb), last menstrual period 11/22/2015, SpO2 99 %.    Lab Results: No results found for this or any previous visit (from the past 48 hour(s)).  Physical Findings: AIMS: Facial and Oral Movements Muscles of Facial Expression: None, normal Lips and Perioral Area: None, normal Jaw: None, normal Tongue: None, normal,Extremity Movements Upper (arms, wrists, hands, fingers):  None, normal Lower (legs, knees, ankles, toes): None, normal, Trunk Movements Neck, shoulders, hips: None, normal, Overall Severity Severity of abnormal movements (highest score from questions above): None, normal Incapacitation due to abnormal movements: None, normal Patient's awareness of abnormal movements (rate only patient's report): No Awareness,  Dental Status Current problems with teeth and/or dentures?: No Does patient usually wear dentures?: No  CIWA:  CIWA-Ar Total: 1 COWS:  COWS Total Score: 2   Assessment/Plan: Patient having difficulty separating from the unit and reports increased anxiety with belief that she will cut herself if released. We discussed what would or would not work to alleviate these problems and it does appear there are some things that may be put in place that will make her feel more secure by tomorrow and therefore we will hold her on the unit tonight and reevaluate tomorrow. Patient is aware that it may be more therapeutic for her to confront her anxiety and at some point this is going to have to be done.  Linard Millers, MD 12/05/2015, 2:00 PM
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
## 2015-12-05 NOTE — Progress Notes (Signed)
Recreation Therapy Notes  Date: 12/05/15 Time: 0930 Location: 300 Hall Dayroom  Group Topic: Stress Management  Goal Area(s) Addresses:  Patient will verbalize importance of using healthy stress management.  Patient will identify positive emotions associated with healthy stress management.   Intervention: Calm App  Activity :  Body Scan Meditation.  LRT introduced the stress management technique of meditation.  LRT played a body scan meditation from the Calm app to allow the patients to engage in the activity.  Patients were to follow along as the meditation was being played.  Education:  Stress Management, Discharge Planning.   Education Outcome: Acknowledges edcuation/In group clarification offered/Needs additional education  Clinical Observations/Feedback: Pt did not attend group.    [name], LRT/CTRS         [name] A 12/05/2015 12:16 PM
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
## 2015-12-05 NOTE — Progress Notes (Signed)
Educated patient on community resources and staff is available at all times for patients. Educated patient on medications. Patient contracted to safety.
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
## 2015-12-05 NOTE — BHH Group Notes (Signed)
[place] LCSW Group Therapy 12/05/2015  1:15 pm  Type of Therapy: Group Therapy Participation Level: Active  Participation Quality: Attentive, Sharing and Supportive  Affect: Appropriate  Cognitive: Alert and Oriented  Insight: Developing/Improving and Engaged  Engagement in Therapy: Developing/Improving and Engaged  Modes of Intervention: Clarification, Confrontation, Discussion, Education, Exploration,  Limit-setting, Orientation, Problem-solving, Rapport Building, Art therapist, Socialization and Support  Summary of Progress/Problems: Pt identified obstacles faced currently and processed barriers involved in overcoming these obstacles. Pt identified steps necessary for overcoming these obstacles and explored motivation (internal and external) for facing these difficulties head on. Pt further identified one area of concern in their lives and chose a goal to focus on for today. Patient discussed past traumas as well as uncertainty in her life regarding "where I go from here." Patient reports significant financial and housing stressors.   Tilden Fossa, LCSW Clinical Social Worker [facility] [phone]

## 2015-12-06 MED ORDER — QUETIAPINE FUMARATE 100 MG PO TABS
100.0000 mg | ORAL_TABLET | Freq: Every day | ORAL | Status: DC
  Filled 2015-12-06: qty 7
  Filled 2015-12-06: qty 1

## 2015-12-06 MED ORDER — QUETIAPINE FUMARATE 100 MG PO TABS: 100.0000 mg | ORAL_TABLET | Freq: Every day | ORAL | 0 refills | Status: DC
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
## 2015-12-06 NOTE — Progress Notes (Signed)
Patient discharged per physician order; patient denies SI/HI and A/V hallucinations; patient received prescriptions, samples, AVS, copy of the suicide safety plan, suicide risk assessment note, and transition record given to the patient after it was reviewed; patient had no other questions or concerns at this time; patient verbalized and signed that all belongings were returned; patient left the unit ambulatory

## 2015-12-06 NOTE — Progress Notes (Signed)
Recreation Therapy Notes  Animal-Assisted Activity (AAA) Program Checklist/Progress Notes Patient Eligibility Criteria Checklist & Daily Group note for Rec TxIntervention  Date: 10.31.2017 Time: 2:45pm Location: [address]    AAA/T Program Assumption of Risk Form signed by Patient/ or Parent Legal Guardian Yes  Patient is free of allergies or sever asthma Yes  Patient reports no fear of animals Yes  Patient reports no history of cruelty to animals Yes  Patient understands his/her participation is voluntary Yes  Behavioral Response: Did not attend.   Laureen Ochs Parks Czajkowski, LRT/CTRS         Eion Timbrook L 12/06/2015 2:56 PM

## 2015-12-06 NOTE — Progress Notes (Signed)
Slept 6.50 hours
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
## 2015-12-06 NOTE — Progress Notes (Signed)
No beds available at [facility], or [facility] today.   Tilden Fossa, LCSW Clinical Social Worker [facility] [phone]

## 2015-12-06 NOTE — Progress Notes (Signed)
Chaplain providing brief support with pt on unit due to multiple admissions.

## 2015-12-19 ENCOUNTER — Emergency Department (HOSPITAL_COMMUNITY)
Admission: EM | Admit: 2015-12-19 | Discharge: 2015-12-19 | Disposition: A | Discharge status: Home / Self Care | Payer: Medicaid Other | Attending: Emergency Medicine | Admitting: Emergency Medicine

## 2015-12-19 ENCOUNTER — Encounter (HOSPITAL_COMMUNITY): Payer: Self-pay | Admitting: Emergency Medicine

## 2015-12-19 DIAGNOSIS — W57XXXA Bitten or stung by nonvenomous insect and other nonvenomous arthropods, initial encounter: Secondary | ICD-10-CM | POA: Insufficient documentation

## 2015-12-19 DIAGNOSIS — Y999 Unspecified external cause status: Secondary | ICD-10-CM | POA: Insufficient documentation

## 2015-12-19 DIAGNOSIS — Z79899 Other long term (current) drug therapy: Secondary | ICD-10-CM | POA: Insufficient documentation

## 2015-12-19 DIAGNOSIS — Y929 Unspecified place or not applicable: Secondary | ICD-10-CM | POA: Insufficient documentation

## 2015-12-19 DIAGNOSIS — F909 Attention-deficit hyperactivity disorder, unspecified type: Secondary | ICD-10-CM | POA: Insufficient documentation

## 2015-12-19 DIAGNOSIS — F17210 Nicotine dependence, cigarettes, uncomplicated: Secondary | ICD-10-CM | POA: Insufficient documentation

## 2015-12-19 DIAGNOSIS — Y939 Activity, unspecified: Secondary | ICD-10-CM | POA: Insufficient documentation

## 2015-12-19 DIAGNOSIS — L03113 Cellulitis of right upper limb: Secondary | ICD-10-CM | POA: Insufficient documentation

## 2015-12-19 DIAGNOSIS — L0390 Cellulitis, unspecified: Secondary | ICD-10-CM

## 2015-12-19 MED ORDER — CEPHALEXIN 500 MG PO CAPS: 500.0000 mg | ORAL_CAPSULE | Freq: Four times a day (QID) | ORAL | 0 refills | Status: DC

## 2015-12-19 NOTE — Discharge Instructions (Signed)
Keflex as prescribed.  Local wound care with dressing changes and bacitracin twice daily.  Return to the emergency department if your symptoms significantly worsen or change.

## 2015-12-19 NOTE — ED Provider Notes (Signed)
[facility] DEPT Provider Note   CSN: [id number] Arrival date & time: 12/19/15  1103     History   Chief Complaint Chief Complaint  Patient presents with  . Insect Bite    HPI [name] is a 35 y.o. female.  Patient is a 35 year old female with history of ADHD, bipolar, and substance abuse. She presents for evaluation of a sore on her right hand. She states that this started this morning and has worsened. She believes that she was bitten by a spider. She denies any IV drug use. She denies any fevers or chills.   The history is provided by the patient.    Past Medical History:  Diagnosis Date  . ADD (attention deficit disorder)   . ADHD (attention deficit hyperactivity disorder)   . Anxiety   . Arthritis   . Bipolar 1 disorder ([place])   . Bipolar depression ([place])   . Degenerative disc disease   . Depression   . PTSD (post-traumatic stress disorder)     Patient Active Problem List   Diagnosis Date Noted  . MDD (major depressive disorder) 11/28/2015  . Bipolar disorder, curr episode mixed, severe, w/o psychotic features ([place]) 07/18/2015  . Cocaine use disorder, mild, abuse 07/18/2015  . Opioid use disorder, mild, abuse 07/18/2015  . History of ADHD 07/18/2015  . BV (bacterial vaginosis)   . Cannabis use disorder, mild, abuse 06/18/2014  . Seizure disorder ([place]) 06/18/2014  . PTSD (post-traumatic stress disorder) 06/16/2014  . Polysubstance (including opioids) dependence with physiol dependence ([place]) 02/13/2013    History reviewed. No pertinent surgical history.  OB History    Gravida Para Term Preterm AB Living   4 2 2   2      SAB TAB Ectopic Multiple Live Births   2               Home Medications    Prior to Admission medications   Medication Sig Start Date End Date Taking? Authorizing Provider  nicotine (NICODERM CQ - DOSED IN MG/24 HOURS) 21 mg/24hr patch Place 21 mg onto the skin daily.   Yes Historical Provider, MD  PARoxetine (PAXIL) 20 MG  tablet Take 1 tablet (20 mg total) by mouth at bedtime. 12/05/15  Yes Benjamine Mola, FNP  QUEtiapine (SEROQUEL) 100 MG tablet Take 1 tablet (100 mg total) by mouth at bedtime. 12/06/15  Yes Benjamine Mola, FNP  QUEtiapine (SEROQUEL) 25 MG tablet Take 1 tablet (25 mg total) by mouth 2 (two) times daily as needed (anxiety). 12/05/15  Yes Benjamine Mola, FNP  thiamine 100 MG tablet Take 1 tablet (100 mg total) by mouth daily. 12/06/15  Yes Benjamine Mola, FNP  traZODone (DESYREL) 50 MG tablet Take 1 tablet (50 mg total) by mouth at bedtime as needed for sleep. 12/05/15  Yes Benjamine Mola, FNP    Family History Family History  Problem Relation Age of Onset  . Cancer Father   . Cancer Mother   . Mental illness Mother     Social History Social History  Substance Use Topics  . Smoking status: Current Every Day Smoker    Packs/day: 0.50    Years: 17.00    Types: Cigarettes  . Smokeless tobacco: Never Used     Comment: last use 1.5 years ago.  . Alcohol use No     Comment: occasionally     Allergies   Geodon [ziprasidone hcl] and Prednisone   Review of Systems Review of Systems  All other systems reviewed and are negative.    Physical Exam Updated Vital Signs BP 120/63 (BP Location: Left Arm)   Pulse 78   Temp 97.5 F (36.4 C) (Oral)   Resp 20   Ht 5\' 5"  (1.651 m)   Wt 165 lb (74.8 kg)   LMP 12/18/2015   SpO2 100%   BMI 27.46 kg/m   Physical Exam  Constitutional: She is oriented to person, place, and time. She appears well-developed and well-nourished.  HENT:  Head: Normocephalic and atraumatic.  Neck: Normal range of motion. Neck supple.  Musculoskeletal:  There is a 1 cm round, open, sore to the dorsum of the right hand. There is mild surrounding erythema. There is no streaking into the arm.  Neurological: She is alert and oriented to person, place, and time.  Skin: Skin is warm and dry.  Nursing note and vitals reviewed.    ED Treatments / Results   Labs (all labs ordered are listed, but only abnormal results are displayed) Labs Reviewed - No data to display  EKG  EKG Interpretation None       Radiology No results found.  Procedures Procedures (including critical care time)  Medications Ordered in ED Medications - No data to display   Initial Impression / Assessment and Plan / ED Course  I have reviewed the triage vital signs and the nursing notes.  Pertinent labs & imaging results that were available during my care of the patient were reviewed by me and considered in my medical decision making (see chart for details).  Clinical Course     This appears to be an ulcer to the dorsum of her hand she states is due to a spider bite. This will be treated with Keflex and when necessary return.  I have reviewed her medical history and suspect this patient may be using IV drugs, however she adamantly denies this.  Final Clinical Impressions(s) / ED Diagnoses   Final diagnoses:  None    New Prescriptions New Prescriptions   No medications on file     Veryl Speak, MD 12/19/15 1211

## 2015-12-19 NOTE — ED Triage Notes (Signed)
Pt reports she was bitten by a spider this am, deep wound to her R hand. Pt states she was unable to see what type of spider bit her. Bleeding controlled in Triage.

## 2015-12-20 ENCOUNTER — Emergency Department (HOSPITAL_COMMUNITY)
Admission: EM | Admit: 2015-12-20 | Discharge: 2015-12-20 | Disposition: A | Discharge status: Home / Self Care | Payer: Medicaid Other | Attending: Emergency Medicine | Admitting: Emergency Medicine

## 2015-12-20 ENCOUNTER — Encounter (HOSPITAL_COMMUNITY): Payer: Self-pay | Admitting: Emergency Medicine

## 2015-12-20 DIAGNOSIS — W57XXXD Bitten or stung by nonvenomous insect and other nonvenomous arthropods, subsequent encounter: Secondary | ICD-10-CM | POA: Insufficient documentation

## 2015-12-20 DIAGNOSIS — F909 Attention-deficit hyperactivity disorder, unspecified type: Secondary | ICD-10-CM | POA: Insufficient documentation

## 2015-12-20 DIAGNOSIS — F17210 Nicotine dependence, cigarettes, uncomplicated: Secondary | ICD-10-CM | POA: Insufficient documentation

## 2015-12-20 DIAGNOSIS — Z79899 Other long term (current) drug therapy: Secondary | ICD-10-CM | POA: Insufficient documentation

## 2015-12-20 DIAGNOSIS — L03119 Cellulitis of unspecified part of limb: Secondary | ICD-10-CM

## 2015-12-20 DIAGNOSIS — L03113 Cellulitis of right upper limb: Secondary | ICD-10-CM | POA: Insufficient documentation

## 2015-12-20 MED ORDER — CEPHALEXIN 500 MG PO CAPS: 500.0000 mg | ORAL_CAPSULE | Freq: Four times a day (QID) | ORAL | 0 refills | Status: DC

## 2015-12-20 NOTE — ED Triage Notes (Signed)
Pt here for recheck of insect bite to her R hand. Pt is homeless and has been unable to get her medication filled. Hand is increasingly red and pt reports today the wound is seeping pus.

## 2015-12-20 NOTE — ED Notes (Signed)
Pt reports possible brown recluse spider bite to right hand. Redness and swelling noted.

## 2016-01-02 NOTE — ED Provider Notes (Signed)
[facility] DEPT Provider Note   CSN: [id number] Arrival date & time: 12/20/15  1216     History   Chief Complaint Chief Complaint  Patient presents with  . Insect Bite    HPI [name] is a 35 y.o. female.  HPI   35 year old female presented for evaluation of right hand wound. She was just evaluated the emergency room yesterday for the same. She thinks she has been by a spider. She is prescribed antibiotics yesterday but cannot tell him she could not afford them. Her symptoms have been relatively stable stitch was last seen. There is been some drainage from the wound. No fevers or chills. She has a past history of drug abuse. She denies recent injections in this area.  Past Medical History:  Diagnosis Date  . ADD (attention deficit disorder)   . ADHD (attention deficit hyperactivity disorder)   . Anxiety   . Arthritis   . Bipolar 1 disorder ([place])   . Bipolar depression ([place])   . Degenerative disc disease   . Depression   . PTSD (post-traumatic stress disorder)     Patient Active Problem List   Diagnosis Date Noted  . MDD (major depressive disorder) 11/28/2015  . Bipolar disorder, curr episode mixed, severe, w/o psychotic features ([place]) 07/18/2015  . Cocaine use disorder, mild, abuse 07/18/2015  . Opioid use disorder, mild, abuse 07/18/2015  . History of ADHD 07/18/2015  . BV (bacterial vaginosis)   . Cannabis use disorder, mild, abuse 06/18/2014  . Seizure disorder ([place]) 06/18/2014  . PTSD (post-traumatic stress disorder) 06/16/2014  . Polysubstance (including opioids) dependence with physiol dependence ([place]) 02/13/2013    History reviewed. No pertinent surgical history.  OB History    Gravida Para Term Preterm AB Living   4 2 2   2      SAB TAB Ectopic Multiple Live Births   2               Home Medications    Prior to Admission medications   Medication Sig Start Date End Date Taking? Authorizing Provider  cephALEXin (KEFLEX) 500 MG capsule  Take 1 capsule (500 mg total) by mouth 4 (four) times daily. 12/20/15   Virgel Manifold, MD  nicotine (NICODERM CQ - DOSED IN MG/24 HOURS) 21 mg/24hr patch Place 21 mg onto the skin daily.    Historical Provider, MD  PARoxetine (PAXIL) 20 MG tablet Take 1 tablet (20 mg total) by mouth at bedtime. 12/05/15   Benjamine Mola, FNP  QUEtiapine (SEROQUEL) 100 MG tablet Take 1 tablet (100 mg total) by mouth at bedtime. 12/06/15   Benjamine Mola, FNP  QUEtiapine (SEROQUEL) 25 MG tablet Take 1 tablet (25 mg total) by mouth 2 (two) times daily as needed (anxiety). 12/05/15   Benjamine Mola, FNP  thiamine 100 MG tablet Take 1 tablet (100 mg total) by mouth daily. 12/06/15   Benjamine Mola, FNP  traZODone (DESYREL) 50 MG tablet Take 1 tablet (50 mg total) by mouth at bedtime as needed for sleep. 12/05/15   Benjamine Mola, FNP    Family History Family History  Problem Relation Age of Onset  . Cancer Father   . Cancer Mother   . Mental illness Mother     Social History Social History  Substance Use Topics  . Smoking status: Current Every Day Smoker    Packs/day: 0.50    Years: 17.00    Types: Cigarettes  . Smokeless tobacco: Never Used  Comment: last use 1.5 years ago.  . Alcohol use No     Comment: occasionally     Allergies   Geodon [ziprasidone hcl] and Prednisone   Review of Systems Review of Systems   All systems reviewed and negative, other than as noted in HPI.    Physical Exam Updated Vital Signs BP 114/90   Pulse 82   Temp 97.6 F (36.4 C) (Oral)   Resp 18   Ht 5\' 5"  (1.651 m)   Wt 165 lb (74.8 kg)   LMP 12/18/2015   SpO2 100%   BMI 27.46 kg/m   Physical Exam  Constitutional: She appears well-developed and well-nourished. No distress.  HENT:  Head: Normocephalic and atraumatic.  Eyes: Conjunctivae are normal. Right eye exhibits no discharge. Left eye exhibits no discharge.  Neck: Neck supple.  Cardiovascular: Normal rate, regular rhythm and normal heart  sounds.  Exam reveals no gallop and no friction rub.   No murmur heard. Pulmonary/Chest: Effort normal and breath sounds normal. No respiratory distress.  Abdominal: Soft. She exhibits no distension. There is no tenderness.  Musculoskeletal: She exhibits no edema or tenderness.  Neurological: She is alert.  Skin: Skin is warm and dry.  Over the dorsum of the right hand there is a macerated/ulcerated wound. Roughly circular. Approximately 1 cm in size. Minimal serous appearing drainage. No fluctuance. Very mild erythema at the wound margins. There is no streaking. Neurovascularly intact.  Psychiatric: She has a normal mood and affect. Her behavior is normal. Thought content normal.  Nursing note and vitals reviewed.    ED Treatments / Results  Labs (all labs ordered are listed, but only abnormal results are displayed) Labs Reviewed - No data to display  EKG  EKG Interpretation None       Radiology No results found.  Procedures Procedures (including critical care time)  Medications Ordered in ED Medications - No data to display   Initial Impression / Assessment and Plan / ED Course  I have reviewed the triage vital signs and the nursing notes.  Pertinent labs & imaging results that were available during my care of the patient were reviewed by me and considered in my medical decision making (see chart for details).  Clinical Course     35 year old female with wound to the dorsum of the right hand. Do not feel antibiotics she cannot afford him. There is no drainable collection on my exam today. Antibiotics on $4 list. She was given money to fill script. Continued care and return precautions were reiterated.  Final Clinical Impressions(s) / ED Diagnoses   Final diagnoses:  Cellulitis of upper extremity, unspecified laterality    New Prescriptions Discharge Medication List as of 12/20/2015  1:03 PM       Virgel Manifold, MD 01/02/16 1110

## 2016-02-10 ENCOUNTER — Emergency Department (HOSPITAL_COMMUNITY)
Admission: EM | Admit: 2016-02-10 | Discharge: 2016-02-10 | Disposition: A | Discharge status: Home / Self Care | Payer: Self-pay | Attending: Emergency Medicine | Admitting: Emergency Medicine

## 2016-02-10 ENCOUNTER — Emergency Department (HOSPITAL_COMMUNITY): Payer: Self-pay

## 2016-02-10 ENCOUNTER — Encounter (HOSPITAL_COMMUNITY): Payer: Self-pay | Admitting: Emergency Medicine

## 2016-02-10 DIAGNOSIS — F1490 Cocaine use, unspecified, uncomplicated: Secondary | ICD-10-CM | POA: Insufficient documentation

## 2016-02-10 DIAGNOSIS — Z79899 Other long term (current) drug therapy: Secondary | ICD-10-CM | POA: Insufficient documentation

## 2016-02-10 DIAGNOSIS — W1800XA Striking against unspecified object with subsequent fall, initial encounter: Secondary | ICD-10-CM | POA: Insufficient documentation

## 2016-02-10 DIAGNOSIS — F17210 Nicotine dependence, cigarettes, uncomplicated: Secondary | ICD-10-CM | POA: Insufficient documentation

## 2016-02-10 DIAGNOSIS — S40021A Contusion of right upper arm, initial encounter: Secondary | ICD-10-CM | POA: Insufficient documentation

## 2016-02-10 DIAGNOSIS — F909 Attention-deficit hyperactivity disorder, unspecified type: Secondary | ICD-10-CM | POA: Insufficient documentation

## 2016-02-10 DIAGNOSIS — S0093XA Contusion of unspecified part of head, initial encounter: Secondary | ICD-10-CM | POA: Insufficient documentation

## 2016-02-10 DIAGNOSIS — Y929 Unspecified place or not applicable: Secondary | ICD-10-CM | POA: Insufficient documentation

## 2016-02-10 DIAGNOSIS — Y939 Activity, unspecified: Secondary | ICD-10-CM | POA: Insufficient documentation

## 2016-02-10 DIAGNOSIS — Y999 Unspecified external cause status: Secondary | ICD-10-CM | POA: Insufficient documentation

## 2016-02-10 DIAGNOSIS — S301XXA Contusion of abdominal wall, initial encounter: Secondary | ICD-10-CM | POA: Insufficient documentation

## 2016-02-10 DIAGNOSIS — W19XXXA Unspecified fall, initial encounter: Secondary | ICD-10-CM

## 2016-02-10 DIAGNOSIS — F1290 Cannabis use, unspecified, uncomplicated: Secondary | ICD-10-CM | POA: Insufficient documentation

## 2016-02-10 LAB — I-STAT CHEM 8, ED
BUN: 19 mg/dL (ref 6–20)
CREATININE: 0.9 mg/dL (ref 0.44–1.00)
Calcium, Ion: 1.22 mmol/L (ref 1.15–1.40)
Chloride: 103 mmol/L (ref 101–111)
Glucose, Bld: 90 mg/dL (ref 65–99)
HEMATOCRIT: 44 % (ref 36.0–46.0)
Hemoglobin: 15 g/dL (ref 12.0–15.0)
POTASSIUM: 3.6 mmol/L (ref 3.5–5.1)
Sodium: 141 mmol/L (ref 135–145)
TCO2: 26 mmol/L (ref 0–100)

## 2016-02-10 LAB — I-STAT BETA HCG BLOOD, ED (MC, WL, AP ONLY): I-stat hCG, quantitative: <5 m[IU]/mL (ref <5)

## 2016-02-10 MED ORDER — IOPAMIDOL (ISOVUE-300) INJECTION 61%
100.0000 mL | Freq: Once | INTRAVENOUS | Status: AC | PRN
  Administered 2016-02-10: 100 mL via INTRAVENOUS

## 2016-02-10 MED ORDER — ONDANSETRON HCL 4 MG/2ML IJ SOLN
4.0000 mg | Freq: Once | INTRAMUSCULAR | Status: AC
  Administered 2016-02-10: 4 mg via INTRAVENOUS
  Filled 2016-02-10: qty 2

## 2016-02-10 MED ORDER — IBUPROFEN 800 MG PO TABS: 800.0000 mg | ORAL_TABLET | Freq: Three times a day (TID) | ORAL | 0 refills | Status: DC | PRN

## 2016-02-10 MED ORDER — SODIUM CHLORIDE 0.9 % IV BOLUS (SEPSIS)
1000.0000 mL | Freq: Once | INTRAVENOUS | Status: AC
  Administered 2016-02-10: 1000 mL via INTRAVENOUS

## 2016-02-10 MED ORDER — HYDROMORPHONE HCL 1 MG/ML IJ SOLN
1.0000 mg | Freq: Once | INTRAMUSCULAR | Status: AC
  Administered 2016-02-10: 1 mg via INTRAVENOUS
  Filled 2016-02-10: qty 1

## 2016-02-10 NOTE — ED Notes (Signed)
Patient transported to CT 

## 2016-02-10 NOTE — Discharge Instructions (Signed)
Follow up with a family md as needed
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
## 2016-02-10 NOTE — ED Triage Notes (Signed)
Patient complains of left rib pain from fall. States pain is constant and worse when breathing in. Right lung sounds are diminished in lower quadrant.
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
## 2016-09-26 ENCOUNTER — Emergency Department (HOSPITAL_COMMUNITY)
Admission: EM | Admit: 2016-09-26 | Discharge: 2016-09-26 | Disposition: A | Discharge status: Home / Self Care | Payer: Medicaid Other | Attending: Emergency Medicine | Admitting: Emergency Medicine

## 2016-09-26 ENCOUNTER — Encounter (HOSPITAL_COMMUNITY): Payer: Self-pay | Admitting: *Deleted

## 2016-09-26 DIAGNOSIS — Y9389 Activity, other specified: Secondary | ICD-10-CM | POA: Insufficient documentation

## 2016-09-26 DIAGNOSIS — Z79899 Other long term (current) drug therapy: Secondary | ICD-10-CM | POA: Insufficient documentation

## 2016-09-26 DIAGNOSIS — Y998 Other external cause status: Secondary | ICD-10-CM | POA: Insufficient documentation

## 2016-09-26 DIAGNOSIS — F17210 Nicotine dependence, cigarettes, uncomplicated: Secondary | ICD-10-CM | POA: Insufficient documentation

## 2016-09-26 DIAGNOSIS — S40022A Contusion of left upper arm, initial encounter: Secondary | ICD-10-CM | POA: Insufficient documentation

## 2016-09-26 DIAGNOSIS — Y288XXA Contact with other sharp object, undetermined intent, initial encounter: Secondary | ICD-10-CM | POA: Insufficient documentation

## 2016-09-26 DIAGNOSIS — Y9289 Other specified places as the place of occurrence of the external cause: Secondary | ICD-10-CM | POA: Insufficient documentation

## 2016-09-26 LAB — CBC WITH DIFFERENTIAL/PLATELET
BASOS ABS: 0 10*3/uL (ref 0.0–0.1)
Basophils Relative: 0 %
EOS ABS: 0.2 10*3/uL (ref 0.0–0.7)
Eosinophils Relative: 3 %
HCT: 44.6 % (ref 36.0–46.0)
HEMOGLOBIN: 14.9 g/dL (ref 12.0–15.0)
LYMPHS ABS: 2.5 10*3/uL (ref 0.7–4.0)
LYMPHS PCT: 28 %
MCH: 27 pg (ref 26.0–34.0)
MCHC: 33.4 g/dL (ref 30.0–36.0)
MCV: 80.8 fL (ref 78.0–100.0)
Monocytes Absolute: 1 10*3/uL (ref 0.1–1.0)
Monocytes Relative: 12 %
NEUTROS PCT: 57 %
Neutro Abs: 5 10*3/uL (ref 1.7–7.7)
Platelets: 296 10*3/uL (ref 150–400)
RBC: 5.52 MIL/uL — AB (ref 3.87–5.11)
RDW: 13.2 % (ref 11.5–15.5)
WBC: 8.8 10*3/uL (ref 4.0–10.5)

## 2016-09-26 LAB — BASIC METABOLIC PANEL
Anion gap: 5 (ref 5–15)
BUN: 12 mg/dL (ref 6–20)
CHLORIDE: 105 mmol/L (ref 101–111)
CO2: 25 mmol/L (ref 22–32)
CREATININE: 0.77 mg/dL (ref 0.44–1.00)
Calcium: 8.6 mg/dL — ABNORMAL LOW (ref 8.9–10.3)
Glucose, Bld: 86 mg/dL (ref 65–99)
POTASSIUM: 4 mmol/L (ref 3.5–5.1)
SODIUM: 135 mmol/L (ref 135–145)

## 2016-09-26 MED ORDER — SODIUM CHLORIDE 0.9 % IV BOLUS (SEPSIS)
1000.0000 mL | Freq: Once | INTRAVENOUS | Status: AC
  Administered 2016-09-26: 1000 mL via INTRAVENOUS

## 2016-09-26 MED ORDER — PROMETHAZINE HCL 25 MG PO TABS: 25.0000 mg | ORAL_TABLET | Freq: Four times a day (QID) | ORAL | 0 refills | Status: DC | PRN

## 2016-09-26 MED ORDER — ONDANSETRON HCL 4 MG/2ML IJ SOLN
4.0000 mg | Freq: Once | INTRAMUSCULAR | Status: AC
  Administered 2016-09-26: 4 mg via INTRAVENOUS
  Filled 2016-09-26: qty 2
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
## 2016-09-26 NOTE — ED Provider Notes (Signed)
[facility] DEPT Provider Note   CSN: [id number] Arrival date & time: 09/26/16  1819     History   Chief Complaint Chief Complaint  Patient presents with  . Bleeding/Bruising    HPI [name] is a 36 y.o. female.  Pt presents to the ED today with a large hematoma to her left arm.  Pt donated plasma yesterday and developed swelling in the left arm, so the needle was removed and process finished in right arm.  Pt woke up with the hematoma to her left arm today.  She feels nausea and some pain.  She also has some numbness in her hand, but moves it well.      Past Medical History:  Diagnosis Date  . ADD (attention deficit disorder)   . ADHD (attention deficit hyperactivity disorder)   . Anxiety   . Arthritis   . Bipolar 1 disorder ([place])   . Bipolar depression ([place])   . Degenerative disc disease   . Depression   . PTSD (post-traumatic stress disorder)     Patient Active Problem List   Diagnosis Date Noted  . MDD (major depressive disorder) 11/28/2015  . Bipolar disorder, curr episode mixed, severe, w/o psychotic features ([place]) 07/18/2015  . Cocaine use disorder, mild, abuse 07/18/2015  . Opioid use disorder, mild, abuse 07/18/2015  . History of ADHD 07/18/2015  . BV (bacterial vaginosis)   . Cannabis use disorder, mild, abuse 06/18/2014  . Seizure disorder ([place]) 06/18/2014  . PTSD (post-traumatic stress disorder) 06/16/2014  . 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 No pertinent surgical history.  OB History    Gravida Para Term Preterm AB Living   4 2 2   2      SAB TAB Ectopic Multiple Live Births   2               Home Medications    Prior to Admission medications   Medication Sig Start Date End Date Taking? Authorizing Provider  cephALEXin (KEFLEX) 500 MG capsule Take 1 capsule (500 mg total) by mouth 4 (four) times daily. Patient not taking: Reported on 02/10/2016 12/20/15    Virgel Manifold, MD  citalopram (CELEXA) 20 MG tablet Take 20 mg by mouth daily.    [provider]  ibuprofen (ADVIL,MOTRIN) 800 MG tablet Take 1 tablet (800 mg total) by mouth every 8 (eight) hours as needed. 02/10/16   Milton Ferguson, MD  PARoxetine (PAXIL) 20 MG tablet Take 1 tablet (20 mg total) by mouth at bedtime. Patient not taking: Reported on 02/10/2016 12/05/15   Benjamine Mola, FNP  promethazine (PHENERGAN) 25 MG tablet Take 1 tablet (25 mg total) by mouth every 6 (six) hours as needed for nausea or vomiting. 09/26/16   Isla Pence, MD  QUEtiapine (SEROQUEL) 100 MG tablet Take 1 tablet (100 mg total) by mouth at bedtime. Patient not taking: Reported on 02/10/2016 12/06/15   Withrow, Elyse Jarvis, FNP  QUEtiapine (SEROQUEL) 25 MG tablet Take 1 tablet (25 mg total) by mouth 2 (two) times daily as needed (anxiety). Patient not taking: Reported on 02/10/2016 12/05/15   Benjamine Mola, FNP  thiamine 100 MG tablet Take 1 tablet (100 mg total) by mouth daily. Patient not taking: Reported on 02/10/2016 12/06/15   Benjamine Mola, FNP  traZODone (DESYREL) 50 MG tablet Take 1 tablet (50 mg total) by mouth at bedtime as needed for sleep. Patient taking differently: Take 100 mg by mouth at  bedtime as needed for sleep.  12/05/15   Withrow, Elyse Jarvis, FNP    Family History Family History  Problem Relation Age of Onset  . Cancer Father   . Cancer Mother   . Mental illness Mother     Social History Social History  Substance Use Topics  . Smoking status: Current Every Day Smoker    Packs/day: 0.25    Years: 17.00    Types: Cigarettes  . Smokeless tobacco: Never Used     Comment: last use 1.5 years ago.  . Alcohol use Yes     Comment: occasionally     Allergies   Geodon [ziprasidone hcl] and Prednisone   Review of Systems Review of Systems  Skin:       Large hematoma left arm  All other systems reviewed and are negative.    Physical Exam Updated Vital Signs BP 116/75 (BP  Location: Right Arm)   Pulse 99   Temp 97.9 F (36.6 C) (Oral)   Resp 20   Ht 5\' 5"  (1.651 m)   Wt 78.9 kg (174 lb)   LMP 09/02/2016   SpO2 99%   BMI 28.96 kg/m   Physical Exam  Constitutional: She is oriented to person, place, and time. She appears well-developed and well-nourished.  HENT:  Head: Normocephalic and atraumatic.  Right Ear: External ear normal.  Left Ear: External ear normal.  Nose: Nose normal.  Mouth/Throat: Oropharynx is clear and moist.  Eyes: Pupils are equal, round, and reactive to light. Conjunctivae and EOM are normal.  Neck: Normal range of motion. Neck supple.  Cardiovascular: Normal rate, regular rhythm, normal heart sounds and intact distal pulses.   Pulmonary/Chest: Effort normal and breath sounds normal.  Abdominal: Soft. Bowel sounds are normal.  Musculoskeletal:       Arms: Neurological: She is alert and oriented to person, place, and time.  Skin: Skin is warm.  Psychiatric: She has a normal mood and affect. Her behavior is normal. Judgment and thought content normal.  Nursing note and vitals reviewed.    ED Treatments / Results  Labs (all labs ordered are listed, but only abnormal results are displayed) Labs Reviewed  BASIC METABOLIC PANEL - Abnormal; Notable for the following:       Result Value   Calcium 8.6 (*)    All other components within normal limits  CBC WITH DIFFERENTIAL/PLATELET - Abnormal; Notable for the following:    RBC 5.52 (*)    All other components within normal limits    EKG  EKG Interpretation None       Radiology No results found.  Procedures Procedures (including critical care time)  Medications Ordered in ED Medications  sodium chloride 0.9 % bolus 1,000 mL (1,000 mLs Intravenous New Bag/Given 09/26/16 1914)  ondansetron (ZOFRAN) injection 4 mg (4 mg Intravenous Given 09/26/16 1914)     Initial Impression / Assessment and Plan / ED Course  I have reviewed the triage vital signs and the nursing  notes.  Pertinent labs & imaging results that were available during my care of the patient were reviewed by me and considered in my medical decision making (see chart for details).   h/h and plt ok.  Pt knows to rest, ice, and elevate arm.  She knows to return if sx worsen.   Final Clinical Impressions(s) / ED Diagnoses   Final diagnoses:  Hematoma of arm, left, initial encounter    New Prescriptions New Prescriptions   PROMETHAZINE (PHENERGAN) 25 MG TABLET  Take 1 tablet (25 mg total) by mouth every 6 (six) hours as needed for nausea or vomiting.     Isla Pence, MD 09/26/16 2003

## 2016-10-25 ENCOUNTER — Emergency Department (HOSPITAL_COMMUNITY)
Admission: EM | Admit: 2016-10-25 | Discharge: 2016-10-25 | Disposition: A | Discharge status: Home / Self Care | Payer: Self-pay | Attending: Emergency Medicine | Admitting: Emergency Medicine

## 2016-10-25 ENCOUNTER — Encounter (HOSPITAL_COMMUNITY): Payer: Self-pay | Admitting: *Deleted

## 2016-10-25 DIAGNOSIS — R197 Diarrhea, unspecified: Secondary | ICD-10-CM | POA: Insufficient documentation

## 2016-10-25 DIAGNOSIS — R1084 Generalized abdominal pain: Secondary | ICD-10-CM | POA: Insufficient documentation

## 2016-10-25 DIAGNOSIS — R109 Unspecified abdominal pain: Secondary | ICD-10-CM

## 2016-10-25 DIAGNOSIS — F17210 Nicotine dependence, cigarettes, uncomplicated: Secondary | ICD-10-CM | POA: Insufficient documentation

## 2016-10-25 DIAGNOSIS — K2900 Acute gastritis without bleeding: Secondary | ICD-10-CM | POA: Insufficient documentation

## 2016-10-25 HISTORY — DX: Malignant (primary) neoplasm, unspecified: C80.1

## 2016-10-25 LAB — TYPE AND SCREEN
ABO/RH(D): O POS
ANTIBODY SCREEN: NEGATIVE

## 2016-10-25 LAB — COMPREHENSIVE METABOLIC PANEL
ALT: 22 U/L (ref 14–54)
ANION GAP: 9 (ref 5–15)
AST: 22 U/L (ref 15–41)
Albumin: 4.1 g/dL (ref 3.5–5.0)
Alkaline Phosphatase: 46 U/L (ref 38–126)
BILIRUBIN TOTAL: 0.5 mg/dL (ref 0.3–1.2)
BUN: 12 mg/dL (ref 6–20)
CALCIUM: 9.1 mg/dL (ref 8.9–10.3)
CO2: 25 mmol/L (ref 22–32)
Chloride: 103 mmol/L (ref 101–111)
Creatinine, Ser: 0.71 mg/dL (ref 0.44–1.00)
GFR calc non Af Amer: >60 mL/min (ref >60)
Glucose, Bld: 113 mg/dL — ABNORMAL HIGH (ref 65–99)
POTASSIUM: 4.4 mmol/L (ref 3.5–5.1)
Sodium: 137 mmol/L (ref 135–145)
TOTAL PROTEIN: 6.9 g/dL (ref 6.5–8.1)

## 2016-10-25 LAB — URINALYSIS, ROUTINE W REFLEX MICROSCOPIC
Bilirubin Urine: NEGATIVE
Glucose, UA: NEGATIVE mg/dL
Hgb urine dipstick: NEGATIVE
Ketones, ur: NEGATIVE mg/dL
Leukocytes, UA: NEGATIVE
NITRITE: NEGATIVE
Protein, ur: NEGATIVE mg/dL
SPECIFIC GRAVITY, URINE: 1.019 (ref 1.005–1.030)
pH: 7 (ref 5.0–8.0)

## 2016-10-25 LAB — CBC
HCT: 45.1 % (ref 36.0–46.0)
HEMOGLOBIN: 15.1 g/dL — AB (ref 12.0–15.0)
MCH: 27.2 pg (ref 26.0–34.0)
MCHC: 33.5 g/dL (ref 30.0–36.0)
MCV: 81.1 fL (ref 78.0–100.0)
Platelets: 296 10*3/uL (ref 150–400)
RBC: 5.56 MIL/uL — AB (ref 3.87–5.11)
RDW: 13 % (ref 11.5–15.5)
WBC: 7.6 10*3/uL (ref 4.0–10.5)

## 2016-10-25 LAB — PREGNANCY, URINE: PREG TEST UR: NEGATIVE

## 2016-10-25 LAB — LIPASE, BLOOD: Lipase: 37 U/L (ref 11–51)

## 2016-10-25 MED ORDER — GI COCKTAIL ~~LOC~~
30.0000 mL | Freq: Once | ORAL | Status: AC
  Administered 2016-10-25: 30 mL via ORAL
  Filled 2016-10-25: qty 30

## 2016-10-25 MED ORDER — PANTOPRAZOLE SODIUM 40 MG IV SOLR
40.0000 mg | Freq: Once | INTRAVENOUS | Status: AC
  Administered 2016-10-25: 40 mg via INTRAVENOUS
  Filled 2016-10-25: qty 40

## 2016-10-25 MED ORDER — FAMOTIDINE 20 MG PO TABS: 20.0000 mg | ORAL_TABLET | Freq: Two times a day (BID) | ORAL | 0 refills | Status: DC

## 2016-10-25 MED ORDER — SODIUM CHLORIDE 0.9 % IV BOLUS (SEPSIS)
1000.0000 mL | Freq: Once | INTRAVENOUS | Status: AC
  Administered 2016-10-25: 1000 mL via INTRAVENOUS

## 2016-10-25 MED ORDER — FAMOTIDINE IN NACL 20-0.9 MG/50ML-% IV SOLN
20.0000 mg | INTRAVENOUS | Status: AC
  Administered 2016-10-25: 20 mg via INTRAVENOUS
  Filled 2016-10-25: qty 50

## 2016-10-25 MED ORDER — ONDANSETRON HCL 4 MG/2ML IJ SOLN
4.0000 mg | Freq: Once | INTRAMUSCULAR | Status: AC
  Administered 2016-10-25: 4 mg via INTRAVENOUS
  Filled 2016-10-25: qty 2

## 2016-10-25 NOTE — ED Triage Notes (Signed)
Pt comes in with abdominal pain, n/v/d. Stools are dark with bright red blood as well. Pt has hx of stomach cancer and has been in remission since last year. Pt states cancer started with same symptoms as these.

## 2016-10-25 NOTE — ED Provider Notes (Signed)
[facility] DEPT Provider Note   CSN: [id number] Arrival date & time: 10/25/16  0905     History   Chief Complaint Chief Complaint  Patient presents with  . Abdominal Pain    HPI [name] is a 36 y.o. female.  HPI  The patient is a 36 year old female, she has a known history of anxiety, bipolar disorder as well as multiple colonic and gastric polyps which required removal last year, was told that some of these were cancerous. She has not followed up with GI in the last 6 months, she has not had her antacids in the last 6 months and has done extremely well with normal bowel habits and no abdominal discomfort. Approximately one week ago she developed recurrent nausea with then ongoing looser stools which tended to become darker and more black appearing. This morning she noticed a coffee-ground appearance to her stools as well as some bright red blood. She also notes that she has been "living on Pepto" over the last week. She denies abdominal distention, she does have some epigastric discomfort but she thinks this is from recurrent heaving and vomiting. No prior history of abdominal surgery.  The patient denies any significant over-the-counter medication use including NSAIDs or aspirin-containing products  Past Medical History:  Diagnosis Date  . ADD (attention deficit disorder)   . ADHD (attention deficit hyperactivity disorder)   . Anxiety   . Arthritis   . Bipolar 1 disorder ([place])   . Bipolar depression ([place])   . Cancer ([place])    stomach   . Degenerative disc disease   . Depression   . PTSD (post-traumatic stress disorder)     Patient Active Problem List   Diagnosis Date Noted  . MDD (major depressive disorder) 11/28/2015  . Bipolar disorder, curr episode mixed, severe, w/o psychotic features ([place]) 07/18/2015  . Cocaine use disorder, mild, abuse 07/18/2015  . Opioid use disorder, mild, abuse 07/18/2015  . History of ADHD 07/18/2015  . BV (bacterial vaginosis)   .  Cannabis use disorder, mild, abuse 06/18/2014  . Seizure disorder ([place]) 06/18/2014  . PTSD (post-traumatic stress disorder) 06/16/2014  . Polysubstance (including opioids) dependence with physiol dependence ([place]) 02/13/2013    History reviewed. No pertinent surgical history.  OB History    Gravida Para Term Preterm AB Living   4 2 2   2      SAB TAB Ectopic Multiple Live Births   2               Home Medications    Prior to Admission medications   Medication Sig Start Date End Date Taking? Authorizing Provider  ibuprofen (ADVIL,MOTRIN) 800 MG tablet Take 1 tablet (800 mg total) by mouth every 8 (eight) hours as needed. 02/10/16  Yes Milton Ferguson, MD  traZODone (DESYREL) 50 MG tablet Take 1 tablet (50 mg total) by mouth at bedtime as needed for sleep. 12/05/15  Yes Withrow, Elyse Jarvis, FNP  famotidine (PEPCID) 20 MG tablet Take 1 tablet (20 mg total) by mouth 2 (two) times daily. 10/25/16   Noemi Chapel, MD    Family History Family History  Problem Relation Age of Onset  . Cancer Father   . Cancer Mother   . Mental illness Mother     Social History Social History  Substance Use Topics  . Smoking status: Current Every Day Smoker    Packs/day: 0.25    Years: 17.00    Types: Cigarettes  . Smokeless tobacco: Never Used  Comment: last use 1.5 years ago.  . Alcohol use Yes     Comment: occasionally     Allergies   Geodon [ziprasidone hcl] and Prednisone   Review of Systems Review of Systems  All other systems reviewed and are negative.    Physical Exam Updated Vital Signs BP 131/74 (BP Location: Right Arm)   Pulse 80   Temp 98.7 F (37.1 C) (Oral)   Resp 18   Ht 5\' 5"  (1.651 m)   Wt 77.1 kg (170 lb)   LMP 10/04/2016   SpO2 98%   BMI 28.29 kg/m   Physical Exam  Constitutional: She appears well-developed and well-nourished. No distress.  HENT:  Head: Normocephalic and atraumatic.  Mouth/Throat: Oropharynx is clear and moist. No oropharyngeal exudate.    Eyes: Pupils are equal, round, and reactive to light. Conjunctivae and EOM are normal. Right eye exhibits no discharge. Left eye exhibits no discharge. No scleral icterus.  Neck: Normal range of motion. Neck supple. No JVD present. No thyromegaly present.  Cardiovascular: Normal rate, regular rhythm, normal heart sounds and intact distal pulses.  Exam reveals no gallop and no friction rub.   No murmur heard. Pulmonary/Chest: Effort normal and breath sounds normal. No respiratory distress. She has no wheezes. She has no rales.  Abdominal: Soft. Bowel sounds are normal. She exhibits no distension and no mass. There is tenderness ( positive carnett's sign to the epigastrium, mild tenderness in this area but no other abdominal tenderness ).  Musculoskeletal: Normal range of motion. She exhibits no edema or tenderness.  Lymphadenopathy:    She has no cervical adenopathy.  Neurological: She is alert. Coordination normal.  Skin: Skin is warm and dry. No rash noted. No erythema.  Psychiatric: She has a normal mood and affect. Her behavior is normal.  Nursing note and vitals reviewed.     ED Treatments / Results  Labs (all labs ordered are listed, but only abnormal results are displayed) Labs Reviewed  COMPREHENSIVE METABOLIC PANEL - Abnormal; Notable for the following:       Result Value   Glucose, Bld 113 (*)    All other components within normal limits  CBC - Abnormal; Notable for the following:    RBC 5.56 (*)    Hemoglobin 15.1 (*)    All other components within normal limits  URINALYSIS, ROUTINE W REFLEX MICROSCOPIC - Abnormal; Notable for the following:    APPearance HAZY (*)    All other components within normal limits  LIPASE, BLOOD  PREGNANCY, URINE  POC OCCULT BLOOD, ED  TYPE AND SCREEN    Radiology No results found.  Procedures Procedures (including critical care time)  Procedure Note:  Anoscopy  Risks benefits alternatives of the procedure given to the  patient Verbal Consent obtained Patient placed in the lateral decubitus position Anoscopy performed Findings  haperone present for exam, the patient has normal appearing external anal structures, no signs of fissures or external hemorrhoids, no tenderness. Internal digital exam with no tenderness or masses palpated. Anoscopy shows no signs of bleeding, small amount of granular dark green to black colored stool in the rectal vault, no bleeding, no internal or external hemorrhoids, no bright red blood. Patient tolerated procedure without any complaints   Medications Ordered in ED Medications  sodium chloride 0.9 % bolus 1,000 mL (1,000 mLs Intravenous New Bag/Given 10/25/16 1128)  ondansetron (ZOFRAN) injection 4 mg (4 mg Intravenous Given 10/25/16 1128)  famotidine (PEPCID) IVPB 20 mg premix (20 mg Intravenous New  Bag/Given 10/25/16 1128)  pantoprazole (PROTONIX) injection 40 mg (40 mg Intravenous Given 10/25/16 1128)  gi cocktail (Maalox,Lidocaine,Donnatal) (30 mLs Oral Given 10/25/16 1128)     Initial Impression / Assessment and Plan / ED Course  I have reviewed the triage vital signs and the nursing notes.  Pertinent labs & imaging results that were available during my care of the patient were reviewed by me and considered in my medical decision making (see chart for details).     Would consider gastritis, peptic ulcer disease or other gastric process. She has no other abdominal tenderness, no cardiac or pulmonary symptoms and her lab work is unremarkable with no leukocytosis, no anemia and a normal lipase. She will need a urinalysis, endoscopy, digital rectal exam and Hemoccult. The color of her stool could be related to the Pepto-Bismol. There was a small amount of bright red blood in the picture that she showed me which could be related to a hemorrhoid or frequent wiping.  Rectal an anoscope exam without any acute findings. The Hemoccult sample is negative on my interpretation. I suspect  this is more related to the use of Pepto-Bismol. At this time the patient has normal labs, she has a normal Hemoccult, I anticipate that she can be safely discharged to follow-up in the outpatient setting. I will restart antacid medications.  Final Clinical Impressions(s) / ED Diagnoses   Final diagnoses:  Abdominal pain, unspecified abdominal location  Diarrhea, unspecified type  Acute gastritis without hemorrhage, unspecified gastritis type    New Prescriptions New Prescriptions   FAMOTIDINE (PEPCID) 20 MG TABLET    Take 1 tablet (20 mg total) by mouth 2 (two) times daily.     Noemi Chapel, MD 10/25/16 [id number]

## 2016-10-25 NOTE — Discharge Instructions (Signed)
Your testing has appeared normal, there is no blood in your stool, your blood tests are normal, we have given U antacid medications through your IV. You should continue the following medications at home:  Pepcid 20mg  by mouth twice daily Stop Pepto Bismal

## 2016-11-11 ENCOUNTER — Encounter (HOSPITAL_COMMUNITY): Payer: Self-pay

## 2016-11-11 ENCOUNTER — Emergency Department (HOSPITAL_COMMUNITY)
Admission: EM | Admit: 2016-11-11 | Discharge: 2016-11-12 | Disposition: A | Discharge status: Other Acute Inpatient Hospital | Payer: Self-pay | Attending: Emergency Medicine | Admitting: Emergency Medicine

## 2016-11-11 DIAGNOSIS — Z79899 Other long term (current) drug therapy: Secondary | ICD-10-CM | POA: Insufficient documentation

## 2016-11-11 DIAGNOSIS — F17210 Nicotine dependence, cigarettes, uncomplicated: Secondary | ICD-10-CM | POA: Insufficient documentation

## 2016-11-11 DIAGNOSIS — Z046 Encounter for general psychiatric examination, requested by authority: Secondary | ICD-10-CM | POA: Insufficient documentation

## 2016-11-11 DIAGNOSIS — T1491XA Suicide attempt, initial encounter: Secondary | ICD-10-CM | POA: Insufficient documentation

## 2016-11-11 DIAGNOSIS — T50902A Poisoning by unspecified drugs, medicaments and biological substances, intentional self-harm, initial encounter: Secondary | ICD-10-CM | POA: Insufficient documentation

## 2016-11-11 DIAGNOSIS — X838XXA Intentional self-harm by other specified means, initial encounter: Secondary | ICD-10-CM | POA: Insufficient documentation

## 2016-11-11 DIAGNOSIS — Z85028 Personal history of other malignant neoplasm of stomach: Secondary | ICD-10-CM | POA: Insufficient documentation

## 2016-11-11 DIAGNOSIS — Y929 Unspecified place or not applicable: Secondary | ICD-10-CM | POA: Insufficient documentation

## 2016-11-11 DIAGNOSIS — Y999 Unspecified external cause status: Secondary | ICD-10-CM | POA: Insufficient documentation

## 2016-11-11 DIAGNOSIS — Y9389 Activity, other specified: Secondary | ICD-10-CM | POA: Insufficient documentation

## 2016-11-11 LAB — COMPREHENSIVE METABOLIC PANEL
ALT: 15 U/L (ref 14–54)
ANION GAP: 14 (ref 5–15)
AST: 23 U/L (ref 15–41)
Albumin: 4.7 g/dL (ref 3.5–5.0)
Alkaline Phosphatase: 52 U/L (ref 38–126)
BILIRUBIN TOTAL: 1.3 mg/dL — AB (ref 0.3–1.2)
BUN: 16 mg/dL (ref 6–20)
CO2: 21 mmol/L — ABNORMAL LOW (ref 22–32)
Calcium: 9.1 mg/dL (ref 8.9–10.3)
Chloride: 102 mmol/L (ref 101–111)
Creatinine, Ser: 0.8 mg/dL (ref 0.44–1.00)
Glucose, Bld: 115 mg/dL — ABNORMAL HIGH (ref 65–99)
POTASSIUM: 3.2 mmol/L — AB (ref 3.5–5.1)
Sodium: 137 mmol/L (ref 135–145)
TOTAL PROTEIN: 7.7 g/dL (ref 6.5–8.1)

## 2016-11-11 LAB — RAPID URINE DRUG SCREEN, HOSP PERFORMED
Amphetamines: NOT DETECTED
BENZODIAZEPINES: POSITIVE — AB
Barbiturates: NOT DETECTED
COCAINE: POSITIVE — AB
Opiates: NOT DETECTED
Tetrahydrocannabinol: POSITIVE — AB

## 2016-11-11 LAB — CBG MONITORING, ED: GLUCOSE-CAPILLARY: 143 mg/dL — AB (ref 65–99)

## 2016-11-11 LAB — CBC
HCT: 44 % (ref 36.0–46.0)
Hemoglobin: 14.8 g/dL (ref 12.0–15.0)
MCH: 27.1 pg (ref 26.0–34.0)
MCHC: 33.6 g/dL (ref 30.0–36.0)
MCV: 80.6 fL (ref 78.0–100.0)
PLATELETS: 304 10*3/uL (ref 150–400)
RBC: 5.46 MIL/uL — ABNORMAL HIGH (ref 3.87–5.11)
RDW: 12.8 % (ref 11.5–15.5)
WBC: 6.7 10*3/uL (ref 4.0–10.5)

## 2016-11-11 LAB — PREGNANCY, URINE: PREG TEST UR: NEGATIVE

## 2016-11-11 LAB — ACETAMINOPHEN LEVEL

## 2016-11-11 LAB — ETHANOL

## 2016-11-11 LAB — SALICYLATE LEVEL: Salicylate Lvl: <7 mg/dL (ref 2.8–30.0)

## 2016-11-11 MED ORDER — ONDANSETRON HCL 4 MG PO TABS
4.0000 mg | ORAL_TABLET | Freq: Three times a day (TID) | ORAL | Status: DC | PRN
Start: 2016-11-11 — End: 2016-11-12

## 2016-11-11 MED ORDER — IBUPROFEN 400 MG PO TABS: 600.0000 mg | ORAL_TABLET | Freq: Four times a day (QID) | ORAL | Status: DC | PRN

## 2016-11-11 MED ORDER — SODIUM CHLORIDE 0.9 % IV BOLUS (SEPSIS)
1000.0000 mL | Freq: Once | INTRAVENOUS | Status: AC
  Administered 2016-11-11: 1000 mL via INTRAVENOUS

## 2016-11-11 MED ORDER — ACETAMINOPHEN 325 MG PO TABS
650.0000 mg | ORAL_TABLET | ORAL | Status: DC | PRN
  Administered 2016-11-12: 650 mg via ORAL
  Filled 2016-11-11: qty 2

## 2016-11-11 MED ORDER — ALUM & MAG HYDROXIDE-SIMETH 200-200-20 MG/5ML PO SUSP: 30.0000 mL | Freq: Four times a day (QID) | ORAL | Status: DC | PRN

## 2016-11-11 MED ORDER — FAMOTIDINE 20 MG PO TABS
20.0000 mg | ORAL_TABLET | Freq: Two times a day (BID) | ORAL | Status: DC
  Administered 2016-11-12 (×2): 20 mg via ORAL
  Filled 2016-11-11 (×2): qty 1

## 2016-11-11 NOTE — ED Triage Notes (Signed)
Pt reports that she is bipolar and is going through increase stress and took a handful of trazadone approx 2 hrs ago

## 2016-11-11 NOTE — ED Notes (Addendum)
Poison Control recommendations: -Minimum 6 hours cardiac monitoring -severe symptoms   -lethargy   -seizures   -coma   -bradycardia   -EKG changes (Torsades, 1 degree block,  RBBB, T wave inversion)  - mild to moderate symptoms   -drowsy   -ataxia   -nausea and vomiting   -tinnitus   -myoclonus  Hypotension-->replace fluids Seizure-->benzodiazapines and if this does not work use barbituates  QT > 500 at risk for Torsades--> replace magnesium and potassium to a high to normal level  -Perform tylenol level at four hours

## 2016-11-11 NOTE — ED Notes (Signed)
TTS complete 
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
## 2016-11-11 NOTE — ED Notes (Signed)
Spoke with poison control, gave update on patient's condition, poison control stated they are closing patient's case. EDP made aware.
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
## 2016-11-11 NOTE — ED Notes (Signed)
Poison Control notified

## 2016-11-11 NOTE — ED Notes (Signed)
Patient changed into burgundy scrubs, wanded by security. Clothes placed in secure locker.

## 2016-11-12 ENCOUNTER — Encounter: Payer: Self-pay | Admitting: Psychiatry

## 2016-11-12 ENCOUNTER — Inpatient Hospital Stay
Admission: AD | Admit: 2016-11-12 | Discharge: 2016-11-20 | DRG: 885 | Disposition: A | Discharge status: Home / Self Care | Payer: No Typology Code available for payment source | Source: Intra-hospital | Attending: Psychiatry | Admitting: Psychiatry

## 2016-11-12 DIAGNOSIS — Z818 Family history of other mental and behavioral disorders: Secondary | ICD-10-CM | POA: Diagnosis not present

## 2016-11-12 DIAGNOSIS — F17210 Nicotine dependence, cigarettes, uncomplicated: Secondary | ICD-10-CM | POA: Diagnosis present

## 2016-11-12 DIAGNOSIS — F3130 Bipolar disorder, current episode depressed, mild or moderate severity, unspecified: Secondary | ICD-10-CM | POA: Diagnosis present

## 2016-11-12 DIAGNOSIS — Z888 Allergy status to other drugs, medicaments and biological substances status: Secondary | ICD-10-CM

## 2016-11-12 DIAGNOSIS — Z809 Family history of malignant neoplasm, unspecified: Secondary | ICD-10-CM

## 2016-11-12 DIAGNOSIS — Z590 Homelessness: Secondary | ICD-10-CM | POA: Diagnosis not present

## 2016-11-12 DIAGNOSIS — F3163 Bipolar disorder, current episode mixed, severe, without psychotic features: Secondary | ICD-10-CM | POA: Diagnosis not present

## 2016-11-12 DIAGNOSIS — R45851 Suicidal ideations: Secondary | ICD-10-CM | POA: Diagnosis present

## 2016-11-12 DIAGNOSIS — F332 Major depressive disorder, recurrent severe without psychotic features: Secondary | ICD-10-CM | POA: Diagnosis present

## 2016-11-12 DIAGNOSIS — F4310 Post-traumatic stress disorder, unspecified: Secondary | ICD-10-CM | POA: Diagnosis present

## 2016-11-12 DIAGNOSIS — Z9114 Patient's other noncompliance with medication regimen: Secondary | ICD-10-CM

## 2016-11-12 DIAGNOSIS — G4700 Insomnia, unspecified: Secondary | ICD-10-CM | POA: Diagnosis present

## 2016-11-12 DIAGNOSIS — M1990 Unspecified osteoarthritis, unspecified site: Secondary | ICD-10-CM | POA: Diagnosis present

## 2016-11-12 DIAGNOSIS — F1210 Cannabis abuse, uncomplicated: Secondary | ICD-10-CM | POA: Diagnosis present

## 2016-11-12 DIAGNOSIS — F1410 Cocaine abuse, uncomplicated: Secondary | ICD-10-CM | POA: Diagnosis present

## 2016-11-12 DIAGNOSIS — Z915 Personal history of self-harm: Secondary | ICD-10-CM

## 2016-11-12 DIAGNOSIS — F17200 Nicotine dependence, unspecified, uncomplicated: Secondary | ICD-10-CM | POA: Insufficient documentation

## 2016-11-12 MED ORDER — TRAZODONE HCL 100 MG PO TABS
100.0000 mg | ORAL_TABLET | Freq: Every evening | ORAL | Status: DC | PRN
  Administered 2016-11-13 – 2016-11-14 (×2): 100 mg via ORAL
  Filled 2016-11-12 (×2): qty 1

## 2016-11-12 MED ORDER — NICOTINE 21 MG/24HR TD PT24
21.0000 mg | MEDICATED_PATCH | Freq: Every day | TRANSDERMAL | Status: DC
  Filled 2016-11-12 (×3): qty 1

## 2016-11-12 MED ORDER — MAGNESIUM HYDROXIDE 400 MG/5ML PO SUSP: 30.0000 mL | Freq: Every day | ORAL | Status: DC | PRN

## 2016-11-12 MED ORDER — ACETAMINOPHEN 325 MG PO TABS: 650.0000 mg | ORAL_TABLET | Freq: Four times a day (QID) | ORAL | Status: DC | PRN

## 2016-11-12 MED ORDER — HYDROXYZINE HCL 25 MG PO TABS
25.0000 mg | ORAL_TABLET | Freq: Three times a day (TID) | ORAL | Status: DC | PRN
Start: 2016-11-12 — End: 2016-11-15
  Administered 2016-11-13 – 2016-11-15 (×3): 25 mg via ORAL
  Filled 2016-11-12 (×3): qty 1

## 2016-11-12 MED ORDER — ALUM & MAG HYDROXIDE-SIMETH 200-200-20 MG/5ML PO SUSP
30.0000 mL | ORAL | Status: DC | PRN
  Administered 2016-11-17: 30 mL via ORAL
  Filled 2016-11-12: qty 30

## 2016-11-12 NOTE — ED Notes (Signed)
Pt transported by [facility] with paperwork and belongings.

## 2016-11-12 NOTE — Progress Notes (Signed)
CSW informed by Ian Malkin at [facility] that they will be accepting patient for admission to [facility].  Requesting that consents for treatment be faxed to [facility] from [facility] [facility] fax# [phone]).  Dr. Bary Leriche is the accepting provider.  Call report to [phone].  Patient is voluntary and may be transported by [employer].  Please schedule transport for patient to arrive after 7 PM.  [facility] ED Nurse, Apolonio Schneiders, notified.  Areatha Keas. Judi Cong, MSW, [place] Disposition Clinical Social Work [phone] (cell) [phone] (office)

## 2016-11-12 NOTE — BH Assessment (Signed)
Writer forwarded patient's information to [facility] BMU (Dr. Bary Leriche) and [facility] Charge Nurse [facility]), per instructions of [place] Director (Dr. Eduard Clos) during the "[facility] [place] Line Call."

## 2016-11-12 NOTE — ED Provider Notes (Signed)
Pt accepted by Dr. Dwyane Dee - no c/o at this time   Noemi Chapel, MD 11/12/16 2119

## 2016-11-12 NOTE — BH Assessment (Signed)
[facility] Assessment Progress Note  Pt reassessed. Pt continues to endorse SI. IP treatment still recommended.   Kenna Gilbert. Lovena Le, [place], [place], LPCA Counselor

## 2016-11-12 NOTE — BH Assessment (Signed)
Patient has been accepted to [facility].  Accepting physician is Dr. Eduard Clos.  Attending Physician will be Dr. Bary Leriche and she agreed to put in the admission orders.  Patient has been assigned to room 306-B, by [place].  Call report to [phone].  Representative/Transfer Coordinator is Dispensing optician Patient pre-admitted by [facility] Patient Access Kerry Dory)  [facility] Staff Romie Minus, TTS/Social Worker) made aware of acceptance.

## 2016-11-13 DIAGNOSIS — F3163 Bipolar disorder, current episode mixed, severe, without psychotic features: Principal | ICD-10-CM

## 2016-11-13 LAB — LIPID PANEL
CHOL/HDL RATIO: 4.2 ratio
CHOLESTEROL: 156 mg/dL (ref 0–200)
HDL: 37 mg/dL — ABNORMAL LOW (ref >40)
LDL Cholesterol: 104 mg/dL — ABNORMAL HIGH (ref 0–99)
TRIGLYCERIDES: 75 mg/dL (ref <150)
VLDL: 15 mg/dL (ref 0–40)

## 2016-11-13 LAB — TSH: TSH: 2.444 u[IU]/mL (ref 0.350–4.500)

## 2016-11-13 LAB — HEMOGLOBIN A1C
Hgb A1c MFr Bld: 5.2 % (ref 4.8–5.6)
Mean Plasma Glucose: 102.54 mg/dL

## 2016-11-13 MED ORDER — FLUVOXAMINE MALEATE 50 MG PO TABS
50.0000 mg | ORAL_TABLET | Freq: Every day | ORAL | Status: DC
  Administered 2016-11-13: 50 mg via ORAL
  Filled 2016-11-13: qty 1

## 2016-11-13 MED ORDER — OLANZAPINE 10 MG PO TABS
10.0000 mg | ORAL_TABLET | Freq: Every day | ORAL | Status: DC
  Administered 2016-11-13 – 2016-11-15 (×3): 10 mg via ORAL
  Filled 2016-11-13 (×3): qty 1

## 2016-11-13 NOTE — Progress Notes (Signed)
[facility] consult with patient. Patient was not available as patient was resting. Did not request a visit.   11/13/16 0900  Clinical Encounter Type  Visited With Patient;Health care provider  Visit Type Initial;Spiritual support  Referral From Nurse

## 2016-11-13 NOTE — Plan of Care (Signed)
Problem: Coping: Goal: Ability to verbalize frustrations and anger appropriately will improve Outcome: Progressing [name] verbalized her depressed and anxious feelings after lunch.  Problem: Self-Concept: Goal: Level of anxiety will decrease Outcome: Not Progressing Anxiety remains high.
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
## 2016-11-13 NOTE — BHH Suicide Risk Assessment (Signed)
[facility] Admission Suicide Risk Assessment   Nursing information obtained from:    Demographic factors:    Current Mental Status:    Loss Factors:    Historical Factors:    Risk Reduction Factors:     Total Time spent with patient: 1 hour Principal Problem: Bipolar disorder, curr episode mixed, severe, w/o psychotic features ([place]) Diagnosis:   Patient Active Problem List   Diagnosis Date Noted  . Tobacco use disorder [F17.200] 11/12/2016  . Major depressive disorder, recurrent severe without psychotic features ([place]) [F33.2] 11/12/2016  . MDD (major depressive disorder) [F32.9] 11/28/2015  . Bipolar disorder, curr episode mixed, severe, w/o psychotic features ([place]) [F31.63] 07/18/2015  . Cocaine use disorder, mild, abuse ([place]) [F14.10] 07/18/2015  . Opioid use disorder, mild, abuse ([place]) [F11.10] 07/18/2015  . History of ADHD [Z86.59] 07/18/2015  . BV (bacterial vaginosis) [N76.0, B96.89]   . Cannabis use disorder, mild, abuse [F12.10] 06/18/2014  . Seizure disorder ([place]) [D92.426] 06/18/2014  . PTSD (post-traumatic stress disorder) [F43.10] 06/16/2014  . Polysubstance (including opioids) dependence with physiol dependence ([place]) [F19.20] 02/13/2013   Subjective Data: suicide attempt.  Continued Clinical Symptoms:  Alcohol Use Disorder Identification Test Final Score (AUDIT): 0 The "Alcohol Use Disorders Identification Test", Guidelines for Use in Primary Care, Second Edition.  World Pharmacologist [facility]). Score between 0-7:  no or low risk or alcohol related problems. Score between 8-15:  moderate risk of alcohol related problems. Score between 16-19:  high risk of alcohol related problems. Score 20 or above:  warrants further diagnostic evaluation for alcohol dependence and treatment.   CLINICAL FACTORS:   Bipolar Disorder:   Depressive phase Depression:   Comorbid alcohol abuse/dependence Impulsivity Insomnia Alcohol/Substance  Abuse/Dependencies   Musculoskeletal: Strength & Muscle Tone: within normal limits Gait & Station: normal Patient leans: N/A  Psychiatric Specialty Exam: Physical Exam  Nursing note and vitals reviewed. Psychiatric: Her speech is normal. Her mood appears anxious. She is withdrawn. Thought content is paranoid. Cognition and memory are normal. She expresses impulsivity. She exhibits a depressed mood. She expresses suicidal ideation. She expresses suicidal plans.    Review of Systems  Neurological: Negative.   Psychiatric/Behavioral: Positive for depression, hallucinations, substance abuse and suicidal ideas. The patient is nervous/anxious and has insomnia.   All other systems reviewed and are negative.   Blood pressure 115/60, pulse 79, temperature 98 F (36.7 C), temperature source Oral, resp. rate 18, height 5\' 5"  (1.651 m), weight 81.6 kg (180 lb), last menstrual period 10/29/2016, SpO2 100 %.Body mass index is 29.95 kg/m.  General Appearance: Casual  Eye Contact:  Good  Speech:  Clear and Coherent  Volume:  Decreased  Mood:  Anxious and Depressed  Affect:  Tearful  Thought Process:  Goal Directed and Descriptions of Associations: Intact  Orientation:  Full (Time, Place, and Person)  Thought Content:  Hallucinations: Auditory  Suicidal Thoughts:  Yes.  with intent/plan  Homicidal Thoughts:  No  Memory:  Immediate;   Fair Recent;   Fair Remote;   Fair  Judgement:  Poor  Insight:  Lacking  Psychomotor Activity:  Psychomotor Retardation  Concentration:  Concentration: Fair and Attention Span: Fair  Recall:  [employer] of Knowledge:  Fair  Language:  Fair  Akathisia:  No  Handed:  Right  AIMS (if indicated):     Assets:  Communication Skills Desire for Improvement Physical Health Resilience Social Support  ADL's:  Intact  Cognition:  WNL  Sleep:  Number of Hours: 6  COGNITIVE FEATURES THAT CONTRIBUTE TO RISK:  None    SUICIDE RISK:   Severe:  Frequent,  intense, and enduring suicidal ideation, specific plan, no subjective intent, but some objective markers of intent (i.e., choice of lethal method), the method is accessible, some limited preparatory behavior, evidence of impaired self-control, severe dysphoria/symptomatology, multiple risk factors present, and few if any protective factors, particularly a lack of social support.  PLAN OF CARE: Hospital admission, medication management, substance abuse counseling, discharge planning.  Ms. Flett is a 36 year old female with a history of depression, anxiety, and mood instability admitted after suicide attempt by medication overdose in the context of severe social stressors.  1. Suicidal ideation. The patient is able to contract for safety in the hospital.  2. Mood. She was started on Luvox for depression and anxiety. We added Zyprexa for mood stabilization.  3. Substance abuse. The patient relapsed on cocaine. She is not interested in residential substance abuse treatment program. The patient has been abusing benzodiazepines. We will monitor for symptoms of withdrawal.  4. Metabolic syndrome monitoring. Lipid panel, TSH, hemoglobin A1c are pending.  5. EKG. Pending.  6. Pregnancy test is negative.  7. Insomnia. Trazodone is available.  8. Smoking. Nicotine patch is available.  9. Disposition. To be established. The patient is homeless and uninsured. She is a patient at [facility].  I certify that inpatient services furnished can reasonably be expected to improve the patient's condition.   Orson Slick, MD 11/13/2016, 2:43 PM

## 2016-11-13 NOTE — [facility] Group Notes (Signed)
[place] LCSW Group Therapy Note  Date/Time: 11/13/16, 0930  Type of Therapy/Topic:  Group Therapy:  Feelings about Diagnosis  Participation Level:  Did Not Attend   Mood:   Description of Group:    This group will allow patients to explore their thoughts and feelings about diagnoses they have received. Patients will be guided to explore their level of understanding and acceptance of these diagnoses. Facilitator will encourage patients to process their thoughts and feelings about the reactions of others to their diagnosis, and will guide patients in identifying ways to discuss their diagnosis with significant others in their lives. This group will be process-oriented, with patients participating in exploration of their own experiences as well as giving and receiving support and challenge from other group members.   Therapeutic Goals: 1. Patient will demonstrate understanding of diagnosis as evidence by identifying two or more symptoms of the disorder:  2. Patient will be able to express two feelings regarding the diagnosis 3. Patient will demonstrate ability to communicate their needs through discussion and/or role plays  Summary of Patient Progress:        Therapeutic Modalities:   Cognitive Behavioral Therapy Brief Therapy Feelings Identification   Lurline Idol, LCSW

## 2016-11-13 NOTE — Progress Notes (Signed)
Received [name] in her room asleep this am, she refused breakfast and stated she took a few bites of her lunch. She was noted in bed reading a book. She was tearful and stated she feels depressed and anxious. She continues to have passive SI without a plan and feels safe here in the hospital. She made several phone calls with increase anxiety and crying, afterwards she was given Vistaril.

## 2016-11-14 MED ORDER — FLUVOXAMINE MALEATE 50 MG PO TABS
100.0000 mg | ORAL_TABLET | Freq: Every day | ORAL | Status: DC
  Administered 2016-11-14 – 2016-11-19 (×6): 100 mg via ORAL
  Filled 2016-11-14 (×7): qty 2

## 2016-11-14 NOTE — Plan of Care (Signed)
Problem: Activity: Goal: Sleeping patterns will improve Outcome: Progressing Patient slept for Estimated Hours of 6; Precautionary checks every 15 minutes for safety maintained, room free of safety hazards, patient sustains no injury or falls during this shift.

## 2016-11-14 NOTE — Tx Team (Signed)
Interdisciplinary Treatment and Diagnostic Plan Update  11/14/2016 Time of Session: 10:30am [name] MRN: [id number]  Principal Diagnosis: Bipolar disorder, curr episode mixed, severe, w/o psychotic features ([place])  Secondary Diagnoses: Principal Problem:   Bipolar disorder, curr episode mixed, severe, w/o psychotic features ([name]) Active Problems:   PTSD (post-traumatic stress disorder)   Cannabis use disorder, mild, abuse   Cocaine use disorder, mild, abuse ([place])   Major depressive disorder, recurrent severe without psychotic features ([name])   Current Medications:  Current Facility-Administered Medications  Medication Dose Route Frequency Provider Last Rate Last Dose  . acetaminophen (TYLENOL) tablet 650 mg  650 mg Oral Q6H PRN Pucilowska, Jolanta B, MD      . alum & mag hydroxide-simeth (MAALOX/MYLANTA) 200-200-20 MG/5ML suspension 30 mL  30 mL Oral Q4H PRN Pucilowska, Jolanta B, MD      . fluvoxaMINE (LUVOX) tablet 100 mg  100 mg Oral QHS Pucilowska, Jolanta B, MD      . hydrOXYzine (ATARAX/VISTARIL) tablet 25 mg  25 mg Oral TID PRN Pucilowska, Jolanta B, MD   25 mg at 11/13/16 1840  . magnesium hydroxide (MILK OF MAGNESIA) suspension 30 mL  30 mL Oral Daily PRN Pucilowska, Jolanta B, MD      . nicotine (NICODERM CQ - dosed in mg/24 hours) patch 21 mg  21 mg Transdermal Q0600 Pucilowska, Jolanta B, MD      . OLANZapine (ZYPREXA) tablet 10 mg  10 mg Oral QHS Pucilowska, Jolanta B, MD   10 mg at 11/13/16 2106  . traZODone (DESYREL) tablet 100 mg  100 mg Oral QHS PRN Pucilowska, Jolanta B, MD   100 mg at 11/13/16 2107   PTA Medications: Prescriptions Prior to Admission  Medication Sig Dispense Refill Last Dose  . famotidine (PEPCID) 20 MG tablet Take 1 tablet (20 mg total) by mouth 2 (two) times daily. 30 tablet 0 unknown at unknown  . ibuprofen (ADVIL,MOTRIN) 800 MG tablet Take 1 tablet (800 mg total) by mouth every 8 (eight) hours as needed. 30 tablet 0 unknown at unknown   . traZODone (DESYREL) 50 MG tablet Take 1 tablet (50 mg total) by mouth at bedtime as needed for sleep. 30 tablet 0 11/11/2016 at Unknown time    Patient Stressors: Financial difficulties Loss of mother and father Marital or family conflict Medication change or noncompliance  Patient Strengths: Ability for insight Average or above average intelligence Motivation for treatment/growth Supportive family/friends Other: people person  Treatment Modalities: Medication Management, Group therapy, Case management,  1 to 1 session with clinician, Psychoeducation, Recreational therapy.   Physician Treatment Plan for Primary Diagnosis: Bipolar disorder, curr episode mixed, severe, w/o psychotic features ([place]) Long Term Goal(s): Improvement in symptoms so as ready for discharge Improvement in symptoms so as ready for discharge   Short Term Goals: Ability to identify changes in lifestyle to reduce recurrence of condition will improve Ability to verbalize feelings will improve Ability to disclose and discuss suicidal ideas Ability to demonstrate self-control will improve Ability to identify and develop effective coping behaviors will improve Ability to maintain clinical measurements within normal limits will improve Compliance with prescribed medications will improve Ability to identify triggers associated with substance abuse/mental health issues will improve Ability to identify changes in lifestyle to reduce recurrence of condition will improve Ability to demonstrate self-control will improve Ability to identify triggers associated with substance abuse/mental health issues will improve  Medication Management: Evaluate patient's response, side effects, and tolerance of medication regimen.  Therapeutic Interventions:  1 to 1 sessions, Unit Group sessions and Medication administration.  Evaluation of Outcomes: Progressing  Physician Treatment Plan for Secondary Diagnosis: Principal Problem:    Bipolar disorder, curr episode mixed, severe, w/o psychotic features ([name]) Active Problems:   PTSD (post-traumatic stress disorder)   Cannabis use disorder, mild, abuse   Cocaine use disorder, mild, abuse ([name])   Major depressive disorder, recurrent severe without psychotic features ([name])  Long Term Goal(s): Improvement in symptoms so as ready for discharge Improvement in symptoms so as ready for discharge   Short Term Goals: Ability to identify changes in lifestyle to reduce recurrence of condition will improve Ability to verbalize feelings will improve Ability to disclose and discuss suicidal ideas Ability to demonstrate self-control will improve Ability to identify and develop effective coping behaviors will improve Ability to maintain clinical measurements within normal limits will improve Compliance with prescribed medications will improve Ability to identify triggers associated with substance abuse/mental health issues will improve Ability to identify changes in lifestyle to reduce recurrence of condition will improve Ability to demonstrate self-control will improve Ability to identify triggers associated with substance abuse/mental health issues will improve     Medication Management: Evaluate patient's response, side effects, and tolerance of medication regimen.  Therapeutic Interventions: 1 to 1 sessions, Unit Group sessions and Medication administration.  Evaluation of Outcomes: Progressing   RN Treatment Plan for Primary Diagnosis: Bipolar disorder, curr episode mixed, severe, w/o psychotic features ([name]) Long Term Goal(s): Knowledge of disease and therapeutic regimen to maintain health will improve  Short Term Goals: Ability to remain free from injury will improve, Ability to disclose and discuss suicidal ideas, Ability to identify and develop effective coping behaviors will improve and Compliance with prescribed medications will improve  Medication Management: RN will  administer medications as ordered by provider, will assess and evaluate patient's response and provide education to patient for prescribed medication. RN will report any adverse and/or side effects to prescribing provider.  Therapeutic Interventions: 1 on 1 counseling sessions, Psychoeducation, Medication administration, Evaluate responses to treatment, Monitor vital signs and CBGs as ordered, Perform/monitor CIWA, COWS, AIMS and Fall Risk screenings as ordered, Perform wound care treatments as ordered.  Evaluation of Outcomes: Progressing   LCSW Treatment Plan for Primary Diagnosis: Bipolar disorder, curr episode mixed, severe, w/o psychotic features ([place]) Long Term Goal(s): Safe transition to appropriate next level of care at discharge, Engage patient in therapeutic group addressing interpersonal concerns.  Short Term Goals: Engage patient in aftercare planning with referrals and resources, Facilitate patient progression through stages of change regarding substance use diagnoses and concerns, Identify triggers associated with mental health/substance abuse issues and Increase skills for wellness and recovery  Therapeutic Interventions: Assess for all discharge needs, 1 to 1 time with Social worker, Explore available resources and support systems, Assess for adequacy in community support network, Educate family and significant other(s) on suicide prevention, Complete Psychosocial Assessment, Interpersonal group therapy.  Evaluation of Outcomes: Progressing   Progress in Treatment: Attending groups: No Participating in groups: No. Taking medication as prescribed: Yes. Toleration medication: Yes. Family/Significant other contact made: No, will contact:    Patient understands diagnosis: Yes. Discussing patient identified problems/goals with staff: Yes. Medical problems stabilized or resolved: Yes. Denies suicidal/homicidal ideation: No. Issues/concerns per patient self-inventory: No. Other:     New problem(s) identified: No, Describe:     New Short Term/Long Term Goal(s):  Discharge Plan or Barriers:   Reason for Continuation of Hospitalization: Depression Medication stabilization Suicidal ideation  Coordination  of Aftercare  Estimated Length of Stay: 3-5 days  Attendees: Patient:[facility] 11/14/2016 4:30 PM  Physician: Herma Ard Pucilowska 11/14/2016 4:30 PM  Nursing: Aris Lot RN 11/14/2016 4:30 PM  RN Care Manager: 11/14/2016 4:30 PM  Social Worker: Dossie Arbour, LCSW 11/14/2016 4:30 PM  Recreational Therapist:  11/14/2016 4:30 PM  Other:  11/14/2016 4:30 PM  Other:  11/14/2016 4:30 PM  Other: 11/14/2016 4:30 PM    Scribe for Treatment Team: August Saucer, LCSW 11/14/2016 4:30 PM
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
## 2016-11-14 NOTE — Plan of Care (Signed)
Problem: Coping: Goal: Ability to verbalize feelings will improve Outcome: Progressing Patient shows the ability to verbalize her feelings.  Problem: Safety: Goal: Ability to identify and utilize support systems that promote safety will improve Outcome: Not Progressing Patient does not engage in any activities, isolates to room and does not utilize her support team.  Problem: Self-Concept: Goal: Ability to identify factors that promote anxiety will improve Outcome: Not Progressing Patient does not have the ability to identify factors that will decrease her anxiety level. Goal: Level of anxiety will decrease Outcome: Not Progressing Patient continues to endorse increased levels of anxiety.

## 2016-11-14 NOTE — Progress Notes (Signed)
Patient remains isolative to room this shift. In bed with curtains drawn today. Patient encouraged to to get OOB and socialize with peers. Patient verbalized, "I'll try, I'm just so tired today." Attends meals but not present for group this morning. Denies pain and SI/AVH. Milieu remains safe, will continue to monitor and any acute changes will be reported to the MD.
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
## 2016-11-15 MED ORDER — PRAZOSIN HCL 2 MG PO CAPS
2.0000 mg | ORAL_CAPSULE | Freq: Once | ORAL | Status: AC
  Administered 2016-11-15: 2 mg via ORAL
  Filled 2016-11-15: qty 1

## 2016-11-15 MED ORDER — HYDROXYZINE HCL 50 MG PO TABS
50.0000 mg | ORAL_TABLET | Freq: Four times a day (QID) | ORAL | Status: DC | PRN
  Administered 2016-11-15 – 2016-11-20 (×11): 50 mg via ORAL
  Filled 2016-11-15 (×11): qty 1

## 2016-11-15 MED ORDER — TRAZODONE HCL 100 MG PO TABS
150.0000 mg | ORAL_TABLET | Freq: Every day | ORAL | Status: DC
  Administered 2016-11-15 – 2016-11-16 (×2): 150 mg via ORAL
  Filled 2016-11-15 (×3): qty 2
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
## 2016-11-15 NOTE — Progress Notes (Signed)
Patient came to staff for anxiety medicine after she talked to the shelter.Patient denies suicidal or homicidal ideations and AV hallucinations.Patient is pleasant and cooperative on approach.Visible in the milieu watching TV.Did not attend group.Appetite and energy level good.Support and encouragement given.

## 2016-11-15 NOTE — Plan of Care (Signed)
Problem: Coping: Goal: Ability to demonstrate self-control will improve Outcome: Progressing Patient is able to demonstrate self control with her anxiety.

## 2016-11-15 NOTE — Progress Notes (Signed)
Patient ID: [name], female   DOB: 04-14-1980, 36 y.o.   MRN: [id number] Improved mood and affect, jovial and smile, no distress or panic attacks, stated that everything is going well; out in the day room, denied SI/SIB/HI, complied with medications.

## 2016-11-15 NOTE — Plan of Care (Signed)
Problem: Activity: Goal: Sleeping patterns will improve Outcome: Progressing Patient slept for Estimated Hours of 6.45; Precautionary checks every 15 minutes for safety maintained, room free of safety hazards, patient sustains no injury or falls during this shift.    

## 2016-11-15 NOTE — BHH Group Notes (Signed)
[facility] Group Notes:  (Nursing/MHT/Case Management/Adjunct)  Date:  11/15/2016  Time:  9:43 PM  Type of Therapy:  Group Therapy  Participation Level:  Active  Participation Quality:  Appropriate  Affect:  Appropriate  Cognitive:  Appropriate  Insight:  Good  Engagement in Group:  Engaged  Modes of Intervention:  Support  Summary of Progress/Problems:  [name] 11/15/2016, 9:43 PM
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
## 2016-11-15 NOTE — BHH Group Notes (Signed)
[facility] LCSW Group Therapy Note  Date/Time: 11/15/16, 0930  Type of Therapy/Topic:  Group Therapy:  Balance in Life  Participation Level:  Did not attend  Description of Group:    This group will address the concept of balance and how it feels and looks when one is unbalanced. Patients will be encouraged to process areas in their lives that are out of balance, and identify reasons for remaining unbalanced. Facilitators will guide patients utilizing problem- solving interventions to address and correct the stressor making their life unbalanced. Understanding and applying boundaries will be explored and addressed for obtaining  and maintaining a balanced life. Patients will be encouraged to explore ways to assertively make their unbalanced needs known to significant others in their lives, using other group members and facilitator for support and feedback.  Therapeutic Goals: 1. Patient will identify two or more emotions or situations they have that consume much of in their lives. 2. Patient will identify signs/triggers that life has become out of balance:  3. Patient will identify two ways to set boundaries in order to achieve balance in their lives:  4. Patient will demonstrate ability to communicate their needs through discussion and/or role plays  Summary of Patient Progress:          Therapeutic Modalities:   Cognitive Behavioral Therapy Solution-Focused Therapy Assertiveness Training  Lurline Idol, LCSW

## 2016-11-16 MED ORDER — QUETIAPINE FUMARATE 100 MG PO TABS
100.0000 mg | ORAL_TABLET | Freq: Every day | ORAL | Status: DC
  Administered 2016-11-16 – 2016-11-18 (×3): 100 mg via ORAL
  Filled 2016-11-16 (×3): qty 1

## 2016-11-16 NOTE — BHH Group Notes (Signed)
[facility] Group Notes:  (Nursing/MHT/Case Management/Adjunct)  Date:  11/16/2016  Time:  9:28 PM  Type of Therapy:  Psychoeducational Skills  Participation Level:  Active  Participation Quality:  Appropriate  Affect:  Appropriate  Cognitive:  Appropriate  Insight:  Appropriate  Engagement in Group:  Engaged  Modes of Intervention:  Discussion, Socialization and Support  Summary of Progress/Problems:  [name] 11/16/2016, 9:28 PM
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
## 2016-11-16 NOTE — Plan of Care (Signed)
Problem: Education: Goal: Mental status will improve Outcome: Progressing Patient reports an improvement in her depressive symptoms; she denies any thoughts of self harm.  She continues to report anxiety, however, has not had a panic attack today.
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
## 2016-11-16 NOTE — Plan of Care (Signed)
Problem: Activity: Goal: Sleeping patterns will improve Outcome: Progressing Patient slept for Estimated Hours of 7.45; Precautionary checks every 15 minutes for safety maintained, room free of safety hazards, patient sustains no injury or falls during this shift.     

## 2016-11-16 NOTE — [facility] Suicide Risk Assessment (Signed)
[facility] INPATIENT:  Family/Significant Other Suicide Prevention Education  Suicide Prevention Education:  Patient Refusal for Family/Significant Other Suicide Prevention Education: The patient [name] has refused to provide written consent for family/significant other to be provided Family/Significant Other Suicide Prevention Education during admission and/or prior to discharge.  Physician notified.  Carloyn Jaeger Ledora Delker 11/16/2016, 8:57 AM

## 2016-11-16 NOTE — [facility] Group Notes (Signed)
LCSW Group Therapy Note  11/16/2016 9:30am  Type of Therapy and Topic:  Group Therapy:  Feelings around Relapse and Recovery  Participation Level:  Did Not Attend   Description of Group:    Patients in this group will discuss emotions they experience before and after a relapse. They will process how experiencing these feelings, or avoidance of experiencing them, relates to having a relapse. Facilitator will guide patients to explore emotions they have related to recovery. Patients will be encouraged to process which emotions are more powerful. They will be guided to discuss the emotional reaction significant others in their lives may have to their relapse or recovery. Patients will be assisted in exploring ways to respond to the emotions of others without this contributing to a relapse.  Therapeutic Goals: 1. Patient will identify two or more emotions that lead to a relapse for them 2. Patient will identify two emotions that result when they relapse 3. Patient will identify two emotions related to recovery 4. Patient will demonstrate ability to communicate their needs through discussion and/or role plays   Summary of Patient Progress:     Therapeutic Modalities:   Cognitive Behavioral Therapy Solution-Focused Therapy Assertiveness Training Relapse Prevention Therapy   August Saucer, LCSW 11/16/2016 11:19 AM

## 2016-11-17 LAB — HIV ANTIBODY (ROUTINE TESTING W REFLEX): HIV Screen 4th Generation wRfx: NONREACTIVE

## 2016-11-17 MED ORDER — TRAZODONE HCL 50 MG PO TABS: 250.0000 mg | ORAL_TABLET | Freq: Every day | ORAL | Status: DC

## 2016-11-17 MED ORDER — TRAZODONE HCL 100 MG PO TABS
200.0000 mg | ORAL_TABLET | Freq: Every day | ORAL | Status: DC
  Administered 2016-11-17: 200 mg via ORAL
  Filled 2016-11-17: qty 2

## 2016-11-17 NOTE — Progress Notes (Signed)
Pt is alert and oriented x 4.  Pt has c/o of depression 7/10, pt states she is ok, she can deal with it. Pt also states she is a little anxious about discharging. Denies SI, HI or A/V hallucinations. Pt is in a depressed mood but pleasant. Contracted to safety. Interacting appropriately with staff and peers. Pt is cooperative and med compliant. Denies any pain or discomforted. No s/s of distress. 15 min safety checks continues.

## 2016-11-17 NOTE — Plan of Care (Signed)
Problem: Coping: Goal: Ability to verbalize feelings will improve Outcome: Progressing Pt will verbalize improvement of depression this shift.
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
## 2016-11-17 NOTE — Progress Notes (Addendum)
[name] remains anxious, she was visible in the milieu but she also voiced her anxiety on shift. She was given Vistaril PRN with some effect. She remains sad and depressed, she interacts with peers and staff. She was medication compliant. She appears to be in bed resting quietly at this time. Nicotine patch given to nurse before bed because patient reports increased dreams.

## 2016-11-17 NOTE — Progress Notes (Signed)
Patient is alert and oriented. She is pleasant. She slept most of the day. When finally speaking with her she stated the medication had made her sleepy. She ate meals and went back to bed. After supper she stayed up. She denies all but was over heard speaking with another peer telling him she is bipolar and her moods go up and down. She requests vistaril for anxiety but no outward signs are noted. She smiles and makes good eye contact with Probation officer.

## 2016-11-17 NOTE — Plan of Care (Signed)
Problem: Activity: Goal: Sleeping patterns will improve Outcome: Progressing Patient states she slept well  Problem: Coping: Goal: Ability to verbalize frustrations and anger appropriately will improve Outcome: Progressing Patient encouraged to attend groups

## 2016-11-17 NOTE — B[facility] Group Notes (Signed)
[place] Group Notes:  (Nursing/MHT/Case Management/Adjunct)  Date:  11/17/2016  Time:  9:45 PM  Type of Therapy:  Group Therapy  Participation Level:  Active  Participation Quality:  Appropriate  Affect:  Appropriate  Cognitive:  Appropriate  Insight:  Good  Engagement in Group:  Engaged  Modes of Intervention:  Support  Summary of Progress/Problems:  [name] 11/17/2016, 9:45 PM

## 2016-11-18 LAB — RPR: RPR: NONREACTIVE

## 2016-11-18 MED ORDER — FLUVOXAMINE MALEATE 100 MG PO TABS: 100.0000 mg | ORAL_TABLET | Freq: Every day | ORAL | 1 refills | Status: DC

## 2016-11-18 MED ORDER — TRAZODONE HCL 100 MG PO TABS: 200.0000 mg | ORAL_TABLET | Freq: Every day | ORAL | 1 refills | Status: DC

## 2016-11-18 MED ORDER — QUETIAPINE FUMARATE 100 MG PO TABS: 100.0000 mg | ORAL_TABLET | Freq: Every day | ORAL | 1 refills | Status: DC

## 2016-11-18 MED ORDER — HYDROXYZINE HCL 50 MG PO TABS: 50.0000 mg | ORAL_TABLET | Freq: Four times a day (QID) | ORAL | 1 refills | Status: DC | PRN

## 2016-11-18 MED ORDER — TRAZODONE HCL 100 MG PO TABS
200.0000 mg | ORAL_TABLET | Freq: Every day | ORAL | Status: DC
  Administered 2016-11-18: 200 mg via ORAL
  Filled 2016-11-18: qty 2

## 2016-11-18 NOTE — Progress Notes (Signed)
Pt is very anxious. She states she has so much going on in her personal life, wouldn't go into details. Denies SI, HI or A/V hallucinations. Depression is 7/10, pt says she is dealing with depression at this time. Pt is very assertive, she interacts with staff and peers appropriately. Pt was playing cards with peers. Pt did attend wrap up group. Contracted to safety. Pt is med compliant, no adverse affects noted. No s/s of distress, no c/o pain or discomfort. 15 min safety checks continues.

## 2016-11-18 NOTE — BHH Group Notes (Signed)
LCSW Group Therapy Note   11/18/2016 1:15pm   Type of Therapy and Topic:  Group Therapy:  Positive Affirmations   Participation Level:  Active  Description of Group: This group addressed positive affirmation toward self and others. Patients went around the room and identified two positive things about themselves and two positive things about a peer in the room. Patients reflected on how it felt to share something positive with others, to identify positive things about themselves, and to hear positive things from others. Patients were encouraged to have a daily reflection of positive characteristics or circumstances.  Therapeutic Goals 1. Patient will verbalize two of their positive qualities 2. Patient will demonstrate empathy for others by stating two positive qualities about a peer in the group 3. Patient will verbalize their feelings when voicing positive self affirmations and when voicing positive affirmations of others 4. Patients will discuss the potential positive impact on their wellness/recovery of focusing on positive traits of self and others. Summary of Patient Progress:  Stayed the entire time, engaged throughout.  Talked about her mother's suicide and her father's death from cancer the same year.  "I was trying not to grieve so I could focus and take care of my father in his last days."  Cited her faith and kids as the things that have kept her going.  Therapeutic Modalities Cognitive Behavioral Therapy Motivational [place], [place] 11/18/2016 2:35 PM

## 2016-11-18 NOTE — Plan of Care (Signed)
Problem: Self-Concept: Goal: Level of anxiety will decrease Outcome: Progressing Pt anxiety level will decrease on this shift.

## 2016-11-18 NOTE — B[facility] Group Notes (Signed)
[place] Group Notes:  (Nursing/MHT/Case Management/Adjunct)  Date:  11/18/2016  Time:  11:18 PM  Type of Therapy:  Psychoeducational Skills  Participation Level:  Active  Participation Quality:  Appropriate, Attentive and Sharing  Affect:  Appropriate  Cognitive:  Appropriate  Insight:  Appropriate and Good  Engagement in Group:  Engaged  Modes of Intervention:  Discussion, Socialization and Support  Summary of Progress/Problems:  [name] 11/18/2016, 11:18 PM

## 2016-11-18 NOTE — Plan of Care (Signed)
Problem: Education: Goal: Mental status will improve Outcome: Not Progressing [name] continues to endorsed feeling depressed and anxious.  Problem: Coping: Goal: Ability to verbalize frustrations and anger appropriately will improve Outcome: Progressing [name] is verbalizing her feeling in an appropriate manner.

## 2016-11-18 NOTE — Progress Notes (Signed)
Received [name] this am after breakfast, she was compliant with her medications. She endorsed feeling depressed and anxious this pm and was given a Vistaril 50 mg tab. She is OOB in the milieu with her peers and attending the group therapy sessions.
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
## 2016-11-19 LAB — CHLAMYDIA/NGC RT PCR (ARMC ONLY)
CHLAMYDIA TR: NOT DETECTED
N GONORRHOEAE: NOT DETECTED

## 2016-11-19 MED ORDER — QUETIAPINE FUMARATE 200 MG PO TABS: 200.0000 mg | ORAL_TABLET | Freq: Every day | ORAL | 1 refills | Status: DC

## 2016-11-19 MED ORDER — QUETIAPINE FUMARATE 200 MG PO TABS
200.0000 mg | ORAL_TABLET | Freq: Every day | ORAL | Status: DC
  Administered 2016-11-19: 200 mg via ORAL
  Filled 2016-11-19: qty 1

## 2016-11-19 MED ORDER — HYDROXYZINE HCL 50 MG PO TABS: 50.0000 mg | ORAL_TABLET | Freq: Four times a day (QID) | ORAL | 1 refills | Status: DC | PRN

## 2016-11-19 MED ORDER — FLUVOXAMINE MALEATE 100 MG PO TABS: 100.0000 mg | ORAL_TABLET | Freq: Every day | ORAL | 1 refills | Status: DC

## 2016-11-19 NOTE — Progress Notes (Signed)
Patient is upset and anxious because she does not have a place to go.Patient verbalized that she will be hopeful.Compliant with medications.Attended groups.Appropriate with staff & peers.Support and encouragement given.

## 2016-11-19 NOTE — Tx Team (Signed)
Interdisciplinary Treatment and Diagnostic Plan Update  11/19/2016 Time of Session: 1105am [name] MRN: [id number]  Principal Diagnosis: Bipolar disorder, curr episode mixed, severe, w/o psychotic features ([place])  Secondary Diagnoses: Principal Problem:   Bipolar disorder, curr episode mixed, severe, w/o psychotic features ([place]) Active Problems:   PTSD (post-traumatic stress disorder)   Cannabis use disorder, mild, abuse   Cocaine use disorder, mild, abuse ([place])   Major depressive disorder, recurrent severe without psychotic features ([place])   Current Medications:  Current Facility-Administered Medications  Medication Dose Route Frequency Provider Last Rate Last Dose  . acetaminophen (TYLENOL) tablet 650 mg  650 mg Oral Q6H PRN Pucilowska, Jolanta B, MD      . alum & mag hydroxide-simeth (MAALOX/MYLANTA) 200-200-20 MG/5ML suspension 30 mL  30 mL Oral Q4H PRN Pucilowska, Jolanta B, MD   30 mL at 11/17/16 2120  . fluvoxaMINE (LUVOX) tablet 100 mg  100 mg Oral QHS Pucilowska, Jolanta B, MD   100 mg at 11/18/16 2102  . hydrOXYzine (ATARAX/VISTARIL) tablet 50 mg  50 mg Oral Q6H PRN Pucilowska, Jolanta B, MD   50 mg at 11/19/16 1021  . magnesium hydroxide (MILK OF MAGNESIA) suspension 30 mL  30 mL Oral Daily PRN Pucilowska, Jolanta B, MD      . nicotine (NICODERM CQ - dosed in mg/24 hours) patch 21 mg  21 mg Transdermal Q0600 Pucilowska, Jolanta B, MD      . QUEtiapine (SEROQUEL) tablet 200 mg  200 mg Oral QHS Pucilowska, Jolanta B, MD       PTA Medications: Prescriptions Prior to Admission  Medication Sig Dispense Refill Last Dose  . famotidine (PEPCID) 20 MG tablet Take 1 tablet (20 mg total) by mouth 2 (two) times daily. 30 tablet 0 unknown at unknown  . ibuprofen (ADVIL,MOTRIN) 800 MG tablet Take 1 tablet (800 mg total) by mouth every 8 (eight) hours as needed. 30 tablet 0 unknown at unknown  . traZODone (DESYREL) 50 MG tablet Take 1 tablet (50 mg total) by mouth at bedtime as  needed for sleep. 30 tablet 0 11/11/2016 at Unknown time    Patient Stressors: Financial difficulties Loss of mother and father Marital or family conflict Medication change or noncompliance  Patient Strengths: Ability for insight Average or above average intelligence Motivation for treatment/growth Supportive family/friends Other: people person  Treatment Modalities: Medication Management, Group therapy, Case management,  1 to 1 session with clinician, Psychoeducation, Recreational therapy.   Physician Treatment Plan for Primary Diagnosis: Bipolar disorder, curr episode mixed, severe, w/o psychotic features ([place]) Long Term Goal(s): Improvement in symptoms so as ready for discharge Improvement in symptoms so as ready for discharge   Short Term Goals: Ability to identify changes in lifestyle to reduce recurrence of condition will improve Ability to verbalize feelings will improve Ability to disclose and discuss suicidal ideas Ability to demonstrate self-control will improve Ability to identify and develop effective coping behaviors will improve Ability to maintain clinical measurements within normal limits will improve Compliance with prescribed medications will improve Ability to identify triggers associated with substance abuse/mental health issues will improve Ability to identify changes in lifestyle to reduce recurrence of condition will improve Ability to demonstrate self-control will improve Ability to identify triggers associated with substance abuse/mental health issues will improve  Medication Management: Evaluate patient's response, side effects, and tolerance of medication regimen.  Therapeutic Interventions: 1 to 1 sessions, Unit Group sessions and Medication administration.  Evaluation of Outcomes: Progressing  Physician Treatment Plan for Secondary  Diagnosis: Principal Problem:   Bipolar disorder, curr episode mixed, severe, w/o psychotic features ([place]) Active  Problems:   PTSD (post-traumatic stress disorder)   Cannabis use disorder, mild, abuse   Cocaine use disorder, mild, abuse ([facility])   Major depressive disorder, recurrent severe without psychotic features ([place])  Long Term Goal(s): Improvement in symptoms so as ready for discharge Improvement in symptoms so as ready for discharge   Short Term Goals: Ability to identify changes in lifestyle to reduce recurrence of condition will improve Ability to verbalize feelings will improve Ability to disclose and discuss suicidal ideas Ability to demonstrate self-control will improve Ability to identify and develop effective coping behaviors will improve Ability to maintain clinical measurements within normal limits will improve Compliance with prescribed medications will improve Ability to identify triggers associated with substance abuse/mental health issues will improve Ability to identify changes in lifestyle to reduce recurrence of condition will improve Ability to demonstrate self-control will improve Ability to identify triggers associated with substance abuse/mental health issues will improve     Medication Management: Evaluate patient's response, side effects, and tolerance of medication regimen.  Therapeutic Interventions: 1 to 1 sessions, Unit Group sessions and Medication administration.  Evaluation of Outcomes: Progressing   RN Treatment Plan for Primary Diagnosis: Bipolar disorder, curr episode mixed, severe, w/o psychotic features ([place]) Long Term Goal(s): Knowledge of disease and therapeutic regimen to maintain health will improve  Short Term Goals: Ability to remain free from injury will improve, Ability to disclose and discuss suicidal ideas, Ability to identify and develop effective coping behaviors will improve and Compliance with prescribed medications will improve  Medication Management: RN will administer medications as ordered by provider, will assess and evaluate patient's  response and provide education to patient for prescribed medication. RN will report any adverse and/or side effects to prescribing provider.  Therapeutic Interventions: 1 on 1 counseling sessions, Psychoeducation, Medication administration, Evaluate responses to treatment, Monitor vital signs and CBGs as ordered, Perform/monitor CIWA, COWS, AIMS and Fall Risk screenings as ordered, Perform wound care treatments as ordered.  Evaluation of Outcomes: Progressing   LCSW Treatment Plan for Primary Diagnosis: Bipolar disorder, curr episode mixed, severe, w/o psychotic features ([place]) Long Term Goal(s): Safe transition to appropriate next level of care at discharge, Engage patient in therapeutic group addressing interpersonal concerns.  Short Term Goals: Engage patient in aftercare planning with referrals and resources, Facilitate patient progression through stages of change regarding substance use diagnoses and concerns, Identify triggers associated with mental health/substance abuse issues and Increase skills for wellness and recovery  Therapeutic Interventions: Assess for all discharge needs, 1 to 1 time with Social worker, Explore available resources and support systems, Assess for adequacy in community support network, Educate family and significant other(s) on suicide prevention, Complete Psychosocial Assessment, Interpersonal group therapy.  Evaluation of Outcomes: Progressing   Progress in Treatment: Attending groups: Yes Participating in groups: Yes Taking medication as prescribed: Yes. Toleration medication: Yes. Family/Significant other contact made: No, will contact:   pt refused consent. Patient understands diagnosis: Yes. Discussing patient identified problems/goals with staff: Yes. Medical problems stabilized or resolved: Yes. Denies suicidal/homicidal ideation: No. Issues/concerns per patient self-inventory: No. Other:    New problem(s) identified: No, Describe:     New Short  Term/Long Term Goal(s):  Discharge Plan or Barriers:   Reason for Continuation of Hospitalization:  Coordination of Aftercare  Estimated Length of Stay: 1 day   Attendees: Patient: 11/19/2016   Physician: Dr. Bary Leriche, MD 11/19/2016   Nursing:  Tyler Pita, RN 11/19/2016   RN Care Manager: 11/19/2016   Social Worker: Lurline Idol, LCSW 11/19/2016   Recreational Therapist:  11/19/2016   Other:  11/19/2016   Other:  11/19/2016   Other: 11/19/2016         Scribe for Treatment Team: Joanne Chars, [place] 11/19/2016 11:47 AM
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
## 2016-11-19 NOTE — BHH Group Notes (Signed)
[facility] Group Notes:  (Nursing/MHT/Case Management/Adjunct)  Date:  11/19/2016  Time:  11:10 PM  Type of Therapy:  Group Therapy  Participation Level:  Active  Participation Quality:  Appropriate  Affect:  Appropriate  Cognitive:  Appropriate  Insight:  Good  Engagement in Group:  Engaged  Modes of Intervention:  Support  Summary of Progress/Problems:  [name] 11/19/2016, 11:10 PM

## 2016-11-19 NOTE — BHH Suicide Risk Assessment (Signed)
[facility] Discharge Suicide Risk Assessment   Principal Problem: Bipolar disorder, curr episode mixed, severe, w/o psychotic features [facility]) Discharge Diagnoses:  Patient Active Problem List   Diagnosis Date Noted  . Tobacco use disorder [F17.200] 11/12/2016  . Major depressive disorder, recurrent severe without psychotic features ([place]) [F33.2] 11/12/2016  . MDD (major depressive disorder) [F32.9] 11/28/2015  . Bipolar disorder, curr episode mixed, severe, w/o psychotic features ([place]) [F31.63] 07/18/2015  . Cocaine use disorder, mild, abuse ([place]) [F14.10] 07/18/2015  . Opioid use disorder, mild, abuse ([place]) [F11.10] 07/18/2015  . History of ADHD [Z86.59] 07/18/2015  . BV (bacterial vaginosis) [N76.0, B96.89]   . Cannabis use disorder, mild, abuse [F12.10] 06/18/2014  . Seizure disorder ([place]) [M22.633] 06/18/2014  . PTSD (post-traumatic stress disorder) [F43.10] 06/16/2014  . Polysubstance (including opioids) dependence with physiol dependence ([place]) [F19.20] 02/13/2013    Total Time spent with patient: 30 minutes  Musculoskeletal: Strength & Muscle Tone: within normal limits Gait & Station: normal Patient leans: N/A  Psychiatric Specialty Exam: Review of Systems  Psychiatric/Behavioral: Positive for substance abuse.  All other systems reviewed and are negative.   Blood pressure 109/71, pulse 71, temperature 98.4 F (36.9 C), temperature source Oral, resp. rate 18, height 5\' 5"  (1.651 m), weight 81.6 kg (180 lb), last menstrual period 10/29/2016, SpO2 99 %.Body mass index is 29.95 kg/m.  General Appearance: Casual  Eye Contact::  Good  Speech:  Clear and Coherent409  Volume:  Normal  Mood:  Euthymic  Affect:  Appropriate  Thought Process:  Goal Directed and Descriptions of Associations: Intact  Orientation:  Full (Time, Place, and Person)  Thought Content:  WDL  Suicidal Thoughts:  No  Homicidal Thoughts:  No  Memory:  Immediate;   Fair Recent;   Fair Remote;   Fair   Judgement:  Impaired  Insight:  Shallow  Psychomotor Activity:  Normal  Concentration:  Fair  Recall:  [place]  Language: Fair  Akathisia:  No  Handed:  Right  AIMS (if indicated):     Assets:  Communication Skills Desire for Improvement Financial Resources/Insurance Housing Physical Health Resilience Social Support  Sleep:  Number of Hours: 6.3  Cognition: WNL  ADL's:  Intact   Mental Status Per Nursing Assessment::   On Admission:     Demographic Factors:  Caucasian, Low socioeconomic status and Unemployed  Loss Factors: Financial problems/change in socioeconomic status  Historical Factors: Prior suicide attempts, Family history of mental illness or substance abuse and Impulsivity  Risk Reduction Factors:   Responsible for children under 59 years of age and Sense of responsibility to family  Continued Clinical Symptoms:  Depression:   Comorbid alcohol abuse/dependence Impulsivity Alcohol/Substance Abuse/Dependencies  Cognitive Features That Contribute To Risk:  None    Suicide Risk:  Minimal: No identifiable suicidal ideation.  Patients presenting with no risk factors but with morbid ruminations; may be classified as minimal risk based on the severity of the depressive symptoms    Plan Of Care/Follow-up recommendations:  Activity:  as tolerated. Diet:  low sodium eart healthy. Other:  keep follow up appointments.  Orson Slick, MD 11/19/2016, 9:08 AM

## 2016-11-19 NOTE — Progress Notes (Signed)
CSW spoke with Linnell Fulling of [facility].  She has been unable to locate any shelter beds for pt.  She is able to work with her if she ends up at [facility].  She is still trying.  CSW met with pt, who called her ex husband regarding staying with him temporarily--he said no.  He contacted [facility] [employer] in [place] ([place])  [phone] or [place] cell [phone].  CSW called and left message. Winferd Humphrey, MSW, LCSW Clinical Social Worker 11/19/2016 2:57 PM

## 2016-11-19 NOTE — Plan of Care (Signed)
Problem: Coping: Goal: Ability to verbalize frustrations and anger appropriately will improve Outcome: Progressing Patient denies suicidal or homicidal ideations.

## 2016-11-19 NOTE — BHH Group Notes (Signed)
LCSW Group Therapy Note   11/19/2016 9:30am   Type of Therapy and Topic:  Group Therapy:  Overcoming Obstacles   Participation Level:  Minimal   Description of Group:    In this group patients will be encouraged to explore what they see as obstacles to their own wellness and recovery. They will be guided to discuss their thoughts, feelings, and behaviors related to these obstacles. The group will process together ways to cope with barriers, with attention given to specific choices patients can make. Each patient will be challenged to identify changes they are motivated to make in order to overcome their obstacles. This group will be process-oriented, with patients participating in exploration of their own experiences as well as giving and receiving support and challenge from other group members.   Therapeutic Goals: 1. Patient will identify personal and current obstacles as they relate to admission. 2. Patient will identify barriers that currently interfere with their wellness or overcoming obstacles.  3. Patient will identify feelings, thought process and behaviors related to these barriers. 4. Patient will identify two changes they are willing to make to overcome these obstacles:      Summary of Patient Progress   Pt able to meet therapeutic goals listed above.  Shares she is not sleeping well. Verbalizes not really having a plan for discharge and shares that she believes her main obstacle is her divorce that has required her to be out of the house.  Loose access to home and money.   Therapeutic Modalities:   Cognitive Behavioral Therapy Solution Focused Therapy Motivational Interviewing Relapse Prevention Therapy  August Saucer, LCSW 11/19/2016 5:20 PM

## 2016-11-19 NOTE — [facility] Group Notes (Signed)
[facility] Group Notes:  (Nursing/MHT/Case Management/Adjunct)  Date:  11/19/2016  Time:  2:28 PM  Type of Therapy:  Psychoeducational Skills  Participation Level:  Active  Participation Quality:  Appropriate  Affect:  Appropriate  Cognitive:  Appropriate  Insight:  Appropriate  Engagement in Group:  Engaged  Modes of Intervention:  Discussion, Education, Socialization and Support  Summary of Progress/Problems:  [name] 11/19/2016, 2:28 PM

## 2016-11-20 NOTE — BHH Group Notes (Signed)
LCSW Group Therapy Note  11/20/2016 9:30am  Type of Therapy/Topic:  Group Therapy:  Feelings about Diagnosis  Participation Level:  Did Not Attend   Description of Group:   This group will allow patients to explore their thoughts and feelings about diagnoses they have received. Patients will be guided to explore their level of understanding and acceptance of these diagnoses. Facilitator will encourage patients to process their thoughts and feelings about the reactions of others to their diagnosis and will guide patients in identifying ways to discuss their diagnosis with significant others in their lives. This group will be process-oriented, with patients participating in exploration of their own experiences, giving and receiving support, and processing challenge from other group members.   Therapeutic Goals: 1. Patient will demonstrate understanding of diagnosis as evidenced by identifying two or more symptoms of the disorder 2. Patient will be able to express two feelings regarding the diagnosis 3. Patient will demonstrate their ability to communicate their needs through discussion and/or role play  Summary of Patient Progress:       Therapeutic Modalities:   Cognitive Behavioral Therapy Brief Therapy Feelings Identification    August Saucer, LCSW 11/20/2016 10:35 AM

## 2016-11-20 NOTE — Progress Notes (Signed)
Patient ID: [name], female   DOB: 06-16-1980, 36 y.o.   MRN: [id number] Visible in the day room, pleasant on approach, happy mood and affect, stated that "I have bo where to go; not too good; they are trying to find a place for me ....., did not sleep well because the doctor discontinued the Trazodone, the Seroquel is not working...." Denied pain, mood and affect appropriate.

## 2016-11-20 NOTE — Progress Notes (Signed)
D: Patient is aware of  Discharge this shift .Patient denies suicidal /homicidal ideations. Patient received all belongings brought in  A: No Storage medications. Writer reviewed Discharge Summary, Suicide Risk Assessment, and Transitional Record. Patient also received Prescriptions   from  MD. A 7 day supply  Aware  Of follow up appointment . R: Patient left unit with no questions  Or concerns   Riding  Bus  To destination

## 2016-11-20 NOTE — Plan of Care (Signed)
Problem: Activity: Goal: Sleeping patterns will improve Outcome: Progressing Patient slept for Estimated Hours of 6.45; Precautionary checks every 15 minutes for safety maintained, room free of safety hazards, patient sustains no injury or falls during this shift.    

## 2016-11-20 NOTE — Progress Notes (Signed)
  [facility] Adult Case Management Discharge Plan :  Will you be returning to the same living situation after discharge:  No. Going to [facility]. At discharge, do you have transportation home?: No. Bus pass provided. Do you have the ability to pay for your medications: No.Medication management clinic referral made.  Release of information consent forms completed and in the chart;  Patient's signature needed at discharge.  Patient to Follow up at: Follow-up Information    [facility] on 11/22/2016.   Why:  Please attend your hospital discharge appointment on Thursday, 11/22/16, at 12:30pm.  Please bring a copy of your hospital discharge paperwork. Contact information: [place] [address] [phone]           Next level of care provider has access to [place] and Suicide Prevention discussed: No. Pt refused request.  Have you used any form of tobacco in the last 30 days? (Cigarettes, Smokeless Tobacco, Cigars, and/or Pipes): Yes  Has patient been referred to the Quitline?: Patient refused referral  Patient has been referred for addiction treatment: Yes  Joanne Chars, [place] 11/20/2016, 10:51 AM

## 2016-11-20 NOTE — BHH Suicide Risk Assessment (Signed)
[facility] Discharge Suicide Risk Assessment   Principal Problem: Bipolar disorder, curr episode mixed, severe, w/o psychotic features [facility]) Discharge Diagnoses:  Patient Active Problem List   Diagnosis Date Noted  . Tobacco use disorder [F17.200] 11/12/2016  . Major depressive disorder, recurrent severe without psychotic features ([place]) [F33.2] 11/12/2016  . MDD (major depressive disorder) [F32.9] 11/28/2015  . Bipolar disorder, curr episode mixed, severe, w/o psychotic features ([place]) [F31.63] 07/18/2015  . Cocaine use disorder, mild, abuse ([place]) [F14.10] 07/18/2015  . Opioid use disorder, mild, abuse ([place]) [F11.10] 07/18/2015  . History of ADHD [Z86.59] 07/18/2015  . BV (bacterial vaginosis) [N76.0, B96.89]   . Cannabis use disorder, mild, abuse [F12.10] 06/18/2014  . Seizure disorder ([place]) [Z60.109] 06/18/2014  . PTSD (post-traumatic stress disorder) [F43.10] 06/16/2014  . Polysubstance (including opioids) dependence with physiol dependence ([place]) [F19.20] 02/13/2013    Total Time spent with patient: 30 minutes  Musculoskeletal: Strength & Muscle Tone: within normal limits Gait & Station: normal Patient leans: N/A  Psychiatric Specialty Exam: Review of Systems  Psychiatric/Behavioral: Positive for substance abuse.  All other systems reviewed and are negative.   Blood pressure 113/73, pulse 79, temperature 98.2 F (36.8 C), temperature source Oral, resp. rate 18, height 5\' 5"  (1.651 m), weight 81.6 kg (180 lb), last menstrual period 10/29/2016, SpO2 98 %.Body mass index is 29.95 kg/m.  General Appearance: Casual  Eye Contact::  Good  Speech:  Clear and Coherent409  Volume:  Normal  Mood:  Euthymic  Affect:  Appropriate  Thought Process:  Goal Directed and Descriptions of Associations: Intact  Orientation:  Full (Time, Place, and Person)  Thought Content:  WDL  Suicidal Thoughts:  No  Homicidal Thoughts:  No  Memory:  Immediate;   Fair Recent;   Fair Remote;   Fair   Judgement:  Impaired  Insight:  Shallow  Psychomotor Activity:  Normal  Concentration:  Fair  Recall:  [place]  Language: Fair  Akathisia:  No  Handed:  Right  AIMS (if indicated):     Assets:  Communication Skills Desire for Improvement Financial Resources/Insurance Housing Physical Health Resilience Social Support  Sleep:  Number of Hours: 6.45  Cognition: WNL  ADL's:  Intact   Mental Status Per Nursing Assessment::   On Admission:     Demographic Factors:  Caucasian, Low socioeconomic status and Unemployed  Loss Factors: Financial problems/change in socioeconomic status  Historical Factors: Prior suicide attempts, Family history of mental illness or substance abuse and Impulsivity  Risk Reduction Factors:   Responsible for children under 43 years of age and Sense of responsibility to family  Continued Clinical Symptoms:  Depression:   Comorbid alcohol abuse/dependence Impulsivity Alcohol/Substance Abuse/Dependencies  Cognitive Features That Contribute To Risk:  None    Suicide Risk:  Minimal: No identifiable suicidal ideation.  Patients presenting with no risk factors but with morbid ruminations; may be classified as minimal risk based on the severity of the depressive symptoms    Plan Of Care/Follow-up recommendations:  Activity:  as tolerated. Diet:  low sodium eart healthy. Other:  keep follow up appointments.  Orson Slick, MD 11/20/2016, 8:53 AM

## 2016-11-22 LAB — CHLAMYDIA PANEL SERUM
C. Pneumoniae IgM Serum: <1:10 {titer}
C. Psittaci IgM Serum: <1:10 {titer}
C. Trachomatis IgM Serum: <1:10 {titer}

## 2019-04-21 ENCOUNTER — Encounter (INDEPENDENT_AMBULATORY_CARE_PROVIDER_SITE_OTHER): Payer: Self-pay | Admitting: Gastroenterology

## 2019-06-28 ENCOUNTER — Encounter (HOSPITAL_COMMUNITY): Payer: Self-pay | Admitting: Emergency Medicine

## 2019-06-28 ENCOUNTER — Emergency Department (HOSPITAL_COMMUNITY)
Admission: EM | Admit: 2019-06-28 | Discharge: 2019-06-29 | Disposition: A | Discharge status: Home / Self Care | Payer: Medicaid Other | Attending: Emergency Medicine | Admitting: Emergency Medicine

## 2019-06-28 ENCOUNTER — Emergency Department (HOSPITAL_COMMUNITY): Payer: Medicaid Other

## 2019-06-28 ENCOUNTER — Other Ambulatory Visit: Payer: Self-pay

## 2019-06-28 DIAGNOSIS — R0789 Other chest pain: Secondary | ICD-10-CM | POA: Diagnosis not present

## 2019-06-28 DIAGNOSIS — K920 Hematemesis: Secondary | ICD-10-CM | POA: Diagnosis not present

## 2019-06-28 DIAGNOSIS — R1084 Generalized abdominal pain: Secondary | ICD-10-CM | POA: Diagnosis not present

## 2019-06-28 DIAGNOSIS — F17210 Nicotine dependence, cigarettes, uncomplicated: Secondary | ICD-10-CM | POA: Diagnosis not present

## 2019-06-28 DIAGNOSIS — R195 Other fecal abnormalities: Secondary | ICD-10-CM | POA: Diagnosis not present

## 2019-06-28 DIAGNOSIS — L989 Disorder of the skin and subcutaneous tissue, unspecified: Secondary | ICD-10-CM | POA: Insufficient documentation

## 2019-06-28 LAB — CBC
HCT: 46 % (ref 36.0–46.0)
Hemoglobin: 14.4 g/dL (ref 12.0–15.0)
MCH: 26.2 pg (ref 26.0–34.0)
MCHC: 31.3 g/dL (ref 30.0–36.0)
MCV: 83.8 fL (ref 80.0–100.0)
Platelets: 399 10*3/uL (ref 150–400)
RBC: 5.49 MIL/uL — ABNORMAL HIGH (ref 3.87–5.11)
RDW: 12.9 % (ref 11.5–15.5)
WBC: 5.5 10*3/uL (ref 4.0–10.5)
nRBC: 0 % (ref 0.0–0.2)

## 2019-06-28 LAB — BASIC METABOLIC PANEL
Anion gap: 9 (ref 5–15)
BUN: 9 mg/dL (ref 6–20)
CO2: 28 mmol/L (ref 22–32)
Calcium: 9.9 mg/dL (ref 8.9–10.3)
Chloride: 101 mmol/L (ref 98–111)
Creatinine, Ser: 0.92 mg/dL (ref 0.44–1.00)
GFR calc Af Amer: >60 mL/min (ref >60)
GFR calc non Af Amer: >60 mL/min (ref >60)
Glucose, Bld: 96 mg/dL (ref 70–99)
Potassium: 4.6 mmol/L (ref 3.5–5.1)
Sodium: 138 mmol/L (ref 135–145)

## 2019-06-28 LAB — TROPONIN I (HIGH SENSITIVITY): Troponin I (High Sensitivity): <2 ng/L (ref <18)

## 2019-06-28 LAB — I-STAT BETA HCG BLOOD, ED (MC, WL, AP ONLY): I-stat hCG, quantitative: <5 m[IU]/mL (ref <5)

## 2019-06-28 LAB — PROTIME-INR
INR: 0.9 (ref 0.8–1.2)
Prothrombin Time: 11.9 seconds (ref 11.4–15.2)

## 2019-06-28 MED ORDER — SODIUM CHLORIDE 0.9% FLUSH: 3.0000 mL | Freq: Once | INTRAVENOUS | Status: DC

## 2019-06-28 NOTE — ED Triage Notes (Signed)
Patient reports intermittent central chest tightness/pressure with mild SOB this afternoon  , no emesis or diaphoresis , patient added itchy/burning skin lesions at abdomen for several weeks diagnosed by her PCP as MRSA infection unrelieved by prescription oral antibiotics. No fever or chills .

## 2019-06-28 NOTE — ED Provider Notes (Signed)
[facility] EMERGENCY DEPARTMENT Provider Note   CSN: [id number] Arrival date & time: 06/28/19  2042     History Chief Complaint  Patient presents with  . Chest Pain    [name] is a 39 y.o. female.  Patient with history of bipolar, ADHD, anxiety, PTSD presents to the emergency department with a chief complaint of parasites.  She states that she has having parasites crawling out of her skin.  She reports that she has been having this issue since November.  She states that she is now having bloody stool and bloody vomit.  She states that she is scheduled to see a para cytologist in July.  She has brought specimen samples to the emergency department.  She has been treated for MRSA with antibiotics in the past.  She states that she came in today because of new abdominal pain and new blood vomit and stools.  The history is provided by the patient. No language interpreter was used.       Past Medical History:  Diagnosis Date  . ADD (attention deficit disorder)   . ADHD (attention deficit hyperactivity disorder)   . Anxiety   . Arthritis   . Bipolar 1 disorder ([place])   . Bipolar depression ([place])   . Cancer ([place])    stomach   . Degenerative disc disease   . Depression   . PTSD (post-traumatic stress disorder)     Patient Active Problem List   Diagnosis Date Noted  . Tobacco use disorder 11/12/2016  . Major depressive disorder, recurrent severe without psychotic features ([place]) 11/12/2016  . MDD (major depressive disorder) 11/28/2015  . Bipolar disorder, curr episode mixed, severe, w/o psychotic features ([place]) 07/18/2015  . Cocaine use disorder, mild, abuse ([place]) 07/18/2015  . Opioid use disorder, mild, abuse ([place]) 07/18/2015  . History of ADHD 07/18/2015  . BV (bacterial vaginosis)   . Cannabis use disorder, mild, abuse 06/18/2014  . Seizure disorder ([place]) 06/18/2014  . PTSD (post-traumatic stress disorder) 06/16/2014  . Polysubstance (including  opioids) dependence with physiol dependence ([place]) 02/13/2013    History reviewed. No pertinent surgical history.   OB History    Gravida  4   Para  2   Term  2   Preterm      AB  2   Living        SAB  2   TAB      Ectopic      Multiple      Live Births              Family History  Problem Relation Age of Onset  . Cancer Father   . Cancer Mother   . Mental illness Mother     Social History   Tobacco Use  . Smoking status: Current Every Day Smoker    Packs/day: 0.25    Years: 17.00    Pack years: 4.25    Types: Cigarettes  . Smokeless tobacco: Never Used  . Tobacco comment: last use 1.5 years ago.  Substance Use Topics  . Alcohol use: Yes    Comment: occasionally  . Drug use: Yes    Types: Marijuana, Cocaine    Comment: + Coc; + THC    Home Medications Prior to Admission medications   Medication Sig Start Date End Date Taking? Authorizing Provider  fluvoxaMINE (LUVOX) 100 MG tablet Take 1 tablet (100 mg total) by mouth at bedtime. 11/19/16   Clovis Fredrickson, MD  hydrOXYzine (ATARAX/VISTARIL) 50 MG tablet Take 1 tablet (50 mg total) by mouth every 6 (six) hours as needed for anxiety. 11/19/16   Pucilowska, Herma Ard B, MD  QUEtiapine (SEROQUEL) 200 MG tablet Take 1 tablet (200 mg total) by mouth at bedtime. 11/19/16   Pucilowska, Wardell Honour, MD    Allergies    Geodon [ziprasidone hcl] and Prednisone  Review of Systems   Review of Systems  All other systems reviewed and are negative.   Physical Exam Updated Vital Signs BP (!) 151/101 (BP Location: Right Arm)   Pulse (!) 113   Temp 98.2 F (36.8 C) (Oral)   Resp (!) 23   Ht 5\' 5"  (1.651 m)   Wt 81.6 kg   LMP 06/19/2019   SpO2 100%   BMI 29.95 kg/m   Physical Exam Vitals and nursing note reviewed.  Constitutional:      General: She is not in acute distress.    Appearance: She is well-developed.  HENT:     Head: Normocephalic and atraumatic.  Eyes:     Conjunctiva/sclera:  Conjunctivae normal.  Cardiovascular:     Rate and Rhythm: Regular rhythm. Tachycardia present.     Heart sounds: No murmur.  Pulmonary:     Effort: Pulmonary effort is normal. No respiratory distress.     Breath sounds: Normal breath sounds.  Abdominal:     Palpations: Abdomen is soft.     Tenderness: There is no abdominal tenderness.  Musculoskeletal:     Cervical back: Neck supple.  Skin:    General: Skin is warm and dry.     Comments: Scattered lesions/excoriasions on abdominal wall and anterior neck  Neurological:     Mental Status: She is alert and oriented to person, place, and time.  Psychiatric:        Mood and Affect: Mood normal.        Behavior: Behavior normal.     ED Results / Procedures / Treatments   Labs (all labs ordered are listed, but only abnormal results are displayed) Labs Reviewed  CBC - Abnormal; Notable for the following components:      Result Value   RBC 5.49 (*)    All other components within normal limits  GASTROINTESTINAL PANEL BY PCR, STOOL (REPLACES STOOL CULTURE)  BASIC METABOLIC PANEL  PROTIME-INR  I-STAT BETA HCG BLOOD, ED ([facility], [facility], [facility] ONLY)  TROPONIN I (HIGH SENSITIVITY)  TROPONIN I (HIGH SENSITIVITY)    EKG None  Radiology DG Chest 2 View  Result Date: 06/28/2019 CLINICAL DATA:  Chest pain EXAM: CHEST - 2 VIEW COMPARISON:  November 26, 2009 FINDINGS: The heart size and mediastinal contours are within normal limits. Both lungs are clear. The visualized skeletal structures are unremarkable. IMPRESSION: No active cardiopulmonary disease. Electronically Signed   By: Dorise Bullion III M.D   On: 06/28/2019 21:14    Procedures Procedures (including critical care time)  Medications Ordered in ED Medications  sodium chloride flush (NS) 0.9 % injection 3 mL (0 mLs Intravenous Hold 06/28/19 2237)    ED Course  I have reviewed the triage vital signs and the nursing notes.  Pertinent labs & imaging results that were available during  my care of the patient were reviewed by me and considered in my medical decision making (see chart for details).    MDM Rules/Calculators/A&P                      This patient complains of skin lesions,  parasites in stool and crawling out of her skin, and abdominal pain with blood in stools, this involves an extensive number of treatment options, and is a complaint that carries with it a high risk of complications and morbidity.  The differential diagnosis includes GI bleed, colitis, cellulitis, psychiatric illness.  Pertinent Labs I ordered, reviewed, and interpreted labs, which included CBC notable for no leukocytosis, hemoglobin is normal, BMP is normal, i-STAT hCG is negative, GI pathogen panel is pending.  Imaging Interpretation I ordered imaging studies which included CT of the abdomen to rule out evidence of colitis or diverticulitis.  The CT showed moderate stool burden, will treat patient for constipation.  Medications I ordered medication Tylenol for pain.   Reassessments After the interventions stated above, I reevaluated the patient and found stable for discharge.  Patient brought several different specimen cups full of unknown material.  Given the excoriations, and patient's persistent belief of being infested with parasites and presenting with specimen cups, delusional parasitosis remains on the differential.  No concerning emergent findings were discovered tonight.  I feel that the patient has been adequately screened for emergent condition and can be discharged home.  Final Clinical Impression(s) / ED Diagnoses Final diagnoses:  Skin lesions  Generalized abdominal pain    Rx / DC Orders ED Discharge Orders         Ordered    docusate sodium (COLACE) 100 MG capsule     06/29/19 0140           Montine Circle, PA-C 06/29/19 0147    Fatima Blank, MD 06/29/19 [phone]

## 2019-06-29 ENCOUNTER — Encounter (HOSPITAL_COMMUNITY): Payer: Self-pay | Admitting: Radiology

## 2019-06-29 ENCOUNTER — Emergency Department (HOSPITAL_COMMUNITY): Payer: Medicaid Other

## 2019-06-29 LAB — TROPONIN I (HIGH SENSITIVITY): Troponin I (High Sensitivity): <2 ng/L (ref <18)

## 2019-06-29 MED ORDER — DOCUSATE SODIUM 100 MG PO CAPS: ORAL_CAPSULE | ORAL | 0 refills | Status: DC

## 2019-06-29 MED ORDER — IOHEXOL 300 MG/ML  SOLN
100.0000 mL | Freq: Once | INTRAMUSCULAR | Status: AC | PRN
  Administered 2019-06-29: 100 mL via INTRAVENOUS

## 2019-06-29 NOTE — Discharge Instructions (Signed)
No clear explanation for your symptoms was found.  Your CT scan revealed a moderate to large stool burden, which likely indicates constipation.  The stool sample you gave is in process.  If we detect any concerning bacteria, we will call you.

## 2019-06-30 LAB — GASTROINTESTINAL PANEL BY PCR, STOOL (REPLACES STOOL CULTURE)

## 2019-07-27 ENCOUNTER — Encounter (INDEPENDENT_AMBULATORY_CARE_PROVIDER_SITE_OTHER): Payer: Self-pay | Admitting: Gastroenterology

## 2019-07-27 ENCOUNTER — Encounter (INDEPENDENT_AMBULATORY_CARE_PROVIDER_SITE_OTHER): Payer: Self-pay | Admitting: *Deleted

## 2019-07-27 ENCOUNTER — Other Ambulatory Visit: Payer: Self-pay

## 2019-07-27 ENCOUNTER — Other Ambulatory Visit (INDEPENDENT_AMBULATORY_CARE_PROVIDER_SITE_OTHER): Payer: Self-pay | Admitting: *Deleted

## 2019-07-27 ENCOUNTER — Ambulatory Visit (INDEPENDENT_AMBULATORY_CARE_PROVIDER_SITE_OTHER): Payer: Medicaid Other | Admitting: Gastroenterology

## 2019-07-27 VITALS — BP 156/89 | HR 128 | Temp 97.9°F | Ht 64.0 in | Wt 186.9 lb

## 2019-07-27 DIAGNOSIS — K625 Hemorrhage of anus and rectum: Secondary | ICD-10-CM

## 2019-07-27 DIAGNOSIS — R110 Nausea: Secondary | ICD-10-CM

## 2019-07-27 DIAGNOSIS — R634 Abnormal weight loss: Secondary | ICD-10-CM | POA: Diagnosis not present

## 2019-07-27 DIAGNOSIS — R197 Diarrhea, unspecified: Secondary | ICD-10-CM

## 2019-07-27 MED ORDER — HYOSCYAMINE SULFATE 0.125 MG SL SUBL: 0.1250 mg | SUBLINGUAL_TABLET | Freq: Four times a day (QID) | SUBLINGUAL | 1 refills | Status: DC | PRN

## 2019-07-27 NOTE — Progress Notes (Signed)
Patient profile: Emalene Keitha Kolk is a 39 y.o. female seen for evaluation of abd pain, blood in stool. Referred by Dr. Sherrie Sport  History of Present Illness: [name] is seen today and reports 9 months ago developed large episode of rectal bleeding, she went to ER at [facility], reports symptoms were diagnosed as hemorrhoids.  States immediately after that ER visit began having diarrhea, reports this as severe. Reports diarrhea as loose liquid stools - states she is rarely having formed stools. Usually having 4-5 loose BM a day, usually these are mixed w/ blood. These frequently have mucus and jelly like particles mixed in with BRB as well. Reports significant abd cramping throughout the day, abd pain doesn't usually improve w/ defecation. Usually located more in upper abd. Also has early satiety since the symptoms began 9 months ago. Weight is down from #200 when symptoms started to #186 today. Drinking 2 ensures a day to prevent further weight loss. Reports no appetite.   Reports nausea 1-2x a day, worse w/ motion in car, eating can make nausea better or worse. Had 3 episodes of vomiting,once w/ blood-once was seen in ER after hematemesis. No GERD symptoms. She does have globus sensation on right throat.   Also having significant skin lesions over past 6 months. She has tried creams and antibiotics (3-4 courses) without improvement.   Denies family history of crohn's or ulcerative. Smokes 1 cig. No alcohol. Marijuana 1x/ for appetite.       Prior GI notes reviewed from 2017-had abd pain/diarrhea, rectal bleeding.   Wt Readings from Last 3 Encounters:  07/27/19 186 lb 14.4 oz (84.8 kg)  06/28/19 180 lb (81.6 kg)  10/25/16 170 lb (77.1 kg)     Path from EGD/Colonoscopy 2017: 1. DUODENUM, BULB, SECOND PART, MUCOSAL BIOPSY:  -DUODENAL MUCOSA WITH INTACT VILLOUS ARCHITECTURE.  -NO SIGNIFICANT INTRAEPITHELIAL LYMPHOCYTOSIS IDENTIFIED.   2. GASTRIC ANTRUM, BODY, MUCOSAL  BIOPSY:  -CHEMICAL GASTRITIS.  -NO MORPHOLOGIC EVIDENCE OF H.PYLORI SEEN.   3. COLON, RANDOM, MUCOSAL BIOPSY:  -MULTIPLE FRAGMENTS OF COLONIC MUCOSA, WITHIN NORMAL LIMITS.  -NO EVIDENCE OF MICROSCOPIC COLITIS SEEN.   4. COLON,SIGMOID, POLYPECTOMY:  -HYPERPLASTIC POLYP.     Past Medical History:  Past Medical History:  Diagnosis Date  . ADD (attention deficit disorder)   . ADHD (attention deficit hyperactivity disorder)   . Anxiety   . Arthritis   . Bipolar 1 disorder ([place])   . Bipolar depression ([place])   . Cancer ([place])    stomach   . Degenerative disc disease   . Depression   . PTSD (post-traumatic stress disorder)     Problem List: Patient Active Problem List   Diagnosis Date Noted  . Tobacco use disorder 11/12/2016  . Major depressive disorder, recurrent severe without psychotic features ([place]) 11/12/2016  . MDD (major depressive disorder) 11/28/2015  . Bipolar disorder, curr episode mixed, severe, w/o psychotic features ([place]) 07/18/2015  . Cocaine use disorder, mild, abuse ([place]) 07/18/2015  . Opioid use disorder, mild, abuse ([place]) 07/18/2015  . History of ADHD 07/18/2015  . BV (bacterial vaginosis)   . Cannabis use disorder, mild, abuse 06/18/2014  . Seizure disorder ([place]) 06/18/2014  . PTSD (post-traumatic stress disorder) 06/16/2014  . Polysubstance (including opioids) dependence with physiol dependence ([place]) 02/13/2013    Past Surgical History: History reviewed. No pertinent surgical history.  Allergies: Allergies  Allergen Reactions  . Geodon [Ziprasidone Hcl] Other (See Comments)    Severe shaking and jerking  . Prednisone Hives    "  can not take steroids"      Home Medications:  Current Outpatient Medications:  Marland Kitchen  Multiple Vitamins-Minerals (MULTIVITAMIN ADULT) CHEW, Chew 2 each by mouth daily., Disp: , Rfl:  .  sertraline (ZOLOFT) 50 MG tablet, Take 50 mg by mouth daily., Disp: , Rfl:  .  traZODone (DESYREL) 100 MG tablet, Take 100 mg by  mouth at bedtime as needed for sleep., Disp: , Rfl:  .  docusate sodium (COLACE) 100 MG capsule, Take 2 tablets daily until you have regular bowel movements then 1 tablet daily until stools are soft. (Patient not taking: Reported on 07/27/2019), Disp: 30 capsule, Rfl: 0 .  fluvoxaMINE (LUVOX) 100 MG tablet, Take 1 tablet (100 mg total) by mouth at bedtime. (Patient not taking: Reported on 06/29/2019), Disp: 30 tablet, Rfl: 1 .  hydrOXYzine (ATARAX/VISTARIL) 50 MG tablet, Take 1 tablet (50 mg total) by mouth every 6 (six) hours as needed for anxiety. (Patient not taking: Reported on 06/29/2019), Disp: 90 tablet, Rfl: 1 .  hyoscyamine (LEVSIN SL) 0.125 MG SL tablet, Place 1 tablet (0.125 mg total) under the tongue every 6 (six) hours as needed (diarrhea, abd pain)., Disp: 30 tablet, Rfl: 1 .  QUEtiapine (SEROQUEL) 200 MG tablet, Take 1 tablet (200 mg total) by mouth at bedtime. (Patient not taking: Reported on 06/29/2019), Disp: 30 tablet, Rfl: 1   Family History: family history includes Cancer in her father and mother; Mental illness in her mother.   Mother-breast cancer, age 47, deceased  Father - lung cancer, passed 9  Social History:   reports that she has been smoking cigarettes. She has a 4.25 pack-year smoking history. She has never used smokeless tobacco. She reports current alcohol use. She reports current drug use. Drugs: Marijuana and Cocaine.   Review of Systems: Constitutional: Denies weight loss/weight gain  Eyes: No changes in vision. ENT: No oral lesions, sore throat.  GI: see HPI.  Heme/Lymph: No easy bruising.  CV: No chest pain.  GU: No hematuria.  Integumentary: +rashes.  Neuro: No headaches.  Psych: No depression/anxiety.  Endocrine: No heat/cold intolerance.  Allergic/Immunologic: No urticaria.  Resp: No cough, SOB.  Musculoskeletal: No joint swelling.    Physical Examination: BP (!) 156/89 (BP Location: Right Arm, Patient Position: Sitting, Cuff Size: Large)   Pulse  (!) 128   Temp 97.9 F (36.6 C) (Oral)   Ht 5\' 4"  (1.626 m)   Wt 186 lb 14.4 oz (84.8 kg)   BMI 32.08 kg/m  Gen: NAD, alert and oriented x 4 HEENT: PEERLA, EOMI, Neck: supple, no JVD Chest: CTA bilaterally, no wheezes, crackles, or other adventitious sounds CV: RRR, no m/g/c/r Abd: soft, NT, ND, +BS in all four quadrants; no HSM, guarding, ridigity, or rebound tenderness Ext: no edema, well perfused with 2+ pulses, Skin: multiple erythematous skin lesions w/ crusting. Lymph: no noted LAD  Data Reviewed:  06/29/19--stool GI PCR panel normal. INR normal, CBC normal, BMP normal  05/05/19-O&P stool normal & stool pathogen normal.    CT a/p 06/2019-IMPRESSION: 1. No acute intra-abdominal abnormality to explain the patient's symptoms. 2. Moderate to large colonic stool burden. Correlate for constipation/slowed intestinal transit. 3. Small fat containing umbilical hernia. No bowel containing hernias. 4. Reported skin lesions are not well visualized on this CT examination. No cutaneous abscess or collection is seen  Assessment/Plan: Ms. Leisey is a 39 y.o. female    [place] was seen today for new patient (initial visit).  Diagnoses and all orders for this visit:  Rectal bleeding -     C. difficile GDH and Toxin A/B -     Helicobacter pylori special antigen  Diarrhea, unspecified type -     C. difficile GDH and Toxin A/B -     Helicobacter pylori special antigen  Loss of weight -     Helicobacter pylori special antigen  Nausea without vomiting -     Helicobacter pylori special antigen  Other orders -     hyoscyamine (LEVSIN SL) 0.125 MG SL tablet; Place 1 tablet (0.125 mg total) under the tongue every 6 (six) hours as needed (diarrhea, abd pain).     1. Rectal bleeding/diarrhea/wt loss - evaluation w/ stool GI PCR and O&P negative. Needs C diff testing and colonoscopy/colonoscopy for evaluation initially. Differential includes IBD, IBS, celiac. WIll given levsin to use  PRN for symptoms in interim. Would also consider further work up w/ thyroid studies, inflammatory markers, fecal elastase, etc if colonoscopy unremarkable. Given upper GI sx of nausea will also add on h pylori testing and EGD.  She has endoscopy colonoscopy 2070 but states that symptoms currently are very different than the symptoms she had at that point.  CT has been unremarkable except constipation.   I personally performed the service, non-incident to. (WP)  Laurine Blazer, [facility]

## 2019-07-27 NOTE — Patient Instructions (Signed)
We are checking stool studies & scheduling colonoscopy for eval

## 2019-07-31 ENCOUNTER — Other Ambulatory Visit (INDEPENDENT_AMBULATORY_CARE_PROVIDER_SITE_OTHER): Payer: Self-pay | Admitting: *Deleted

## 2019-07-31 ENCOUNTER — Encounter (HOSPITAL_COMMUNITY): Payer: Self-pay

## 2019-07-31 DIAGNOSIS — R197 Diarrhea, unspecified: Secondary | ICD-10-CM

## 2019-07-31 DIAGNOSIS — R109 Unspecified abdominal pain: Secondary | ICD-10-CM

## 2019-07-31 DIAGNOSIS — R634 Abnormal weight loss: Secondary | ICD-10-CM

## 2019-07-31 DIAGNOSIS — R110 Nausea: Secondary | ICD-10-CM

## 2019-07-31 NOTE — Patient Instructions (Signed)
Your procedure is scheduled on: 08/05/2019  Report to [facility] at     12:30 PM.  Call this number if you have problems the morning of surgery: [phone]   Remember:              Follow Directions on the letter you received from Your Physician's office regarding the Bowel Prep              No Smoking the day of Procedure :   Take these medicines the morning of surgery with A SIP OF WATER:zoloft    Do not wear jewelry, make-up or nail polish.    Do not bring valuables to the hospital.  Contacts, dentures or bridgework may not be worn into surgery.  .   Patients discharged the day of surgery will not be allowed to drive home.     Colonoscopy, Adult, Care After This sheet gives you information about how to care for yourself after your procedure. Your health care provider may also give you more specific instructions. If you have problems or questions, contact your health care provider. What can I expect after the procedure? After the procedure, it is common to have:  A small amount of blood in your stool for 24 hours after the procedure.  Some gas.  Mild abdominal cramping or bloating.  Follow these instructions at home: General instructions   For the first 24 hours after the procedure: ? Do not drive or use machinery. ? Do not sign important documents. ? Do not drink alcohol. ? Do your regular daily activities at a slower pace than normal. ? Eat soft, easy-to-digest foods. ? Rest often.  Take over-the-counter or prescription medicines only as told by your health care provider.  It is up to you to get the results of your procedure. Ask your health care provider, or the department performing the procedure, when your results will be ready. Relieving cramping and bloating  Try walking around when you have cramps or feel bloated.  Apply heat to your abdomen as told by your health care provider. Use a heat source that your health care provider recommends, such as a  moist heat pack or a heating pad. ? Place a towel between your skin and the heat source. ? Leave the heat on for 20-30 minutes. ? Remove the heat if your skin turns bright red. This is especially important if you are unable to feel pain, heat, or cold. You may have a greater risk of getting burned. Eating and drinking  Drink enough fluid to keep your urine clear or pale yellow.  Resume your normal diet as instructed by your health care provider. Avoid heavy or fried foods that are hard to digest.  Avoid drinking alcohol for as long as instructed by your health care provider. Contact a health care provider if:  You have blood in your stool 2-3 days after the procedure. Get help right away if:  You have more than a small spotting of blood in your stool.  You pass large blood clots in your stool.  Your abdomen is swollen.  You have nausea or vomiting.  You have a fever.  You have increasing abdominal pain that is not relieved with medicine. This information is not intended to replace advice given to you by your health care provider. Make sure you discuss any questions you have with your health care provider. Document Released: 09/06/2003 Document Revised: 10/17/2015 Document Reviewed: 04/05/2015 Elsevier Interactive Patient Education  [employer].  Upper Endoscopy, Adult, Care After This sheet gives you information about how to care for yourself after your procedure. Your health care provider may also give you more specific instructions. If you have problems or questions, contact your health care provider. What can I expect after the procedure? After the procedure, it is common to have:  A sore throat.  Mild stomach pain or discomfort.  Bloating.  Nausea. Follow these instructions at home:   Follow instructions from your health care provider about what to eat or drink after your procedure.  Return to your normal activities as told by your health care provider. Ask  your health care provider what activities are safe for you.  Take over-the-counter and prescription medicines only as told by your health care provider.  Do not drive for 24 hours if you were given a sedative during your procedure.  Keep all follow-up visits as told by your health care provider. This is important. Contact a health care provider if you have:  A sore throat that lasts longer than one day.  Trouble swallowing. Get help right away if:  You vomit blood or your vomit looks like coffee grounds.  You have: ? A fever. ? Bloody, black, or tarry stools. ? A severe sore throat or you cannot swallow. ? Difficulty breathing. ? Severe pain in your chest or abdomen. Summary  After the procedure, it is common to have a sore throat, mild stomach discomfort, bloating, and nausea.  Do not drive for 24 hours if you were given a sedative during the procedure.  Follow instructions from your health care provider about what to eat or drink after your procedure.  Return to your normal activities as told by your health care provider. This information is not intended to replace advice given to you by your health care provider. Make sure you discuss any questions you have with your health care provider. Document Revised: 07/16/2017 Document Reviewed: 06/24/2017 Elsevier Patient Education  [employer].

## 2019-08-03 ENCOUNTER — Encounter (HOSPITAL_COMMUNITY): Payer: Self-pay

## 2019-08-03 ENCOUNTER — Other Ambulatory Visit (HOSPITAL_COMMUNITY)
Admission: RE | Admit: 2019-08-03 | Discharge: 2019-08-03 | Disposition: A | Discharge status: Home / Self Care | Payer: Medicaid Other | Source: Ambulatory Visit | Attending: Internal Medicine | Admitting: Internal Medicine

## 2019-08-03 ENCOUNTER — Encounter (HOSPITAL_COMMUNITY)
Admission: RE | Admit: 2019-08-03 | Discharge: 2019-08-03 | Disposition: A | Discharge status: Home / Self Care | Payer: Medicaid Other | Source: Ambulatory Visit | Attending: Internal Medicine | Admitting: Internal Medicine

## 2019-08-03 ENCOUNTER — Other Ambulatory Visit (HOSPITAL_COMMUNITY): Payer: Medicaid Other

## 2019-08-03 ENCOUNTER — Other Ambulatory Visit: Payer: Self-pay

## 2019-08-03 DIAGNOSIS — Z20822 Contact with and (suspected) exposure to covid-19: Secondary | ICD-10-CM | POA: Diagnosis not present

## 2019-08-03 DIAGNOSIS — Z01812 Encounter for preprocedural laboratory examination: Secondary | ICD-10-CM | POA: Diagnosis present

## 2019-08-03 LAB — PREGNANCY, URINE: Preg Test, Ur: NEGATIVE

## 2019-08-04 LAB — SARS CORONAVIRUS 2 (TAT 6-24 HRS): SARS Coronavirus 2: NEGATIVE

## 2019-08-05 ENCOUNTER — Other Ambulatory Visit: Payer: Self-pay

## 2019-08-05 ENCOUNTER — Ambulatory Visit (HOSPITAL_COMMUNITY)
Admission: RE | Admit: 2019-08-05 | Discharge: 2019-08-05 | Disposition: A | Discharge status: Home / Self Care | Payer: Medicaid Other | Attending: Internal Medicine | Admitting: Internal Medicine

## 2019-08-05 ENCOUNTER — Encounter (HOSPITAL_COMMUNITY)
Admission: RE | Disposition: A | Discharge status: Home / Self Care | Payer: Self-pay | Source: Home / Self Care | Attending: Internal Medicine

## 2019-08-05 ENCOUNTER — Encounter (HOSPITAL_COMMUNITY): Payer: Self-pay | Admitting: Internal Medicine

## 2019-08-05 ENCOUNTER — Ambulatory Visit (HOSPITAL_COMMUNITY): Payer: Medicaid Other | Admitting: Anesthesiology

## 2019-08-05 DIAGNOSIS — M1990 Unspecified osteoarthritis, unspecified site: Secondary | ICD-10-CM | POA: Diagnosis not present

## 2019-08-05 DIAGNOSIS — R112 Nausea with vomiting, unspecified: Secondary | ICD-10-CM | POA: Diagnosis not present

## 2019-08-05 DIAGNOSIS — F909 Attention-deficit hyperactivity disorder, unspecified type: Secondary | ICD-10-CM | POA: Insufficient documentation

## 2019-08-05 DIAGNOSIS — Z818 Family history of other mental and behavioral disorders: Secondary | ICD-10-CM | POA: Diagnosis not present

## 2019-08-05 DIAGNOSIS — R197 Diarrhea, unspecified: Secondary | ICD-10-CM | POA: Diagnosis not present

## 2019-08-05 DIAGNOSIS — R634 Abnormal weight loss: Secondary | ICD-10-CM | POA: Diagnosis not present

## 2019-08-05 DIAGNOSIS — Z79899 Other long term (current) drug therapy: Secondary | ICD-10-CM | POA: Diagnosis not present

## 2019-08-05 DIAGNOSIS — K648 Other hemorrhoids: Secondary | ICD-10-CM

## 2019-08-05 DIAGNOSIS — F17210 Nicotine dependence, cigarettes, uncomplicated: Secondary | ICD-10-CM | POA: Diagnosis not present

## 2019-08-05 DIAGNOSIS — R109 Unspecified abdominal pain: Secondary | ICD-10-CM | POA: Diagnosis not present

## 2019-08-05 DIAGNOSIS — K3189 Other diseases of stomach and duodenum: Secondary | ICD-10-CM | POA: Diagnosis not present

## 2019-08-05 DIAGNOSIS — F4310 Post-traumatic stress disorder, unspecified: Secondary | ICD-10-CM | POA: Insufficient documentation

## 2019-08-05 DIAGNOSIS — Z809 Family history of malignant neoplasm, unspecified: Secondary | ICD-10-CM | POA: Diagnosis not present

## 2019-08-05 DIAGNOSIS — K2100 Gastro-esophageal reflux disease with esophagitis, without bleeding: Secondary | ICD-10-CM | POA: Insufficient documentation

## 2019-08-05 DIAGNOSIS — R630 Anorexia: Secondary | ICD-10-CM

## 2019-08-05 DIAGNOSIS — F319 Bipolar disorder, unspecified: Secondary | ICD-10-CM | POA: Insufficient documentation

## 2019-08-05 DIAGNOSIS — K625 Hemorrhage of anus and rectum: Secondary | ICD-10-CM

## 2019-08-05 DIAGNOSIS — R110 Nausea: Secondary | ICD-10-CM

## 2019-08-05 DIAGNOSIS — R1110 Vomiting, unspecified: Secondary | ICD-10-CM

## 2019-08-05 DIAGNOSIS — Z888 Allergy status to other drugs, medicaments and biological substances status: Secondary | ICD-10-CM | POA: Diagnosis not present

## 2019-08-05 HISTORY — PX: COLONOSCOPY WITH PROPOFOL: SHX5780

## 2019-08-05 HISTORY — PX: BIOPSY: SHX5522

## 2019-08-05 HISTORY — PX: ESOPHAGOGASTRODUODENOSCOPY (EGD) WITH PROPOFOL: SHX5813

## 2019-08-05 SURGERY — COLONOSCOPY
Anesthesia: Moderate Sedation

## 2019-08-05 SURGERY — COLONOSCOPY WITH PROPOFOL
Anesthesia: General

## 2019-08-05 MED ORDER — PHENYLEPHRINE 40 MCG/ML (10ML) SYRINGE FOR IV PUSH (FOR BLOOD PRESSURE SUPPORT)
PREFILLED_SYRINGE | INTRAVENOUS | Status: AC
  Filled 2019-08-05: qty 10

## 2019-08-05 MED ORDER — DICYCLOMINE HCL 10 MG PO CAPS: 10.0000 mg | ORAL_CAPSULE | Freq: Two times a day (BID) | ORAL | 2 refills | Status: DC

## 2019-08-05 MED ORDER — PROPOFOL 10 MG/ML IV BOLUS
INTRAVENOUS | Status: AC
  Filled 2019-08-05: qty 60

## 2019-08-05 MED ORDER — PROPOFOL 10 MG/ML IV BOLUS
INTRAVENOUS | Status: AC
  Filled 2019-08-05: qty 20

## 2019-08-05 MED ORDER — PROPOFOL 500 MG/50ML IV EMUL
INTRAVENOUS | Status: DC | PRN
  Administered 2019-08-05: 150 ug/kg/min via INTRAVENOUS
  Administered 2019-08-05: 200 ug/kg/min via INTRAVENOUS
  Administered 2019-08-05: 150 ug/kg/min via INTRAVENOUS

## 2019-08-05 MED ORDER — PANTOPRAZOLE SODIUM 40 MG PO TBEC: 40.0000 mg | DELAYED_RELEASE_TABLET | Freq: Every day | ORAL | 5 refills | Status: DC

## 2019-08-05 MED ORDER — LACTATED RINGERS IV SOLN: INTRAVENOUS | Status: DC

## 2019-08-05 MED ORDER — PHENYLEPHRINE HCL (PRESSORS) 10 MG/ML IV SOLN
INTRAVENOUS | Status: DC | PRN
Start: 2019-08-05 — End: 2019-08-05
  Administered 2019-08-05: 40 ug via INTRAVENOUS

## 2019-08-05 MED ORDER — PROPOFOL 10 MG/ML IV BOLUS
INTRAVENOUS | Status: DC | PRN
  Administered 2019-08-05: 100 mg via INTRAVENOUS
  Administered 2019-08-05: 30 mg via INTRAVENOUS

## 2019-08-05 MED ORDER — SODIUM CHLORIDE 0.9 % IV SOLN: INTRAVENOUS | Status: DC

## 2019-08-05 MED ORDER — KETAMINE HCL 50 MG/5ML IJ SOSY
PREFILLED_SYRINGE | INTRAMUSCULAR | Status: AC
  Filled 2019-08-05: qty 5

## 2019-08-05 MED ORDER — LIDOCAINE 2% (20 MG/ML) 5 ML SYRINGE
INTRAMUSCULAR | Status: AC
  Filled 2019-08-05: qty 5

## 2019-08-05 MED ORDER — CHLORHEXIDINE GLUCONATE CLOTH 2 % EX PADS: 6.0000 | MEDICATED_PAD | Freq: Once | CUTANEOUS | Status: DC

## 2019-08-05 MED ORDER — LIDOCAINE HCL (CARDIAC) PF 50 MG/5ML IV SOSY
PREFILLED_SYRINGE | INTRAVENOUS | Status: DC | PRN
  Administered 2019-08-05: 100 mg via INTRAVENOUS

## 2019-08-05 MED ORDER — KETAMINE HCL 10 MG/ML IJ SOLN
INTRAMUSCULAR | Status: DC | PRN
  Administered 2019-08-05: 10 mg via INTRAVENOUS
  Administered 2019-08-05: 15 mg via INTRAVENOUS

## 2019-08-05 NOTE — Anesthesia Postprocedure Evaluation (Signed)
Anesthesia Post Note  Patient: [name]  Procedure(s) Performed: COLONOSCOPY WITH PROPOFOL (N/A ) ESOPHAGOGASTRODUODENOSCOPY (EGD) WITH PROPOFOL (N/A ) BIOPSY  Patient location during evaluation: PACU Anesthesia Type: General Level of consciousness: awake and alert and patient cooperative Pain management: satisfactory to patient Vital Signs Assessment: post-procedure vital signs reviewed and stable Respiratory status: spontaneous breathing Cardiovascular status: stable Postop Assessment: no apparent nausea or vomiting Anesthetic complications: no   No complications documented.   Last Vitals:  Vitals:   08/05/19 1615 08/05/19 1617  BP: 100/68 102/70  Pulse: 75 78  Resp: 16 16  Temp:    SpO2: 100% 99%    Last Pain:  Vitals:   08/05/19 1617  TempSrc:   PainSc: 0-No pain                 Ashlay Altieri

## 2019-08-05 NOTE — Anesthesia Preprocedure Evaluation (Addendum)
Anesthesia Evaluation  Patient identified by MRN, date of birth, ID band Patient awake    Reviewed: Allergy & Precautions, H&P , NPO status , Patient's Chart, lab work & pertinent test results, reviewed documented beta blocker date and time   Airway Mallampati: II  TM Distance: >3 FB Neck ROM: full    Dental no notable dental hx.    Pulmonary neg pulmonary ROS, Current Smoker and Patient abstained from smoking.,    Pulmonary exam normal breath sounds clear to auscultation       Cardiovascular Exercise Tolerance: Good negative cardio ROS   Rhythm:regular Rate:Normal     Neuro/Psych Seizures -,  PSYCHIATRIC DISORDERS Anxiety Depression Bipolar Disorder    GI/Hepatic negative GI ROS, Neg liver ROS,   Endo/Other  negative endocrine ROS  Renal/GU negative Renal ROS  negative genitourinary   Musculoskeletal   Abdominal   Peds  Hematology negative hematology ROS (+)   Anesthesia Other Findings   Reproductive/Obstetrics negative OB ROS                            Anesthesia Physical Anesthesia Plan  ASA: III  Anesthesia Plan: General   Post-op Pain Management:    Induction:   PONV Risk Score and Plan: 3 and Propofol infusion  Airway Management Planned:   Additional Equipment:   Intra-op Plan:   Post-operative Plan:   Informed Consent: I have reviewed the patients History and Physical, chart, labs and discussed the procedure including the risks, benefits and alternatives for the proposed anesthesia with the patient or authorized representative who has indicated his/her understanding and acceptance.     Dental Advisory Given  Plan Discussed with: CRNA  Anesthesia Plan Comments:         Anesthesia Quick Evaluation

## 2019-08-05 NOTE — Transfer of Care (Signed)
Immediate Anesthesia Transfer of Care Note  Patient: [name]  Procedure(s) Performed: COLONOSCOPY WITH PROPOFOL (N/A ) ESOPHAGOGASTRODUODENOSCOPY (EGD) WITH PROPOFOL (N/A ) BIOPSY  Patient Location: PACU  Anesthesia Type:General  Level of Consciousness: drowsy and patient cooperative  Airway & Oxygen Therapy: Patient Spontanous Breathing and Patient connected to nasal cannula oxygen  Post-op Assessment: Report given to RN and Post -op Vital signs reviewed and stable  Post vital signs: Reviewed and stable  Last Vitals:  Vitals Value Taken Time  BP 110/60 08/05/19 1551  Temp 98.1   Pulse 76 08/05/19 1552  Resp 8 08/05/19 1552  SpO2 100 % 08/05/19 1552  Vitals shown include unvalidated device data.  Last Pain:  Vitals:   08/05/19 1457  TempSrc:   PainSc: 0-No pain      Patients Stated Pain Goal: 8 (06/15/00 1117)  Complications: No complications documented.

## 2019-08-05 NOTE — Op Note (Signed)
[facility] Patient Name: [name] Procedure Date: 08/05/2019 3:19 PM MRN: [id number] Date of Birth: 06-17-1980 Attending MD: Hildred Laser , MD CSN: [id number] Age: 39 Admit Type: Outpatient Procedure:                Colonoscopy Indications:              Clinically significant diarrhea of unexplained                            origin, Rectal bleeding Providers:                Hildred Laser, MD, Ishi Page, Raphael Gibney,                            Technician Referring MD:             Stoney Bang, MD Medicines:                Propofol per Anesthesia Complications:            No immediate complications. Estimated Blood Loss:     Estimated blood loss was minimal. Procedure:                Pre-Anesthesia Assessment:                           - Prior to the procedure, a History and Physical                            was performed, and patient medications and                            allergies were reviewed. The patient's tolerance of                            previous anesthesia was also reviewed. The risks                            and benefits of the procedure and the sedation                            options and risks were discussed with the patient.                            All questions were answered, and informed consent                            was obtained. Prior Anticoagulants: The patient has                            taken no previous anticoagulant or antiplatelet                            agents. ASA Grade Assessment: II - A patient with                            mild  systemic disease. After reviewing the risks                            and benefits, the patient was deemed in                            satisfactory condition to undergo the procedure.                           After obtaining informed consent, the colonoscope                            was passed under direct vision. Throughout the                            procedure, the patient's  blood pressure, pulse, and                            oxygen saturations were monitored continuously. The                            [id number]-[id number] ([id number]) scope was introduced through                            the anus and advanced to the the terminal ileum,                            with identification of the appendiceal orifice and                            IC valve. The colonoscopy was performed without                            difficulty. The patient tolerated the procedure                            well. The quality of the bowel preparation was                            marginal. The terminal ileum, ileocecal valve,                            appendiceal orifice, and rectum were photographed. Scope In: 3:21:33 PM Scope Out: 3:43:53 PM Scope Withdrawal Time: 0 hours 11 minutes 25 seconds  Total Procedure Duration: 0 hours 22 minutes 20 seconds  Findings:      The perianal and digital rectal examinations were normal.      The terminal ileum appeared normal.      The colon (entire examined portion) appeared normal. Biopsies for       histology were taken with a cold forceps from the ascending colon and       sigmoid colon for evaluation of microscopic colitis. The pathology       specimen was placed into Bottle Number 3.      Internal hemorrhoids were found during  retroflexion. The hemorrhoids       were small. Impression:               - The examined portion of the ileum was normal.                           - The entire examined colon is normal. Biopsied.                           - Internal hemorrhoids. Moderate Sedation:      Per Anesthesia Care Recommendation:           - Patient has a contact number available for                            emergencies. The signs and symptoms of potential                            delayed complications were discussed with the                            patient. Return to normal activities tomorrow.                            Written  discharge instructions were provided to the                            patient.                           - Resume previous diet today.                           - Continue present medications.                           - No aspirin, ibuprofen, naproxen, or other                            non-steroidal anti-inflammatory drugs for 1 day.                           Dicyclomine 10 mg po bid.                           - Await pathology results. Procedure Code(s):        --- Professional ---                           [phone], Colonoscopy, flexible; with biopsy, single                            or multiple Diagnosis Code(s):        --- Professional ---                           K64.8, Other hemorrhoids  R19.7, Diarrhea, unspecified                           K62.5, Hemorrhage of anus and rectum CPT copyright 2019 American Medical Association. All rights reserved. The codes documented in this report are preliminary and upon coder review may  be revised to meet current compliance requirements. Hildred Laser, MD Hildred Laser, MD 08/05/2019 4:01:25 PM This report has been signed electronically. Number of Addenda: 0

## 2019-08-05 NOTE — Anesthesia Procedure Notes (Addendum)
Date/Time: 08/05/2019 2:56 PM Performed by: Vista Deck, CRNA Pre-anesthesia Checklist: Patient identified, Emergency Drugs available, Suction available, Timeout performed and Patient being monitored Patient Re-evaluated:Patient Re-evaluated prior to induction Oxygen Delivery Method: Non-rebreather mask

## 2019-08-05 NOTE — Discharge Instructions (Signed)
No aspirin or NSAIDs for 24 hours. Resume usual medications as before. Pantoprazole 40 mg by mouth 30 minutes before breakfast daily. Dicyclomine 10 mg by mouth 30 minutes before breakfast and evening meal daily. Resume usual diet. No driving for 24 hours. Physician will call with biopsy results and further recommendations.

## 2019-08-05 NOTE — H&P (Signed)
[name] is an 39 y.o. female.   Chief Complaint: Patient is here for esophagogastroduodenoscopy and colonoscopy. HPI: Patient is 39 year old Caucasian female who presents with chronic diarrhea abdominal pain and nausea.  She was evaluated by Dr. Reather Laurence of [facility] and underwent EGD and colonoscopy in July 2017 and both the studies were unremarkable including biopsies.  Patient was seen in the office last month with worsening symptoms over the last few months.  She has experienced nausea anorexia 15 pound weight loss.  She is having an average of 6 stools per day.  She is also passing fresh blood and clots at times.  She does not take NSAIDs. She also has no rash over her face and thighs.  She has not been able to see the dermatologist yet. She does not take OTC NSAIDs. Family history is negative for celiac disease IBD or CRC. GI pathogen panel last month was negative.  Past Medical History:  Diagnosis Date  . ADD (attention deficit disorder)   . ADHD (attention deficit hyperactivity disorder)   . Anxiety   . Arthritis   . Bipolar 1 disorder ([place])   .    Marland Kitchen Degenerative disc disease   . Depression   . PTSD (post-traumatic stress disorder)     Past Surgical History:  Procedure Laterality Date  . COLONOSCOPY    . WISDOM TOOTH EXTRACTION      Family History  Problem Relation Age of Onset  . Cancer Father   . Cancer Mother   . Mental illness Mother    Social History:  reports that she has been smoking cigarettes. She has a 4.25 pack-year smoking history. She has never used smokeless tobacco. She reports current alcohol use. She reports current drug use. Drug: Marijuana.  Allergies:  Allergies  Allergen Reactions  . Geodon [Ziprasidone Hcl] Other (See Comments)    Severe shaking and jerking  . Prednisone Hives    "can not take steroids"    Medications Prior to Admission  Medication Sig Dispense Refill  . albuterol (VENTOLIN HFA) 108 (90 Base)  MCG/ACT inhaler Inhale 1-2 puffs into the lungs every 6 (six) hours as needed for wheezing or shortness of breath.    . Multiple Vitamins-Minerals (MULTIVITAMIN ADULT) CHEW Chew 1 each by mouth daily.     Marland Kitchen neomycin-bacitracin-polymyxin (NEOSPORIN) ointment Apply 1 application topically 2 (two) times daily as needed for wound care.    . sertraline (ZOLOFT) 50 MG tablet Take 50 mg by mouth at bedtime.     . traZODone (DESYREL) 100 MG tablet Take 100 mg by mouth at bedtime as needed for sleep.    Marland Kitchen docusate sodium (COLACE) 100 MG capsule Take 2 tablets daily until you have regular bowel movements then 1 tablet daily until stools are soft. (Patient not taking: Reported on 07/27/2019) 30 capsule 0  . hyoscyamine (LEVSIN SL) 0.125 MG SL tablet Place 1 tablet (0.125 mg total) under the tongue every 6 (six) hours as needed (diarrhea, abd pain). (Patient not taking: Reported on 07/29/2019) 30 tablet 1    Results for orders placed or performed during the hospital encounter of 08/03/19 (from the past 48 hour(s))  SARS CORONAVIRUS 2 (TAT 6-24 HRS) Nasopharyngeal Nasopharyngeal Swab     Status: None   Collection Time: 08/03/19  3:19 PM   Specimen: Nasopharyngeal Swab  Result Value Ref Range   SARS Coronavirus 2 NEGATIVE NEGATIVE    Comment: (NOTE) SARS-CoV-2 target nucleic acids are NOT DETECTED.  The  SARS-CoV-2 RNA is generally detectable in upper and lower respiratory specimens during the acute phase of infection. Negative results do not preclude SARS-CoV-2 infection, do not rule out co-infections with other pathogens, and should not be used as the sole basis for treatment or other patient management decisions. Negative results must be combined with clinical observations, patient history, and epidemiological information. The expected result is Negative.  Fact Sheet for Patients: SugarRoll.be  Fact Sheet for Healthcare  Providers: https://www.woods-mathews.com/  This test is not yet approved or cleared by the [place] FDA and  has been authorized for detection and/or diagnosis of SARS-CoV-2 by FDA under an Emergency Use Authorization (EUA). This EUA will remain  in effect (meaning this test can be used) for the duration of the COVID-19 declaration under Se ction 564(b)(1) of the Act, 21 U.S.C. section 360bbb-3(b)(1), unless the authorization is terminated or revoked sooner.  Performed at [facility], [place] [address] [place], [place] [address]   Pregnancy, urine     Status: None   Collection Time: 08/03/19  3:32 PM  Result Value Ref Range   Preg Test, Ur NEGATIVE NEGATIVE    Comment:        THE SENSITIVITY OF THIS METHODOLOGY IS >20 mIU/mL. Performed at [facility], [address], [place], [place] [address]    No results found.  Review of Systems  Blood pressure 139/84, pulse 92, temperature 97.7 F (36.5 C), temperature source Oral, resp. rate 18, last menstrual period 08/04/2019, SpO2 99 %. Physical Exam HENT:     Mouth/Throat:     Mouth: Mucous membranes are moist.     Pharynx: Oropharynx is clear.  Eyes:     General: No scleral icterus.    Conjunctiva/sclera: Conjunctivae normal.  Cardiovascular:     Rate and Rhythm: Normal rate and regular rhythm.     Heart sounds: Normal heart sounds. No murmur heard.   Pulmonary:     Effort: Pulmonary effort is normal.     Breath sounds: Normal breath sounds.  Abdominal:     Comments: Abdomen is symmetrical.  It is soft with mild generalized tenderness except not in epigastrium.  No organomegaly or masses.  Musculoskeletal:     Cervical back: Neck supple.  Lymphadenopathy:     Cervical: No cervical adenopathy.  Skin:    General: Skin is warm and dry.     Comments: Macular rash over left side of face.  Neurological:     Mental Status: She is alert.  Psychiatric:        Mood and Affect: Mood normal.       Assessment/Plan Nausea abdominal pain anorexia weight loss diarrhea and rectal bleeding. Diagnostic esophagogastroduodenoscopy and colonoscopy. Hildred Laser, MD 08/05/2019, 2:53 PM

## 2019-08-05 NOTE — Op Note (Signed)
[facility] Patient Name: [name] Procedure Date: 08/05/2019 2:55 PM MRN: [id number] Date of Birth: September 24, 1980 Attending MD: Hildred Laser , MD CSN: [id number] Age: 39 Admit Type: Outpatient Procedure:                Upper GI endoscopy Indications:              Abdominal pain, Anorexia, Nausea, Vomiting, Weight                            loss Providers:                Hildred Laser, MD, Deasiah Page, Raphael Gibney,                            Technician Referring MD:             Stoney Bang, MD Medicines:                Propofol per Anesthesia Complications:            No immediate complications. Estimated Blood Loss:     Estimated blood loss was minimal. Procedure:                Pre-Anesthesia Assessment:                           - Prior to the procedure, a History and Physical                            was performed, and patient medications and                            allergies were reviewed. The patient's tolerance of                            previous anesthesia was also reviewed. The risks                            and benefits of the procedure and the sedation                            options and risks were discussed with the patient.                            All questions were answered, and informed consent                            was obtained. Prior Anticoagulants: The patient has                            taken no previous anticoagulant or antiplatelet                            agents. ASA Grade Assessment: II - A patient with  mild systemic disease. After reviewing the risks                            and benefits, the patient was deemed in                            satisfactory condition to undergo the procedure.                           After obtaining informed consent, the endoscope was                            passed under direct vision. Throughout the                            procedure, the patient's blood  pressure, pulse, and                            oxygen saturations were monitored continuously. The                            GIF-H190 ([id number]) scope was introduced through the                            mouth, and advanced to the second part of duodenum.                            The upper GI endoscopy was accomplished without                            difficulty. The patient tolerated the procedure                            well. Scope In: 3:04:34 PM Scope Out: 3:17:14 PM Total Procedure Duration: 0 hours 12 minutes 40 seconds  Findings:      The hypopharynx was normal.      The proximal esophagus and mid esophagus were normal.      LA Grade A (one or more mucosal breaks less than 5 mm, not extending       between tops of 2 mucosal folds) esophagitis was found 38 to 39 cm from       the incisors.      A few erosions with no stigmata of recent bleeding were found in the       gastric antrum. Biopsies were taken with a cold forceps for histology.       The pathology specimen was placed into Bottle Number 2.      The exam of the stomach was otherwise normal.      The duodenal bulb and second portion of the duodenum were normal.       Biopsies for histology were taken with a cold forceps for evaluation of       celiac disease. The pathology specimen was placed into Bottle Number 1. Impression:               - Normal hypopharynx.                           -  Normal proximal esophagus and mid esophagus.                           - LA Grade A reflux esophagitis.                           - Erosive gastropathy with no stigmata of recent                            bleeding. Biopsied.                           - Normal duodenal bulb and second portion of the                            duodenum. Biopsied. Moderate Sedation:      Per Anesthesia Care Recommendation:           - Patient has a contact number available for                            emergencies. The signs and symptoms of  potential                            delayed complications were discussed with the                            patient. Return to normal activities tomorrow.                            Written discharge instructions were provided to the                            patient.                           - Resume previous diet today.                           - Continue present medications.                           - No aspirin, ibuprofen, naproxen, or other                            non-steroidal anti-inflammatory drugs for 1 day.                           - Await pathology results.                           - Use Protonix (pantoprazole) 40 mg PO daily today. Procedure Code(s):        --- Professional ---                           [phone], Esophagogastroduodenoscopy, flexible,  transoral; with biopsy, single or multiple Diagnosis Code(s):        --- Professional ---                           K21.00, Gastro-esophageal reflux disease with                            esophagitis, without bleeding                           K31.89, Other diseases of stomach and duodenum                           R10.9, Unspecified abdominal pain                           R63.0, Anorexia                           R11.0, Nausea                           R11.10, Vomiting, unspecified                           R63.4, Abnormal weight loss CPT copyright 2019 American Medical Association. All rights reserved. The codes documented in this report are preliminary and upon coder review may  be revised to meet current compliance requirements. Hildred Laser, MD Hildred Laser, MD 08/05/2019 3:56:55 PM This report has been signed electronically. Number of Addenda: 0

## 2019-08-07 ENCOUNTER — Other Ambulatory Visit: Payer: Self-pay

## 2019-08-07 ENCOUNTER — Encounter (HOSPITAL_COMMUNITY): Payer: Self-pay | Admitting: Internal Medicine

## 2019-08-07 LAB — SURGICAL PATHOLOGY

## 2019-09-10 ENCOUNTER — Ambulatory Visit (INDEPENDENT_AMBULATORY_CARE_PROVIDER_SITE_OTHER): Payer: Medicaid Other | Admitting: Gastroenterology

## 2019-09-10 ENCOUNTER — Other Ambulatory Visit: Payer: Self-pay

## 2019-09-10 ENCOUNTER — Encounter (INDEPENDENT_AMBULATORY_CARE_PROVIDER_SITE_OTHER): Payer: Self-pay | Admitting: Gastroenterology

## 2019-09-10 VITALS — BP 118/71 | HR 112 | Temp 97.6°F | Ht 64.0 in | Wt 192.3 lb

## 2019-09-10 DIAGNOSIS — R109 Unspecified abdominal pain: Secondary | ICD-10-CM | POA: Diagnosis not present

## 2019-09-10 DIAGNOSIS — R197 Diarrhea, unspecified: Secondary | ICD-10-CM

## 2019-09-10 MED ORDER — DICYCLOMINE HCL 10 MG PO CAPS
10.0000 mg | ORAL_CAPSULE | Freq: Three times a day (TID) | ORAL | 1 refills | Status: DC
Start: 2019-09-10 — End: 2023-11-06

## 2019-09-10 NOTE — Progress Notes (Signed)
Patient profile: [name] is a 39 y.o. female seen for follow-up, she last seen June 2021 for rectal bleeding with diarrhea and 4-5 loose bowel movements a day.  Also had abdominal cramping and nausea.  She had a colonoscopy and upper endoscopy 08/05/19, seen today for follow-up.  History of Present Illness: [name] is seen today for continued symptoms, she does report diarrhea is slightly improved. Having 3-7 BM/day, usually around #6 on bristal stool sale. She is having less frequent bleeding and a small amount on her initial office visit but still does continue to have abdominal rectal bleeding.  Continues to have "contraction like pain" throughout the day. She is taking dicyclomine BID which helps but does not resolve pain. Denies significant post prandial urgecny w/ BM.  Decribes waves of nausea with some fullness throughout entire abd. Takes protonix, having rare GERD at this point. Avoiding meats as these make symptoms worse.  Denies vomiting.  No dysphagia.   Denies family history of crohn's or ulcerative. Smokes 1 cig/day. No alcohol. Marijuana 1x/week for appetite.   Wt Readings from Last 3 Encounters:  09/10/19 192 lb 4.8 oz (87.2 kg)  08/03/19 175 lb (79.4 kg)  07/27/19 186 lb 14.4 oz (84.8 kg)     Last Colonoscopy: 07/2019-- The examined portion of the ileum was normal. - The entire examined colon is normal. Biopsied. - Small Internal hemorrhoids.   Last Endoscopy: 07/2019-Normal hypopharynx. - Normal proximal esophagus and mid esophagus. - LA Grade A reflux esophagitis. - Erosive gastropathy with no stigmata of recent bleeding. Biopsied. - Normal duodenal bulb and second portion of the duodenum. Biopsied.   Past Medical History:  Past Medical History:  Diagnosis Date  . ADD (attention deficit disorder)   . ADHD (attention deficit hyperactivity disorder)   . Anxiety   . Arthritis   . Bipolar 1 disorder ([place])   . Bipolar depression ([place])   .  Degenerative disc disease   . Depression   . PTSD (post-traumatic stress disorder)     Problem List: Patient Active Problem List   Diagnosis Date Noted  . Tobacco use disorder 11/12/2016  . Major depressive disorder, recurrent severe without psychotic features ([place]) 11/12/2016  . MDD (major depressive disorder) 11/28/2015  . Bipolar disorder, curr episode mixed, severe, w/o psychotic features ([place]) 07/18/2015  . Cocaine use disorder, mild, abuse ([place]) 07/18/2015  . Opioid use disorder, mild, abuse ([place]) 07/18/2015  . History of ADHD 07/18/2015  . BV (bacterial vaginosis)   . Cannabis use disorder, mild, abuse 06/18/2014  . Seizure disorder ([place]) 06/18/2014  . PTSD (post-traumatic stress disorder) 06/16/2014  . Polysubstance (including opioids) dependence with physiol dependence ([place]) 02/13/2013    Past Surgical History: Past Surgical History:  Procedure Laterality Date  . BIOPSY  08/05/2019   Procedure: BIOPSY;  Surgeon: Rogene Houston, MD;  [facility];  Service: Endoscopy;;  . COLONOSCOPY    . COLONOSCOPY WITH PROPOFOL N/A 08/05/2019   Procedure: COLONOSCOPY WITH PROPOFOL;  Surgeon: Rogene Houston, MD;  [facility];  Service: Endoscopy;  Laterality: N/A;  . ESOPHAGOGASTRODUODENOSCOPY (EGD) WITH PROPOFOL N/A 08/05/2019   Procedure: ESOPHAGOGASTRODUODENOSCOPY (EGD) WITH PROPOFOL;  Surgeon: Rogene Houston, MD;  [facility];  Service: Endoscopy;  Laterality: N/A;  . WISDOM TOOTH EXTRACTION      Allergies: Allergies  Allergen Reactions  . Geodon [Ziprasidone Hcl] Other (See Comments)    Severe shaking and jerking  . Prednisone Hives    "  can not take steroids"      Home Medications:  Current Outpatient Medications:  .  albuterol (VENTOLIN HFA) 108 (90 Base) MCG/ACT inhaler, Inhale 1-2 puffs into the lungs every 6 (six) hours as needed for wheezing or shortness of breath., Disp: , Rfl:  .  Multiple Vitamins-Minerals (MULTIVITAMIN ADULT)  CHEW, Chew 1 each by mouth daily. , Disp: , Rfl:  .  neomycin-bacitracin-polymyxin (NEOSPORIN) ointment, Apply 1 application topically 2 (two) times daily as needed for wound care., Disp: , Rfl:  .  pantoprazole (PROTONIX) 40 MG tablet, Take 1 tablet (40 mg total) by mouth daily before breakfast., Disp: 30 tablet, Rfl: 5 .  sertraline (ZOLOFT) 50 MG tablet, Take 50 mg by mouth at bedtime. , Disp: , Rfl:  .  traZODone (DESYREL) 100 MG tablet, Take 100 mg by mouth at bedtime as needed for sleep., Disp: , Rfl:  .  dicyclomine (BENTYL) 10 MG capsule, Take 1 capsule (10 mg total) by mouth 4 (four) times daily -  before meals and at bedtime., Disp: 120 capsule, Rfl: 1   Family History: family history includes Cancer in her father and mother; Mental illness in her mother.   Mother had breast cancer-passed at age 5 Father - liver, brain, and lung cancer - passed at age 59    Social History:   reports that she has been smoking cigarettes. She has a 4.25 pack-year smoking history. She has never used smokeless tobacco. She reports current alcohol use. She reports current drug use. Drug: Marijuana.   Review of Systems: Constitutional: Denies weight loss/weight gain  Eyes: No changes in vision. ENT: No oral lesions, sore throat.  GI: see HPI.  Heme/Lymph: No easy bruising.  CV: No chest pain.  GU: No hematuria.  Integumentary: No rashes.  Neuro: No headaches.  Psych: No depression/anxiety.  Endocrine: No heat/cold intolerance.  Allergic/Immunologic: No urticaria.  Resp: No cough, SOB.  Musculoskeletal: No joint swelling.    Physical Examination: BP 118/71 (BP Location: Right Arm, Patient Position: Sitting, Cuff Size: Large)   Pulse (!) 112   Temp 97.6 F (36.4 C) (Oral)   Ht 5\' 4"  (1.626 m)   Wt 192 lb 4.8 oz (87.2 kg)   BMI 33.01 kg/m  Gen: NAD, alert and oriented x 4 HEENT: PEERLA, EOMI, Neck: supple, no JVD Chest: CTA bilaterally, no wheezes, crackles, or other adventitious  sounds CV: RRR, no m/g/c/r Abd: soft, mild diffuse TTP, ND, +BS in all four quadrants; no HSM, guarding, ridigity, or rebound tenderness Ext: no edema, well perfused with 2+ pulses, Skin: Erythematous rash on multiple parts of body. Lymph: no noted LAD  Data Reviewed:  CT a/p 06/2019-1. No acute intra-abdominal abnormality to explain the patient's symptoms. 2. Moderate to large colonic stool burden. Correlate for constipation/slowed intestinal transit. 3. Small fat containing umbilical hernia. No bowel containing hernias. 4. Reported skin lesions are not well visualized on this CT examination. No cutaneous abscess or collection is seen.     Assessment/Plan: Ms. Auvil is a 39 y.o. female   1. Abdominal pain/diarrhea suspect symptoms represent underlying irritable bowel.  She has had imaging as well as EGD/colonoscopy that have been unremarkable.  She denies any postprandial urgency so we will hold off on checking fecal elastase.  We will have her increase dicyclomine to from 2x/day to 4 times a day as well as keep a more specific food log of symptom occurrences and triggers.  2.  GERD-well-controlled.  Continue PPI dose.  She will  follow-up in 2 months with Dr. Laural Golden. She will call sooner if symptoms worsen.      I personally performed the service, non-incident to. (WP)  Laurine Blazer, [facility]

## 2019-09-10 NOTE — Patient Instructions (Signed)
You can increase dicyclomine to 10 mg 4 times a day.  Please keep a food log of what you are eating on the days the diarrhea is the worst and bring this to your follow-up appointment.  Continue Protonix.  Avoid a lot of fatty greasy foods.

## 2019-11-24 ENCOUNTER — Telehealth (INDEPENDENT_AMBULATORY_CARE_PROVIDER_SITE_OTHER): Payer: Self-pay | Admitting: *Deleted

## 2019-11-24 ENCOUNTER — Ambulatory Visit (INDEPENDENT_AMBULATORY_CARE_PROVIDER_SITE_OTHER): Payer: Medicaid Other | Admitting: Internal Medicine

## 2019-11-24 NOTE — Telephone Encounter (Signed)
Patient was a no show to see Dr.Rehman on 11/24/2019.

## 2020-03-30 ENCOUNTER — Telehealth (INDEPENDENT_AMBULATORY_CARE_PROVIDER_SITE_OTHER): Payer: Self-pay

## 2020-03-30 ENCOUNTER — Ambulatory Visit (INDEPENDENT_AMBULATORY_CARE_PROVIDER_SITE_OTHER): Payer: Medicaid Other | Admitting: Gastroenterology

## 2020-03-30 NOTE — Telephone Encounter (Signed)
noted 

## 2020-03-30 NOTE — Telephone Encounter (Signed)
Patient no showed for her appointment with Dr. Jenetta Downer with  GI on 03/30/2020.

## 2020-06-06 ENCOUNTER — Ambulatory Visit (INDEPENDENT_AMBULATORY_CARE_PROVIDER_SITE_OTHER): Payer: Medicaid Other | Admitting: Gastroenterology

## 2020-06-06 ENCOUNTER — Telehealth (INDEPENDENT_AMBULATORY_CARE_PROVIDER_SITE_OTHER): Payer: Self-pay

## 2020-06-06 ENCOUNTER — Encounter (INDEPENDENT_AMBULATORY_CARE_PROVIDER_SITE_OTHER): Payer: Self-pay | Admitting: *Deleted

## 2020-06-06 ENCOUNTER — Encounter (INDEPENDENT_AMBULATORY_CARE_PROVIDER_SITE_OTHER): Payer: Self-pay | Admitting: Gastroenterology

## 2020-06-06 NOTE — Telephone Encounter (Signed)
Patient no showed for her follow up appointment with Dr.Daniel Jenetta Downer on 06/06/2020.

## 2020-10-19 ENCOUNTER — Encounter (INDEPENDENT_AMBULATORY_CARE_PROVIDER_SITE_OTHER): Payer: Self-pay | Admitting: Internal Medicine

## 2020-10-25 ENCOUNTER — Ambulatory Visit (INDEPENDENT_AMBULATORY_CARE_PROVIDER_SITE_OTHER): Payer: Medicaid Other | Admitting: Internal Medicine

## 2020-11-29 ENCOUNTER — Ambulatory Visit (INDEPENDENT_AMBULATORY_CARE_PROVIDER_SITE_OTHER): Payer: Medicaid Other | Admitting: Gastroenterology

## 2020-11-29 ENCOUNTER — Other Ambulatory Visit: Payer: Self-pay

## 2020-11-29 ENCOUNTER — Encounter (INDEPENDENT_AMBULATORY_CARE_PROVIDER_SITE_OTHER): Payer: Self-pay | Admitting: Gastroenterology

## 2020-11-29 VITALS — BP 119/84 | HR 105 | Temp 97.5°F | Ht 64.0 in | Wt 212.1 lb

## 2020-11-29 DIAGNOSIS — R112 Nausea with vomiting, unspecified: Secondary | ICD-10-CM | POA: Diagnosis not present

## 2020-11-29 DIAGNOSIS — R1011 Right upper quadrant pain: Secondary | ICD-10-CM | POA: Diagnosis not present

## 2020-11-29 DIAGNOSIS — R197 Diarrhea, unspecified: Secondary | ICD-10-CM | POA: Diagnosis not present

## 2020-11-29 MED ORDER — HYOSCYAMINE SULFATE 0.125 MG SL SUBL: 0.1250 mg | SUBLINGUAL_TABLET | SUBLINGUAL | 3 refills | Status: DC | PRN

## 2020-11-29 NOTE — Patient Instructions (Addendum)
We will check some stool studies to rule out infectious causes, or parasites.   I have also ordered an Ultrasound to further evaluate your gallbladder.  I have sent Levsin to your pharmacy, this can help with abdominal pain and diarrhea, you can take it up to every 4 hours as needed.   Given your colonoscopy and EGD both done about a year ago, its unlikely repeating these would show something different. It may be a good idea to talk to your PCP about referring you to a specialist such as rheumatology who can further evaluate your symptoms.

## 2020-11-29 NOTE — Progress Notes (Signed)
Referring Provider: Neale Burly, MD Primary Care Physician:  Neale Burly, MD Primary GI Physician: Laural Golden  Chief Complaint  Patient presents with   Follow-up    Patient states she has some issues with RUQ pain and swelling after a meal. She has some issue with bloating. She has some nausea at times, she has some diarrhea. She states there is something spiky in her stools. 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   HPI:   [name] is a 40 y.o. female with past medical history of ADD, anxiety, bipolar 1, PTSD and DDD.  Patient presenting today for follow up of diarrhea.   She has a long history, atleast 2 years of diarrhea as well as elimination of stringy, clumpy looking substance. She has been evaluated with multiple abdominal imaging, EGD and colonoscopy without significant findings. No evidence of IBD or microscopic colitis.   It was previously Suspected some underlying IBS was causing her diarrhea and abdominal pain. At last visit her bentyl was increased to 4x/day which she reports provided no improvement in her symptoms, PCP took her off of bentyl and her PPI. 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 She reports PCP suspected she could have an autoimmune disorder contributing to her GI and dermatological symptoms, however, she has been on multiple medications, to include chemotherapy and psychiatric medications without resolve. She is supposed to go see a Paediatric nurse at [facility] in the near future.   She has some occasional nausea but minimal vomiting. She reports  that she does not have any heartburn or reflux symptoms. She also has some pain to RUQ and R upper back that has been going on for a few months. this pain occurs sometimes after eating and sometimes when she is lying down. She often feels bloated and belches a lot after eating.  She denies weight loss but states that she does not have much of an appetite recently, she reports.  Fam hx:no first degree relatives with CRC  Last Colonoscopy:6/2021The examined portion of the ileum was normal. - The entire examined colon is normal. Benign colonic mucosa - Small Internal hemorrhoids. Last Endoscopy:6/2021Normal hypopharynx. - Normal proximal esophagus and mid esophagus. - LA Grade A reflux esophagitis. H pylori neg - Erosive gastropathy with no stigmata of recent bleeding.  - Normal duodenal bulb and second portion of the duodenum.   Past Medical History:  Diagnosis Date   ADD (attention deficit disorder)    ADHD (attention deficit hyperactivity disorder)    Anxiety    Arthritis    Bipolar 1 disorder ([facility])    Bipolar depression ([place])    Degenerative disc disease    Depression    PTSD (post-traumatic stress disorder)     Past Surgical History:  Procedure Laterality Date   BIOPSY  08/05/2019   Procedure: BIOPSY;  Surgeon: Rogene Houston, MD;  [facility];  Service: Endoscopy;;   COLONOSCOPY     COLONOSCOPY WITH PROPOFOL N/A 08/05/2019   Procedure: COLONOSCOPY WITH PROPOFOL;  Surgeon: Rogene Houston,  MD;  [facility];  Service: Endoscopy;  Laterality: N/A;   ESOPHAGOGASTRODUODENOSCOPY (EGD) WITH PROPOFOL N/A 08/05/2019   Procedure: ESOPHAGOGASTRODUODENOSCOPY (EGD) WITH PROPOFOL;  Surgeon: Rogene Houston, MD;  [facility];  Service: Endoscopy;  Laterality: N/A;   WISDOM TOOTH EXTRACTION      Current Outpatient Medications  Medication Sig Dispense Refill   albuterol (VENTOLIN HFA) 108 (90 Base) MCG/ACT inhaler Inhale 1-2 puffs into the lungs every  6 (six) hours as needed for wheezing or shortness of breath.     gabapentin (NEURONTIN) 100 MG capsule Take 100 mg by mouth 3 (three) times daily. As needed per patient.     neomycin-bacitracin-polymyxin (NEOSPORIN) ointment Apply 1 application topically 2 (two) times daily as needed for wound care.     sertraline (ZOLOFT) 100 MG tablet Take 100 mg by mouth daily.     traZODone (DESYREL) 100 MG tablet Take 100 mg by mouth at bedtime as needed for sleep. As needed per patient.     dicyclomine (BENTYL) 10 MG capsule Take 1 capsule (10 mg total) by mouth 4 (four) times daily -  before meals and at bedtime. (Patient not taking: Reported on 11/29/2020) 120 capsule 1   Multiple Vitamins-Minerals (MULTIVITAMIN ADULT) CHEW Chew 1 each by mouth daily.  (Patient not taking: Reported on 11/29/2020)     No current facility-administered medications for this visit.    Allergies as of 11/29/2020 - Review Complete 11/29/2020  Allergen Reaction Noted   Geodon [ziprasidone hcl] Other (See Comments) 10/20/2014   Prednisone Hives 06/25/2011    Family History  Problem Relation Age of Onset   Cancer Father    Cancer Mother    Mental illness Mother     Social History   Socioeconomic History   Marital status: Legally Separated    Spouse name: Not on file   Number of children: Not on file   Years of education: Not on file   Highest education level: Not on file  Occupational History   Not on file  Tobacco Use   Smoking status: Every Day    Packs/day: 0.25    Years: 17.00    Pack years: 4.25    Types: Cigarettes   Smokeless tobacco: Never   Tobacco comments:    last use 1.5 years ago.  Vaping Use   Vaping Use: Never used  Substance and Sexual Activity   Alcohol use: Not Currently   Drug use: Yes    Types: Marijuana   Sexual activity: Yes    Birth control/protection: I.U.D.  Other Topics Concern   Not on file  Social History Narrative   Not on file   Social Determinants of Health    Financial Resource Strain: Not on file  Food Insecurity: Not on file  Transportation Needs: Not on file  Physical Activity: Not on file  Stress: Not on file  Social Connections: Not on file   Review of systems General: negative for malaise, night sweats, fever, chills, weight loss Neck: Negative for lumps, goiter, pain and significant neck swelling Resp: Negative for cough, wheezing, dyspnea at rest CV: Negative for chest pain, leg swelling, palpitations, orthopnea GI: denies melena, hematochezia, vomiting, constipation, dysphagia, odyonophagia, early satiety or unintentional weight loss. +diarrhea +abdominal pain, RUQ +nausea MSK: Negative for joint pain or swelling, back pain, and muscle pain. Derm: Negative for itching or rash Psych: Denies depression, anxiety, memory loss, confusion. No homicidal or suicidal ideation.  Heme: Negative for prolonged bleeding, bruising easily,  and swollen nodes. Endocrine: Negative for cold or heat intolerance, polyuria, polydipsia and goiter. Neuro: negative for tremor, gait imbalance, syncope and seizures. The remainder of the review of systems is noncontributory.  Physical Exam: BP 119/84 (BP Location: Left Arm, Patient Position: Sitting, Cuff Size: Large)   Pulse (!) 105   Temp (!) 97.5 F (36.4 C) (Oral)   Ht 5' 4"  (1.626 m)   Wt 212 lb 1.6 oz (96.2 kg)   BMI 36.41 kg/m  General:   Alert and oriented. No distress noted. Pleasant and cooperative.  Head:  Normocephalic and atraumatic. Eyes:  Conjuctiva clear without scleral icterus. Mouth:  Oral mucosa pink and moist. Good dentition. No lesions. Heart: Normal rate and rhythm, s1 and s2 heart sounds present.  Lungs: Clear lung sounds in all lobes. Respirations equal and unlabored. Abdomen:  +BS, soft, non-tender and non-distended. No rebound or guarding. No HSM or masses noted. Derm: No palmar erythema or jaundice. Multiple erythematous/purple, flat scars on abdomen Msk:  Symmetrical  without gross deformities. Normal posture. Extremities:  Without edema. Neurologic:  Alert and  oriented x4 Psych:  Alert and cooperative. Normal mood and affect.  Invalid input(s): 6 MONTHS   ASSESSMENT: Shaniquia Brafford is a 40 y.o. female with pmh  ADD, anxiety, bipolar 1, PTSD and DDD.presenting today   Diarrhea has been ongoing for over 2 years, she has had multiple evalautions without definitive findings. EGD and Colonoscopy in 2021 were unremarkable. I have a low suspicion for infectious process given chronicity of symptoms, however, she does work with animals and questions if she could have a parsitic infection. We will check stool studies as well as fecal elastase as this has not been checked in the past. Patient inquired about repeat colonoscopy, however, given how recent her last one was, we discussed that this would likely not show anything new. I will also send her Rx for Levsin Q6H to see if this helps with her abdominal discomfort/diarrhea.  She is also having some RUQ pain with nausea, bloating and belching. States that pain occurs after eating and sometimes when she is lying on her R side. We will do RUQ [place] for further evaluation of her gallbladder to r/o cholecystitis.  Given her myriad of symptoms, she likely needs to see another specialist such as rheumatology to do further testing to r/o systemic or autoimmune issues that could be causing all of her issues. She has been on multiple medications to include chemo and psychiatric meds in the past without resolution of her symptoms. 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 It is likely she could also have some underlying psychiatric influence as well, this will need to be further evaluated by PCP.   PLAN:  C diff, gi path panel, o&p, Fecal elastase 2. RUQ [place] to r/o cholecystitis 3. Rx Levsin Q6H 4. Discuss referral to specialist such as rheumatology with your PCP   Follow Up: 1 year  Rosealyn Little L. Alver Sorrow, MSN, APRN,  AGNP-C Adult-Gerontology Nurse Practitioner [facility]

## 2020-12-07 ENCOUNTER — Other Ambulatory Visit: Payer: Self-pay

## 2020-12-07 ENCOUNTER — Ambulatory Visit (HOSPITAL_COMMUNITY)
Admission: RE | Admit: 2020-12-07 | Discharge: 2020-12-07 | Disposition: A | Discharge status: Home / Self Care | Payer: Medicaid Other | Source: Ambulatory Visit | Attending: Gastroenterology | Admitting: Gastroenterology

## 2020-12-07 DIAGNOSIS — R1011 Right upper quadrant pain: Secondary | ICD-10-CM | POA: Insufficient documentation

## 2020-12-13 ENCOUNTER — Telehealth (INDEPENDENT_AMBULATORY_CARE_PROVIDER_SITE_OTHER): Payer: Self-pay

## 2020-12-13 ENCOUNTER — Other Ambulatory Visit (INDEPENDENT_AMBULATORY_CARE_PROVIDER_SITE_OTHER): Payer: Self-pay

## 2020-12-13 DIAGNOSIS — K760 Fatty (change of) liver, not elsewhere classified: Secondary | ICD-10-CM

## 2020-12-13 NOTE — Telephone Encounter (Signed)
Patient called today to see what the U/s from 12/07/2020 showed. Per Vikki Ports advise the patient that it showed mild fatty liver no evidence of gallstones. Patient will need to get labs drawn to check liver function (CMP)/ orders placed in Epic and placed at front desk for patient to pick up.Patient states she has to come to the lab ([facility]) today to drop off stool, and she was advised to come to the office and pick up the order. Patient states understanding.

## 2020-12-14 LAB — COMPREHENSIVE METABOLIC PANEL
AG Ratio: 1.8 (calc) (ref 1.0–2.5)
ALT: 17 U/L (ref 6–29)
AST: 20 U/L (ref 10–30)
Albumin: 4.2 g/dL (ref 3.6–5.1)
Alkaline phosphatase (APISO): 77 U/L (ref 31–125)
BUN: 12 mg/dL (ref 7–25)
CO2: 25 mmol/L (ref 20–32)
Calcium: 9.6 mg/dL (ref 8.6–10.2)
Chloride: 103 mmol/L (ref 98–110)
Creat: 0.73 mg/dL (ref 0.50–0.97)
Globulin: 2.4 g/dL (calc) (ref 1.9–3.7)
Glucose, Bld: 78 mg/dL (ref 65–139)
Potassium: 3.9 mmol/L (ref 3.5–5.3)
Sodium: 140 mmol/L (ref 135–146)
Total Bilirubin: 0.4 mg/dL (ref 0.2–1.2)
Total Protein: 6.6 g/dL (ref 6.1–8.1)

## 2020-12-17 LAB — OVA AND PARASITE EXAMINATION
CONCENTRATE RESULT:: NONE SEEN
MICRO NUMBER:: 12584528
SPECIMEN QUALITY:: ADEQUATE
TRICHROME RESULT:: NONE SEEN

## 2020-12-17 LAB — PANCREATIC ELASTASE, FECAL: Pancreatic Elastase-1, Stool: >500 mcg/g

## 2020-12-19 LAB — GASTROINTESTINAL PATHOGEN PANEL PCR
C. difficile Tox A/B, PCR: DETECTED — AB
Campylobacter, PCR: UNDETERMINED — AB
Cryptosporidium, PCR: UNDETERMINED — AB
E coli (ETEC) LT/ST PCR: UNDETERMINED — AB
E coli (STEC) stx1/stx2, PCR: UNDETERMINED — AB
E coli 0157, PCR: UNDETERMINED — AB
Giardia lamblia, PCR: UNDETERMINED — AB
Norovirus, PCR: UNDETERMINED — AB
Rotavirus A, PCR: UNDETERMINED — AB
Salmonella, PCR: UNDETERMINED — AB
Shigella, PCR: UNDETERMINED — AB

## 2020-12-19 LAB — C. DIFFICILE GDH AND TOXIN A/B
GDH ANTIGEN: NOT DETECTED
MICRO NUMBER:: 12610887
SPECIMEN QUALITY:: ADEQUATE
TOXIN A AND B: NOT DETECTED

## 2020-12-27 ENCOUNTER — Telehealth (INDEPENDENT_AMBULATORY_CARE_PROVIDER_SITE_OTHER): Payer: Self-pay

## 2020-12-27 NOTE — Telephone Encounter (Signed)
Patient called and wanted to know if the rest of her stools tests came in. Please advise.

## 2020-12-28 NOTE — Telephone Encounter (Signed)
I called and left a detailed message regarding labs, asked that she please return call.

## 2020-12-28 NOTE — Telephone Encounter (Signed)
Patient aware.

## 2020-12-28 NOTE — Telephone Encounter (Signed)
Spoke with the patient and she states she is some what better, still has times where she has RUQ pressure and pain also pressure and pain in lower back. She states she has occasional diarrhea and gas depending on what she eats, esp if she eats dairy.

## 2021-09-30 DIAGNOSIS — H5702 Anisocoria: Secondary | ICD-10-CM | POA: Diagnosis not present

## 2022-01-13 IMAGING — US US ABDOMEN LIMITED
1 series · 14 of 25 positions shown · non-contrast
Comparison: 11/02/2019

CLINICAL DATA: Postprandial right upper quadrant pain radiating to
back

EXAM:
ULTRASOUND ABDOMEN LIMITED RIGHT UPPER QUADRANT

[Series 1: us abdomen limited ruq (liver/gb) · 14 of 68 slices shown]
[im 1/68]
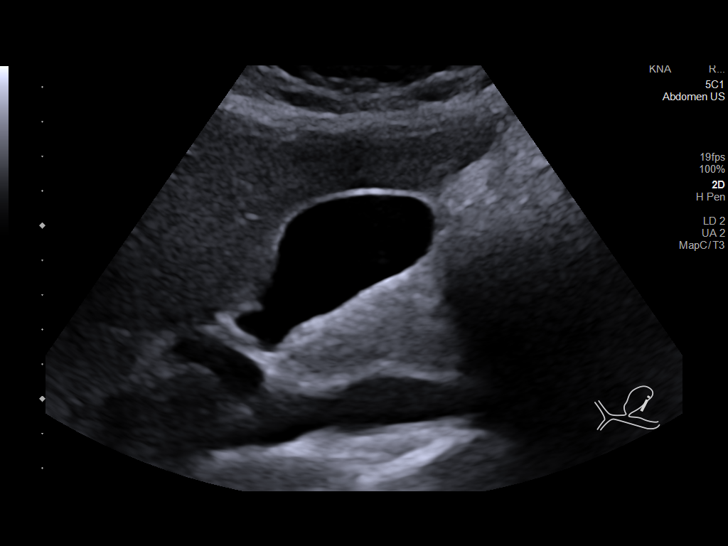
[im 6/68]
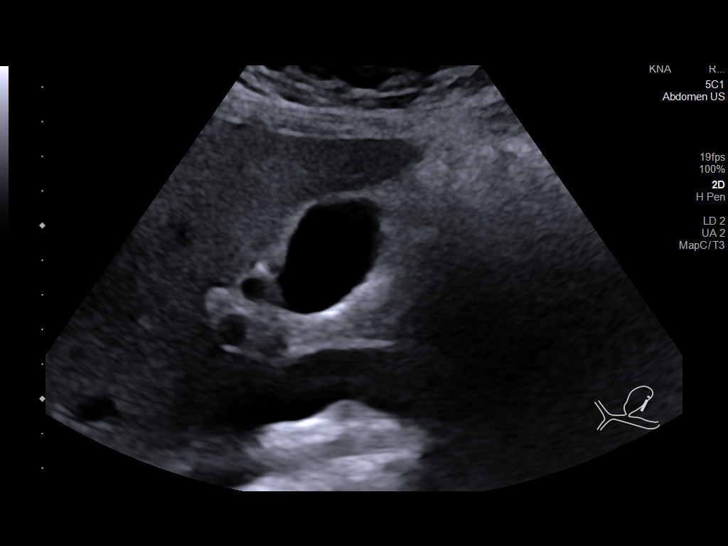
[im 12/68]
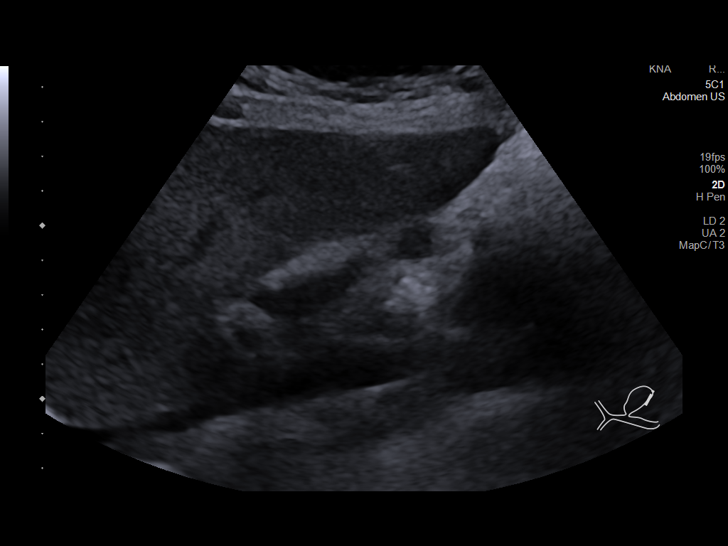
[im 17/68]
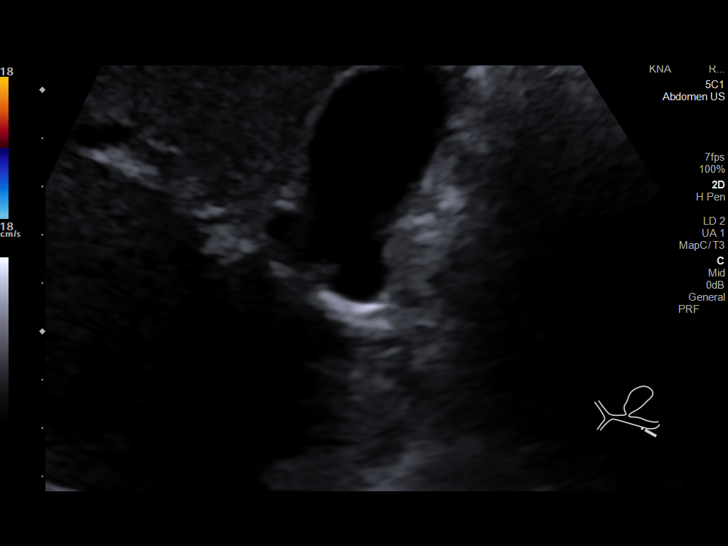
[im 23/68]
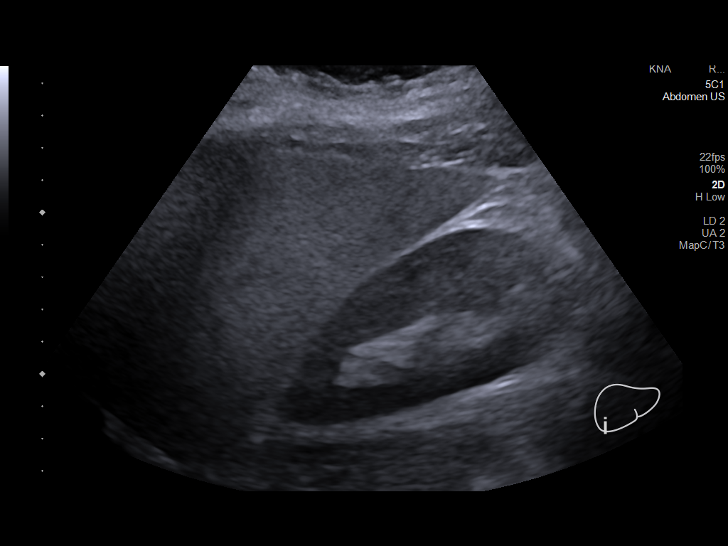
[im 26/68]
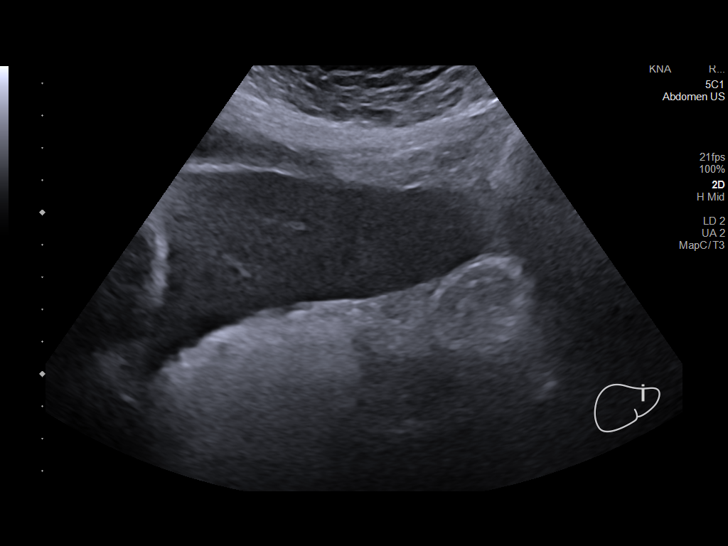
[im 31/68]
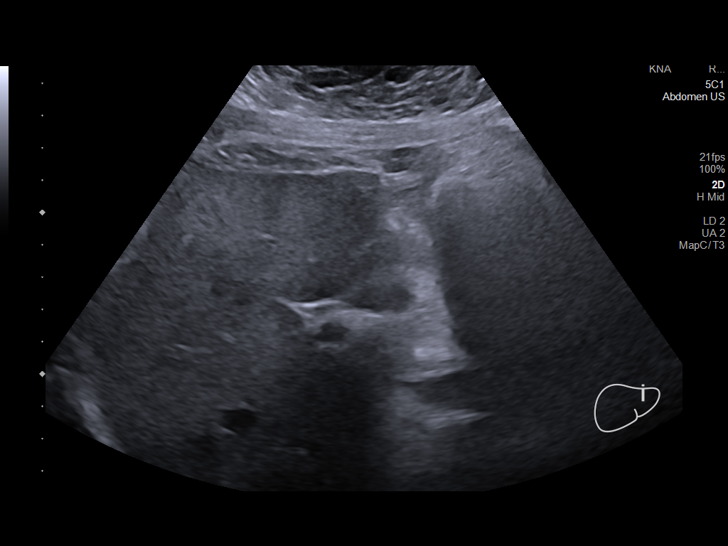
[im 37/68]
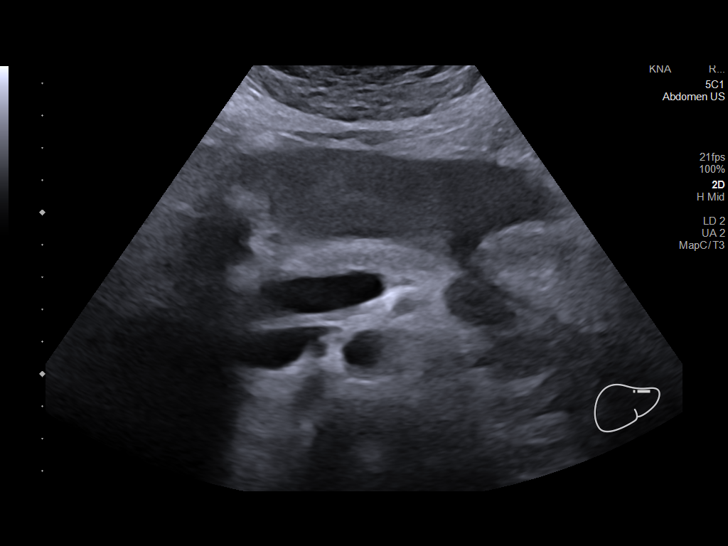
[im 42/68]
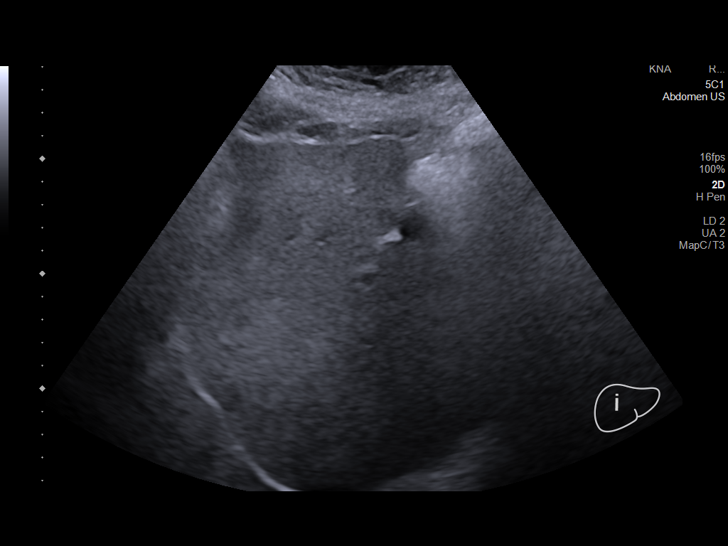
[im 45/68]
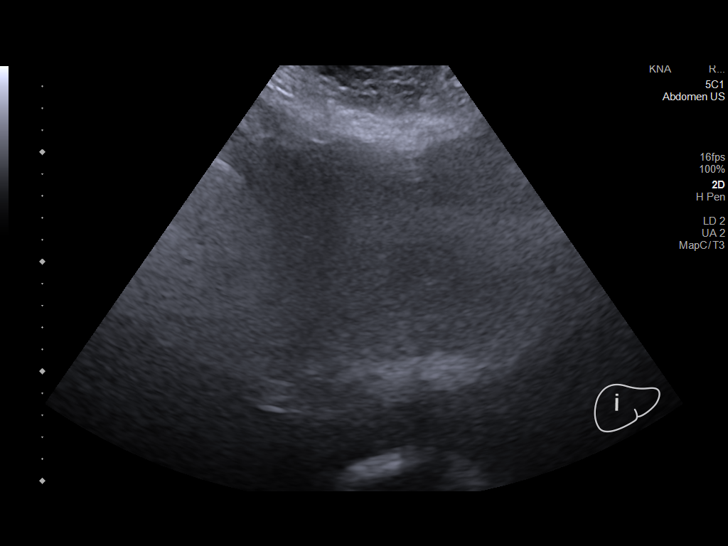
[im 51/68]
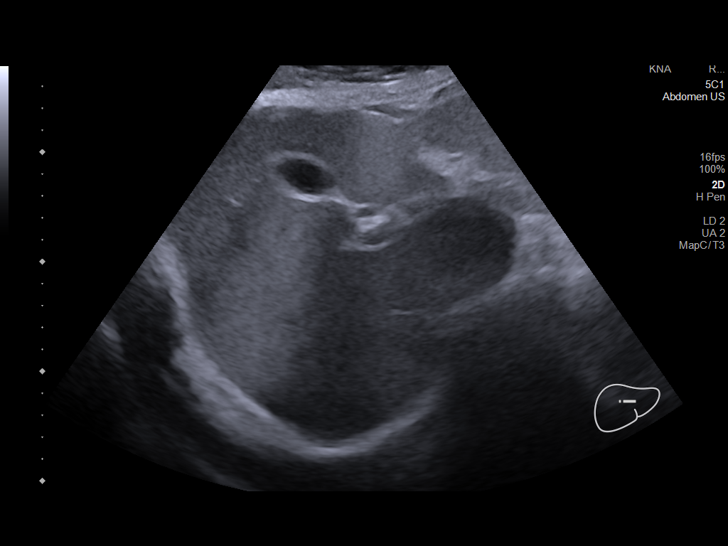
[im 56/68]
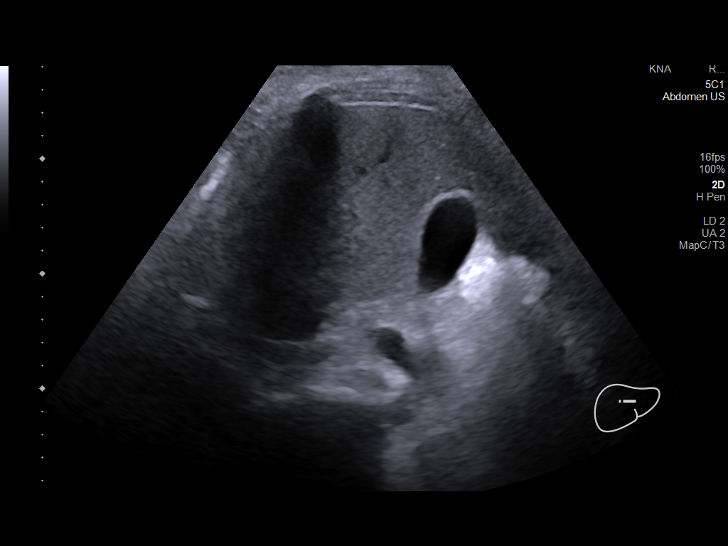
[im 62/68]
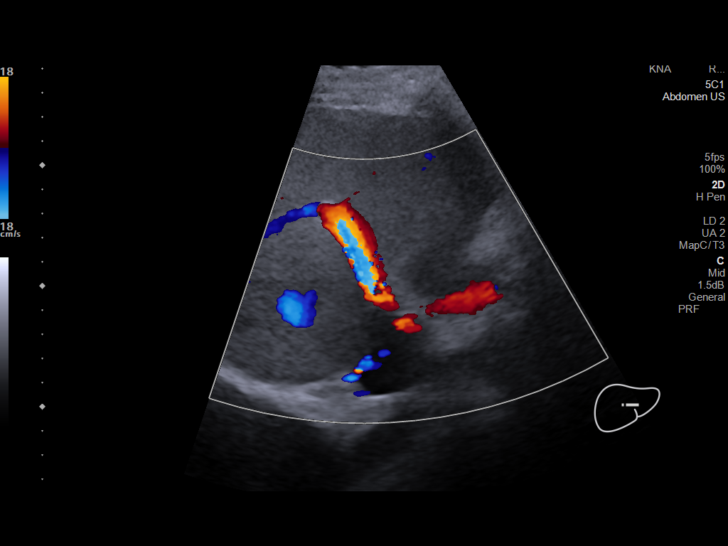
[im 68/68]
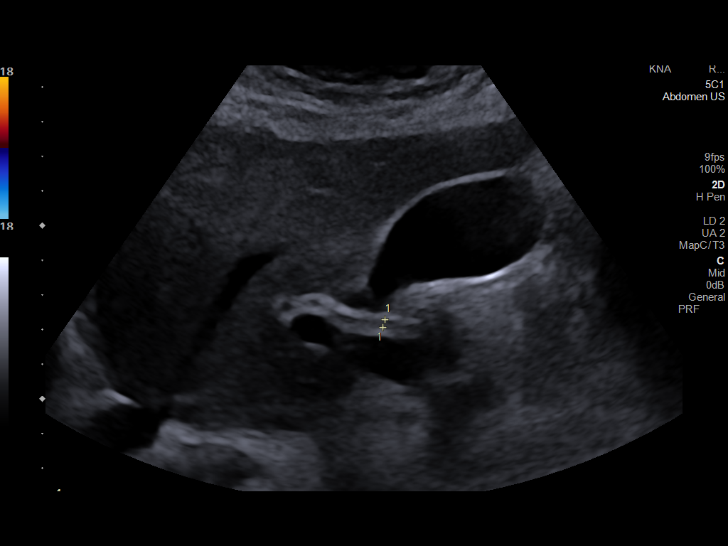

[14 of 25 positions shown; findings below may reference images not displayed]

FINDINGS: Gallbladder:

No gallstones or wall thickening visualized. No sonographic Murphy
sign noted by sonographer.

Common bile duct:

Diameter: 2 mm

Liver:

Mild increased liver echotexture compatible with hepatic steatosis.
No focal liver abnormality. Portal vein is patent on color Doppler
imaging with normal direction of blood flow towards the liver.

Other: None.
IMPRESSION: 1. Mild hepatic steatosis.
2. No evidence of cholelithiasis or cholecystitis.

## 2022-07-10 ENCOUNTER — Encounter: Payer: Medicaid Other | Admitting: Internal Medicine

## 2022-07-10 NOTE — Progress Notes (Deleted)
Office Visit Note  Patient: [name]             Date of Birth: September 24, 1980           MRN: [id number]             PCP: Toma Deiters, MD Referring: Toma Deiters, MD Visit Date: 07/10/2022 Occupation: @GUAROCC @  Subjective:  No chief complaint on file.   History of Present Illness: [name] is a 42 y.o. female ***     Activities of Daily Living:  Patient reports morning stiffness for *** {minute/hour:19697}.   Patient {ACTIONS;DENIES/REPORTS:21021675::"Denies"} nocturnal pain.  Difficulty dressing/grooming: {ACTIONS;DENIES/REPORTS:21021675::"Denies"} Difficulty climbing stairs: {ACTIONS;DENIES/REPORTS:21021675::"Denies"} Difficulty getting out of chair: {ACTIONS;DENIES/REPORTS:21021675::"Denies"} Difficulty using hands for taps, buttons, cutlery, and/or writing: {ACTIONS;DENIES/REPORTS:21021675::"Denies"}  No Rheumatology ROS completed.   PMFS History:  Patient Active Problem List   Diagnosis Date Noted   Tobacco use disorder 11/12/2016   Major depressive disorder, recurrent severe without psychotic features (HCC) 11/12/2016   MDD (major depressive disorder) 11/28/2015   Bipolar disorder, curr episode mixed, severe, w/o psychotic features (HCC) 07/18/2015   Cocaine use disorder, mild, abuse (HCC) 07/18/2015   Opioid use disorder, mild, abuse (HCC) 07/18/2015   History of ADHD 07/18/2015   BV (bacterial vaginosis)    Cannabis use disorder, mild, abuse 06/18/2014   Seizure disorder (HCC) 06/18/2014   PTSD (post-traumatic stress disorder) 06/16/2014   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) 02/13/2013    Past Medical History:  Diagnosis Date   ADD (attention deficit disorder)    ADHD (attention deficit hyperactivity disorder)    Anxiety    Arthritis    Bipolar 1 disorder (HCC)    Bipolar depression (HCC)    Degenerative disc disease    Depression    PTSD (post-traumatic stress disorder)     Family History   Problem Relation Age of Onset   Cancer Father    Cancer Mother    Mental illness Mother    Past Surgical History:  Procedure Laterality Date   BIOPSY  08/05/2019   Procedure: BIOPSY;  Surgeon: Malissa Hippo, MD;  [facility];  Service: Endoscopy;;   COLONOSCOPY     COLONOSCOPY WITH PROPOFOL N/A 08/05/2019   rehman: normal, small internal hemorrhoids   ESOPHAGOGASTRODUODENOSCOPY (EGD) WITH PROPOFOL N/A 08/05/2019   rehman: normal. LA grade A reflux esophagitis. erosive gastropathy with no stigmata of bleeding, normal duodenal bulb and second portion of duodenum.   WISDOM TOOTH EXTRACTION     Social History   Social History Narrative   Not on file   Immunization History  Administered Date(s) Administered   Influenza,inj,Quad PF,6+ Mos 02/14/2013, 11/29/2015     Objective: Vital Signs: There were no vitals taken for this visit.   Physical Exam   Musculoskeletal Exam: ***  CDAI Exam: CDAI Score: -- Patient Global: --; Provider Global: -- Swollen: --; Tender: -- Joint Exam 07/10/2022   No joint exam has been documented for this visit   There is currently no information documented on the homunculus. Go to the Rheumatology activity and complete the homunculus joint exam.  Investigation: No additional findings.  Imaging: No results found.  Recent Labs: Lab Results  Component Value Date   WBC 5.5 06/28/2019   HGB 14.4 06/28/2019   PLT 399 06/28/2019   NA 140 12/13/2020   K 3.9 12/13/2020   CL 103 12/13/2020   CO2 25 12/13/2020   GLUCOSE 78 12/13/2020   BUN 12  12/13/2020   CREATININE 0.73 12/13/2020   BILITOT 0.4 12/13/2020   ALKPHOS 52 11/11/2016   AST 20 12/13/2020   ALT 17 12/13/2020   PROT 6.6 12/13/2020   ALBUMIN 4.7 11/11/2016   CALCIUM 9.6 12/13/2020   GFRAA >60 06/28/2019    Speciality Comments: No specialty comments available.  Procedures:  No procedures performed Allergies: Geodon [ziprasidone hcl] and Prednisone    Assessment / Plan:     Visit Diagnoses: No diagnosis found.  Orders: No orders of the defined types were placed in this encounter.  No orders of the defined types were placed in this encounter.   Face-to-face time spent with patient was *** minutes. Greater than 50% of time was spent in counseling and coordination of care.  Follow-Up Instructions: No follow-ups on file.   Fuller Plan, MD  Note - This record has been created using [employer].  Chart creation errors have been sought, but may not always  have been located. Such creation errors do not reflect on  the standard of medical care.

## 2023-10-24 ENCOUNTER — Encounter (INDEPENDENT_AMBULATORY_CARE_PROVIDER_SITE_OTHER): Payer: Self-pay | Admitting: *Deleted

## 2023-11-06 ENCOUNTER — Encounter (INDEPENDENT_AMBULATORY_CARE_PROVIDER_SITE_OTHER): Payer: Self-pay | Admitting: Gastroenterology

## 2023-11-06 ENCOUNTER — Ambulatory Visit (HOSPITAL_COMMUNITY)
Admission: RE | Admit: 2023-11-06 | Discharge: 2023-11-06 | Disposition: A | Discharge status: Home / Self Care | Source: Ambulatory Visit | Attending: Gastroenterology | Admitting: Gastroenterology

## 2023-11-06 ENCOUNTER — Telehealth (INDEPENDENT_AMBULATORY_CARE_PROVIDER_SITE_OTHER): Payer: Self-pay | Admitting: Gastroenterology

## 2023-11-06 ENCOUNTER — Ambulatory Visit (INDEPENDENT_AMBULATORY_CARE_PROVIDER_SITE_OTHER): Admitting: Gastroenterology

## 2023-11-06 VITALS — BP 152/96 | HR 118 | Temp 98.4°F | Ht 65.0 in | Wt 235.9 lb

## 2023-11-06 DIAGNOSIS — R195 Other fecal abnormalities: Secondary | ICD-10-CM | POA: Diagnosis not present

## 2023-11-06 DIAGNOSIS — R197 Diarrhea, unspecified: Secondary | ICD-10-CM | POA: Diagnosis present

## 2023-11-06 DIAGNOSIS — K922 Gastrointestinal hemorrhage, unspecified: Secondary | ICD-10-CM | POA: Diagnosis not present

## 2023-11-06 DIAGNOSIS — B829 Intestinal parasitism, unspecified: Secondary | ICD-10-CM | POA: Insufficient documentation

## 2023-11-06 DIAGNOSIS — K921 Melena: Secondary | ICD-10-CM | POA: Insufficient documentation

## 2023-11-06 DIAGNOSIS — I10 Essential (primary) hypertension: Secondary | ICD-10-CM

## 2023-11-06 DIAGNOSIS — K581 Irritable bowel syndrome with constipation: Secondary | ICD-10-CM

## 2023-11-06 MED ORDER — LINACLOTIDE 145 MCG PO CAPS: 145.0000 ug | ORAL_CAPSULE | Freq: Every day | ORAL | Status: AC

## 2023-11-06 MED ORDER — PSYLLIUM 58.6 % PO PACK: 1.0000 | PACK | Freq: Two times a day (BID) | ORAL | 2 refills | Status: AC

## 2023-11-06 MED ORDER — POLYETHYLENE GLYCOL 3350 17 G PO PACK: 17.0000 g | PACK | Freq: Two times a day (BID) | ORAL | 0 refills | Status: AC

## 2023-11-06 NOTE — Telephone Encounter (Signed)
 Medication Samples have been provided to the patient.  Drug name: Linzess    Strength: 145 mcg     Qty: 2 boxes LOT: [id number]  Exp.Date: 03/26  Dosing instructions: take by mouth daily  The patient has been instructed regarding the correct time, dose, and frequency of taking this medication, including desired effects and most common side effects.   [name] 2:55 PM 11/06/2023

## 2023-11-06 NOTE — Progress Notes (Signed)
 [name] , M.D. Gastroenterology & Hepatology [facility] Gastroenterology [address] [place], [place] [address] Primary Care Physician: Orpha Yancey LABOR, MD [address] [place] [address]  Chief Complaint: Abdominal pain, bloating, parasite in the stool, hematochezia  History of Present Illness: [name] is a 43 y.o. female with ADD, anxiety, bipolar 1, PTSD  who presents for evaluation of Abdominal pain, bloating, parasite in the stool, hematochezia  Patient was last seen by [facility] in 2022 for diarrhea where she underwent upper endoscopy and colonoscopy was negative.  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  Patient reportedly was also concerned about parasite at that time as well  Patient has problems significant complaints revolving around her bowel movements which have led to losing her job.  Patient today shows me a video of her stools with apparently string like things in her stool .  Patient reports she has been seeing fresh blood in her stool.  She intermittent 3 bowel movements daily and is scared to leave her house because of severity of multiple bowel  Patient had GI PCR was detected C. difficile toxin pending indeterminate for a parasite in 2022  Last EGD:2021-abdominal pain /weight loss  - Normal hypopharynx. - Normal proximal esophagus and mid esophagus. - LA Grade A reflux esophagitis. - Erosive gastropathy with no stigmata of recent bleeding. Biopsied. - Normal duodenal bulb and second portion of the duodenum. Biopsied.  Last Colonoscopy:2021-diarrhea - The examined portion of the ileum was normal. - The entire examined colon is normal. Biopsied. - Internal hemorrhoids.  A. DUODENUM, BIOPSY:  - Benign small bowel mucosa.  - No villous blunting or increase in intraepithelial lymphocytes.  - No dysplasia or malignancy.   B. STOMACH, BIOPSY:  - Reactive gastropathy with erosions.  -  Warthin-Starry is negative for Helicobacter pylori.  - No intestinal metaplasia, dysplasia, or malignancy.   C. COLON, ASCENDING, SIGMOID, BIOPSY:  - Benign colonic mucosa.  - No active inflammation or evidence of microscopic colitis.  - No dysplasia or malignancy.   Past Medical History: Past Medical History:  Diagnosis Date   ADD (attention deficit disorder)    ADHD (attention deficit hyperactivity disorder)    Anxiety    Arthritis    Bipolar 1 disorder (HCC)    Bipolar depression (HCC)    Degenerative disc disease    Depression    PTSD (post-traumatic stress disorder)     Past Surgical History: Past Surgical History:  Procedure Laterality Date   BIOPSY  08/05/2019   Procedure: BIOPSY;  Surgeon: Golda Claudis PENNER, MD;  [facility]: [facility];  Service: Endoscopy;;   COLONOSCOPY     COLONOSCOPY WITH PROPOFOL  N/A 08/05/2019   rehman: normal, small internal hemorrhoids   ESOPHAGOGASTRODUODENOSCOPY (EGD) WITH PROPOFOL  N/A 08/05/2019   rehman: normal. LA grade A reflux esophagitis. erosive gastropathy with no stigmata of bleeding, normal duodenal bulb and second portion of duodenum.   WISDOM TOOTH EXTRACTION      Family History: Family History  Problem Relation Age of Onset   Cancer Father    Cancer Mother    Mental illness Mother     Social History: Social History   Tobacco Use  Smoking Status Former   Current packs/day: 0.25   Average packs/day: 0.3 packs/day for 17.0 years (4.3 ttl pk-yrs)   Types: Cigarettes  Smokeless Tobacco Never  Tobacco Comments   last use 1.5 years ago.   Social History  Substance and Sexual Activity  Alcohol Use Not Currently   Social History   Substance and Sexual Activity  Drug Use Not Currently   Types: Marijuana    Allergies: Allergies  Allergen Reactions   Geodon  [Ziprasidone  Hcl] Other (See Comments)    Severe shaking and jerking   Prednisone Hives    can not take steroids    Medications: Current Outpatient  Medications  Medication Sig Dispense Refill   albuterol (VENTOLIN HFA) 108 (90 Base) MCG/ACT inhaler Inhale 1-2 puffs into the lungs every 6 (six) hours as needed for wheezing or shortness of breath.     linaclotide (LINZESS) 145 MCG CAPS capsule Take 1 capsule (145 mcg total) by mouth daily before breakfast.     polyethylene glycol (MIRALAX / GLYCOLAX) 17 g packet Take 17 g by mouth 2 (two) times daily. 180 packet 0   psyllium (METAMUCIL) 58.6 % packet Take 1 packet by mouth 2 (two) times daily. 60 packet 2   No current facility-administered medications for this visit.    Review of Systems: GENERAL: negative for malaise, night sweats HEENT: No changes in hearing or vision, no nose bleeds or other nasal problems. NECK: Negative for lumps, goiter, pain and significant neck swelling RESPIRATORY: Negative for cough, wheezing CARDIOVASCULAR: Negative for chest pain, leg swelling, palpitations, orthopnea GI: SEE HPI MUSCULOSKELETAL: Negative for joint pain or swelling, back pain, and muscle pain. SKIN: Negative for lesions, rash HEMATOLOGY Negative for prolonged bleeding, bruising easily, and swollen nodes. ENDOCRINE: Negative for cold or heat intolerance, polyuria, polydipsia and goiter. NEURO: negative for tremor, gait imbalance, syncope and seizures. The remainder of the review of systems is noncontributory.   Physical Exam: BP (!) 152/96 (BP Location: Left Arm, Patient Position: Sitting, Cuff Size: Large)   Pulse (!) 118   Temp 98.4 F (36.9 C) (Temporal)   Ht 5' 5 (1.651 m)   Wt 235 lb 14.4 oz (107 kg)   BMI 39.26 kg/m  GENERAL: The patient is AO x3, in no acute distress. HEENT: Head is normocephalic and atraumatic. EOMI are intact. Mouth is well hydrated and without lesions. NECK: Supple. No masses LUNGS: Clear to auscultation. No presence of rhonchi/wheezing/rales. Adequate chest expansion HEART: RRR, normal s1 and s2. ABDOMEN: Soft, nontender, no guarding, no peritoneal  signs, and nondistended. BS +. No masses.  Imaging/Labs: as above     Latest Ref Rng & Units 06/28/2019    8:59 PM 11/11/2016   12:08 PM 10/25/2016    9:44 AM  CBC  WBC 4.0 - 10.5 K/uL 5.5  6.7  7.6   Hemoglobin 12.0 - 15.0 g/dL 85.5  85.1  84.8   Hematocrit 36.0 - 46.0 % 46.0  44.0  45.1   Platelets 150 - 400 K/uL 399  304  296    No results found for: IRON, TIBC, FERRITIN  I personally reviewed and interpreted the available labs, imaging and endoscopic files.  Impression and Plan:  Jnyah Evelyn Aguinaldo is a 43 y.o. female with ADD, anxiety, bipolar 1, PTSD  who presents for evaluation of Abdominal pain, bloating, parasite in the stool, hematochezia  #Abdominal pain,  #bloating, #parasite in the stool #hematochezia  Patient with chronic bowel complaints characterized by variable stool consistency, reported passage of string-like material (?parasite), and intermittent hematochezia.  Symptoms have led to significant functional impairment, including loss of employment and avoidance of leaving the home.  Differential diagnosis includes infectious colitis, inflammatory bowel disease (IBD), irritable bowel syndrome (IBS) with overlapping pathology, hemorrhoidal  bleeding  Since last stool sample collected was 2022 which were all indeterminate.  Will repeat workup with GI PCR and ova and parasitex3   Blood work with celiac panel, alpha gal CRP  Given continued hematochezia we will plan on ileocolonoscopy at last performed was 4 years ago  We also may be dealing with overflow diarrhea as previous imaging demonstrating significant stool burden  Will obtain abdominal x-ray if large amount is found we will give bowel purge  Linzess samples given to the patient  -May give trail of nitazoxanide and possible albendazole in future  #Hypertension  The patient was found to have elevated blood pressure when vital signs were checked in the office. The blood pressure was rechecked  by the nursing staff and it was found be persistently elevated >140/90 mmHg. I personally advised to the patient to follow up closely with PCP for hypertension control.   All questions were answered.      Oneisha Ammons Faizan Merari Pion, MD Gastroenterology and Hepatology [facility] Gastroenterology   This chart has been completed using [facility] Dictation software, and while attempts have been made to ensure accuracy , certain words and phrases may not be transcribed as intended

## 2023-11-06 NOTE — Patient Instructions (Signed)
 It was very nice to meet you today, as dicussed with will plan for the following :  1) Abdominal xray than only start linzess after   2) labs and stool samples  3) Linzess  Linzess works best when taken once a day every day, on an empty stomach, at least 30 minutes before your first meal of the day.  When Linzess is taken daily as directed:  *Constipation relief is typically felt in about a week *IBS-C patients may begin to experience relief from belly pain and overall abdominal symptoms (pain, discomfort, and bloating) in about 1 week,   with symptoms typically improving over 12 weeks.  Diarrhea, nausea or abdominal cramping may occur in the first 2 weeks -keep taking it.  These symptoms should eventually resolve. Please notify us  if having more than 4 watery bowel movements per day or fecal soiling accidents.  4)Ensure adequate fluid intake: Aim for 8 glasses of water daily. Follow a high fiber diet: Include foods such as dates, prunes, pears, and kiwi. Take Miralax and miralax daily , AFTER abdominal xray   5) Colonoscopy

## 2023-11-12 ENCOUNTER — Telehealth (INDEPENDENT_AMBULATORY_CARE_PROVIDER_SITE_OTHER): Payer: Self-pay

## 2023-11-12 ENCOUNTER — Ambulatory Visit (INDEPENDENT_AMBULATORY_CARE_PROVIDER_SITE_OTHER): Payer: Self-pay | Admitting: Gastroenterology

## 2023-11-12 MED ORDER — LINACLOTIDE 145 MCG PO CAPS: 145.0000 ug | ORAL_CAPSULE | Freq: Every day | ORAL | 1 refills | Status: DC

## 2023-11-12 NOTE — Addendum Note (Signed)
 Addended by: CINDERELLA DEATRICE SMILES on: 11/12/2023 02:41 PM   Modules accepted: Orders

## 2023-11-12 NOTE — Telephone Encounter (Signed)
 ATC patient to schedule TCS, no answer. LVM for call back.

## 2023-11-13 ENCOUNTER — Other Ambulatory Visit (INDEPENDENT_AMBULATORY_CARE_PROVIDER_SITE_OTHER): Payer: Self-pay

## 2023-11-13 DIAGNOSIS — K581 Irritable bowel syndrome with constipation: Secondary | ICD-10-CM

## 2023-11-13 MED ORDER — LINACLOTIDE 145 MCG PO CAPS: 145.0000 ug | ORAL_CAPSULE | Freq: Every day | ORAL | 1 refills | Status: AC

## 2023-11-13 NOTE — Progress Notes (Signed)
 Yes please

## 2023-12-18 NOTE — Telephone Encounter (Signed)
 LMOVM to call back to schedule colonoscopy with Dr. Cinderella, any room, ? Upt  Letter also mailed.

## 2024-01-01 ENCOUNTER — Encounter (INDEPENDENT_AMBULATORY_CARE_PROVIDER_SITE_OTHER): Payer: Self-pay | Admitting: Gastroenterology

## 2024-01-09 ENCOUNTER — Telehealth (INDEPENDENT_AMBULATORY_CARE_PROVIDER_SITE_OTHER): Payer: Self-pay | Admitting: Gastroenterology

## 2024-01-09 NOTE — Addendum Note (Signed)
 Addended by: Lucetta Baehr on: 01/09/2024 02:56 PM   Modules accepted: Orders

## 2024-01-09 NOTE — Telephone Encounter (Signed)
 Patient left voicemail stating she is trying to get her bill straight with [facility] and wanted to know if the orders would still be good. Returned call to patient. Pt states she still owes [facility] a couple hundred dollars before they will take her. I advised pt that I could switch lab orders to [facility]. Pt very appreciative of that. Will also place lab orders up front in case patient comes by to pick them up.  I was able to find all labs for [facility] except Ova and Parasite, ANA, Celiac Ab tTG DGP TIgA, and GI profice stool PCR. I printed off both sets of orders and placed up front for patient.
